# Patient Record
Sex: Female | Born: 1990
Health system: Southern US, Community
[De-identification: ages and names within clinical notes are randomized; demographics above are authoritative.]

## PROBLEM LIST (undated history)

## (undated) DIAGNOSIS — D649 Anemia, unspecified: Secondary | ICD-10-CM

## (undated) DIAGNOSIS — D509 Iron deficiency anemia, unspecified: Secondary | ICD-10-CM

## (undated) DIAGNOSIS — Z8619 Personal history of other infectious and parasitic diseases: Secondary | ICD-10-CM

## (undated) DIAGNOSIS — E611 Iron deficiency: Secondary | ICD-10-CM

## (undated) DIAGNOSIS — O1490 Unspecified pre-eclampsia, unspecified trimester: Secondary | ICD-10-CM

## (undated) DIAGNOSIS — Z87440 Personal history of urinary (tract) infections: Secondary | ICD-10-CM

## (undated) DIAGNOSIS — Z331 Pregnant state, incidental: Principal | ICD-10-CM

## (undated) DIAGNOSIS — Z8759 Personal history of other complications of pregnancy, childbirth and the puerperium: Secondary | ICD-10-CM

## (undated) DIAGNOSIS — B379 Candidiasis, unspecified: Secondary | ICD-10-CM

## (undated) DIAGNOSIS — A499 Bacterial infection, unspecified: Secondary | ICD-10-CM

## (undated) HISTORY — DX: Candidiasis, unspecified: B37.9

## (undated) HISTORY — DX: Pregnant state, incidental: Z33.1

## (undated) HISTORY — DX: Personal history of other infectious and parasitic diseases: Z86.19

## (undated) HISTORY — DX: Iron deficiency: E61.1

## (undated) HISTORY — DX: Personal history of urinary (tract) infections: Z87.440

## (undated) HISTORY — DX: Bacterial infection, unspecified: A49.9

## (undated) HISTORY — PX: WISDOM TOOTH EXTRACTION: SHX21

## (undated) HISTORY — DX: Anemia, unspecified: D64.9

## (undated) HISTORY — DX: Unspecified pre-eclampsia, unspecified trimester: O14.90

---
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
## 2008-05-25 ENCOUNTER — Emergency Department (HOSPITAL_COMMUNITY): Admission: EM | Admit: 2008-05-25 | Discharge: 2008-05-26 | Payer: Self-pay | Admitting: Emergency Medicine

## 2009-11-26 ENCOUNTER — Emergency Department (HOSPITAL_COMMUNITY): Admission: EM | Admit: 2009-11-26 | Discharge: 2009-11-26 | Payer: Self-pay | Admitting: Family Medicine

## 2011-02-07 ENCOUNTER — Emergency Department (HOSPITAL_COMMUNITY)
Admission: EM | Admit: 2011-02-07 | Discharge: 2011-02-07 | Disposition: A | Discharge status: Home / Self Care | Payer: Self-pay | Attending: Emergency Medicine | Admitting: Emergency Medicine

## 2011-02-07 ENCOUNTER — Emergency Department (HOSPITAL_COMMUNITY): Payer: Self-pay

## 2011-02-07 DIAGNOSIS — R0602 Shortness of breath: Secondary | ICD-10-CM | POA: Insufficient documentation

## 2011-02-07 DIAGNOSIS — R05 Cough: Secondary | ICD-10-CM | POA: Insufficient documentation

## 2011-02-07 DIAGNOSIS — R059 Cough, unspecified: Secondary | ICD-10-CM | POA: Insufficient documentation

## 2011-02-07 DIAGNOSIS — J40 Bronchitis, not specified as acute or chronic: Secondary | ICD-10-CM | POA: Insufficient documentation

## 2011-02-07 DIAGNOSIS — R062 Wheezing: Secondary | ICD-10-CM | POA: Insufficient documentation

## 2011-07-16 ENCOUNTER — Encounter (INDEPENDENT_AMBULATORY_CARE_PROVIDER_SITE_OTHER): Payer: Commercial Managed Care - PPO

## 2011-07-16 DIAGNOSIS — Z3201 Encounter for pregnancy test, result positive: Secondary | ICD-10-CM

## 2011-07-16 DIAGNOSIS — Z331 Pregnant state, incidental: Secondary | ICD-10-CM

## 2011-07-16 DIAGNOSIS — D649 Anemia, unspecified: Secondary | ICD-10-CM | POA: Insufficient documentation

## 2011-07-16 LAB — OB RESULTS CONSOLE ANTIBODY SCREEN: Antibody Screen: NEGATIVE

## 2011-07-16 LAB — OB RESULTS CONSOLE RUBELLA ANTIBODY, IGM: Rubella: IMMUNE

## 2011-07-16 LAB — OB RESULTS CONSOLE RPR: RPR: NONREACTIVE

## 2011-07-16 LAB — OB RESULTS CONSOLE HIV ANTIBODY (ROUTINE TESTING): HIV: NONREACTIVE

## 2011-07-16 LAB — OB RESULTS CONSOLE ABO/RH: RH Type: POSITIVE

## 2011-07-16 LAB — OB RESULTS CONSOLE HEPATITIS B SURFACE ANTIGEN: Hepatitis B Surface Ag: NEGATIVE

## 2011-07-30 ENCOUNTER — Encounter (INDEPENDENT_AMBULATORY_CARE_PROVIDER_SITE_OTHER): Payer: Commercial Managed Care - PPO | Admitting: Obstetrics and Gynecology

## 2011-07-30 ENCOUNTER — Encounter (INDEPENDENT_AMBULATORY_CARE_PROVIDER_SITE_OTHER): Payer: Commercial Managed Care - PPO

## 2011-07-30 DIAGNOSIS — O26849 Uterine size-date discrepancy, unspecified trimester: Secondary | ICD-10-CM

## 2011-07-30 DIAGNOSIS — E669 Obesity, unspecified: Secondary | ICD-10-CM

## 2011-07-30 DIAGNOSIS — Z91040 Latex allergy status: Secondary | ICD-10-CM | POA: Insufficient documentation

## 2011-07-30 DIAGNOSIS — O99019 Anemia complicating pregnancy, unspecified trimester: Secondary | ICD-10-CM

## 2011-07-30 DIAGNOSIS — O99210 Obesity complicating pregnancy, unspecified trimester: Secondary | ICD-10-CM

## 2011-08-12 ENCOUNTER — Encounter (INDEPENDENT_AMBULATORY_CARE_PROVIDER_SITE_OTHER): Payer: Commercial Managed Care - PPO | Admitting: Obstetrics and Gynecology

## 2011-08-12 DIAGNOSIS — N898 Other specified noninflammatory disorders of vagina: Secondary | ICD-10-CM

## 2011-08-12 DIAGNOSIS — Z331 Pregnant state, incidental: Secondary | ICD-10-CM

## 2011-08-12 LAB — GC/CHLAMYDIA PROBE AMP, GENITAL
Chlamydia: NEGATIVE
Gonorrhea: NEGATIVE

## 2011-08-12 LAB — PAP IG, CT-NG, RFX HPV ASCU: Pap: NEGATIVE

## 2011-09-04 DIAGNOSIS — R638 Other symptoms and signs concerning food and fluid intake: Secondary | ICD-10-CM

## 2011-09-04 DIAGNOSIS — Z91040 Latex allergy status: Secondary | ICD-10-CM

## 2011-09-04 DIAGNOSIS — D649 Anemia, unspecified: Secondary | ICD-10-CM

## 2011-09-05 ENCOUNTER — Ambulatory Visit (INDEPENDENT_AMBULATORY_CARE_PROVIDER_SITE_OTHER): Payer: Commercial Managed Care - PPO | Admitting: Obstetrics and Gynecology

## 2011-09-05 ENCOUNTER — Other Ambulatory Visit: Payer: Commercial Managed Care - PPO

## 2011-09-05 ENCOUNTER — Encounter: Payer: Self-pay | Admitting: Obstetrics and Gynecology

## 2011-09-05 VITALS — BP 100/62 | Ht 62.0 in | Wt 208.0 lb

## 2011-09-05 DIAGNOSIS — E049 Nontoxic goiter, unspecified: Secondary | ICD-10-CM

## 2011-09-05 DIAGNOSIS — Z331 Pregnant state, incidental: Secondary | ICD-10-CM

## 2011-09-05 LAB — TSH: TSH: 1.738 u[IU]/mL (ref 0.350–4.500)

## 2011-09-05 NOTE — Progress Notes (Signed)
Chloe Graves [redacted]w[redacted]d   Doing well No ctx, VB or LOF  Plan: rv'd quad screen option, pt will decide after anat [place] 2wks anat [place] and early 1hr gtt NV

## 2011-09-05 NOTE — Progress Notes (Signed)
Pt c/o swelling in feet and pelvic pain.

## 2011-09-05 NOTE — Patient Instructions (Signed)
Morning Sickness Morning sickness is when you feel sick to your stomach (nauseous) during pregnancy. This nauseous feeling may or may not come with throwing up (vomiting). It often occurs in the morning, but can be a problem any time of day. While morning sickness is unpleasant, it is usually harmless unless you develop severe and continual vomiting (hyperemesis gravidarum). This condition requires more intense treatment. CAUSES  The cause of morning sickness is not completely known but seems to be related to a sudden increase of two hormones:   Human chorionic gonadotropin (hCG).   Estrogen hormone.  These are elevated in the first part of the pregnancy. TREATMENT  Do not use any medicines (prescription, over-the-counter, or herbal) for morning sickness without first talking to your caregiver. Some patients are helped by the following:  Vitamin B6 (25mg  every 8 hours) or vitamin B6 shots.   An antihistamine called doxylamine (10mg  every 8 hours).   The herbal medication ginger.  HOME CARE INSTRUCTIONS   Taking multivitamins before getting pregnant can prevent or decrease the severity of morning sickness in most women.   Eat a piece of dry toast or unsalted crackers before getting out of bed in the morning.   Eat 5 or 6 small meals a day.   Eat dry and bland foods (rice, baked potato).   Do not drink liquids with your meals. Drink liquids between meals.   Avoid greasy, fatty, and spicy foods.   Get someone to cook for you if the smell of any food causes nausea and vomiting.   Avoid vitamin pills with iron because iron can cause nausea.   Snack on protein foods between meals if you are hungry.   Eat unsweetened gelatins for deserts.   Wear an acupressure wristband (worn for sea sickness) may be helpful.   Acupuncture may be helpful.   Do not smoke.   Get a humidifier to keep the air in your house free of odors.  SEEK MEDICAL CARE IF:   Your home remedies are not working  and you need medication.   You feel dizzy or lightheaded.   You are losing weight.   You need help with your diet.  SEEK IMMEDIATE MEDICAL CARE IF:   You have persistent and uncontrolled nausea and vomiting.   You pass out (faint).   You have a fever.  MAKE SURE YOU:   Understand these instructions.   Will watch your condition.   Will get help right away if you are not doing well or get worse.  Document Released: 06/26/2006 Document Revised: 04/24/2011 Document Reviewed: 04/23/2007 [facility] Patient Information 2012 [place], [place].Preventing Preterm Labor Preterm labor is when a pregnant woman has contractions that cause the cervix to open, shorten, and thin before 37 weeks of pregnancy. You will have regular contractions (tightening) 2 to 3 minutes apart. This usually causes discomfort or pain. HOME CARE  Eat a healthy diet.   Take your vitamins as told by your doctor.   Drink enough fluids to keep your pee (urine) clear or pale yellow every day.   Get rest and sleep.   Do not have sex if you are at high risk for preterm labor.   Follow your doctor's advice about activity, medicines, and tests.   Avoid stress.   Avoid hard labor or exercise that lasts for a long time.   Do not smoke.  GET HELP RIGHT AWAY IF:   You are having contractions.   You have belly (abdominal) pain.   You  have bleeding from your vagina.   You have pain when you pee (urinate).   You have abnormal discharge from your vagina.   You have a temperature by mouth above 102 F (38.9 C).  MAKE SURE YOU:  Understand these instructions.   Will watch your condition.   Will get help if you are not doing well or get worse.  Document Released: 08/01/2008 Document Revised: 04/24/2011 Document Reviewed: 08/01/2008 [facility] Patient Information 2012 [place], [place].

## 2011-09-22 ENCOUNTER — Other Ambulatory Visit: Payer: Self-pay | Admitting: Obstetrics and Gynecology

## 2011-09-22 ENCOUNTER — Ambulatory Visit (INDEPENDENT_AMBULATORY_CARE_PROVIDER_SITE_OTHER): Payer: Commercial Managed Care - PPO | Admitting: Obstetrics and Gynecology

## 2011-09-22 ENCOUNTER — Ambulatory Visit (INDEPENDENT_AMBULATORY_CARE_PROVIDER_SITE_OTHER): Payer: Commercial Managed Care - PPO

## 2011-09-22 ENCOUNTER — Encounter: Payer: Self-pay | Admitting: Obstetrics and Gynecology

## 2011-09-22 VITALS — BP 110/70 | Wt 211.0 lb

## 2011-09-22 DIAGNOSIS — Z331 Pregnant state, incidental: Secondary | ICD-10-CM

## 2011-09-22 DIAGNOSIS — O26849 Uterine size-date discrepancy, unspecified trimester: Secondary | ICD-10-CM

## 2011-09-22 LAB — US OB COMP + 14 WK

## 2011-09-22 LAB — GLUCOSE TOLERANCE, 1 HOUR: Glucose, 1 Hour GTT: 110 mg/dL (ref 70–140)

## 2011-09-22 NOTE — Progress Notes (Signed)
1 gtt given today without difficulty; pt declines quad screen

## 2011-09-22 NOTE — Progress Notes (Signed)
Patient ID: Chloe Graves, female   DOB: April 24, 1991, 21 y.o.   MRN: [id number] Declines quad, has 1 gtt today US anatomy WNL placenta anterior AFI WNL cervix 3.62 Lavera Guise, CNM

## 2011-09-22 NOTE — Progress Notes (Signed)
Addended by: Janeece Agee on: 09/22/2011 09:51 AM   Modules accepted: Orders

## 2011-10-10 ENCOUNTER — Ambulatory Visit (INDEPENDENT_AMBULATORY_CARE_PROVIDER_SITE_OTHER): Payer: Commercial Managed Care - PPO | Admitting: Obstetrics and Gynecology

## 2011-10-10 ENCOUNTER — Encounter: Payer: Self-pay | Admitting: Obstetrics and Gynecology

## 2011-10-10 VITALS — BP 100/60 | Wt 214.0 lb

## 2011-10-10 DIAGNOSIS — Z331 Pregnant state, incidental: Secondary | ICD-10-CM

## 2011-10-10 NOTE — Patient Instructions (Signed)
Preventing Preterm Labor Preterm labor is when a pregnant woman has contractions that cause the cervix to open, shorten, and thin before 37 weeks of pregnancy. You will have regular contractions (tightening) 2 to 3 minutes apart. This usually causes discomfort or pain. HOME CARE  Eat a healthy diet.   Take your vitamins as told by your doctor.   Drink enough fluids to keep your pee (urine) clear or pale yellow every day.   Get rest and sleep.   Do not have sex if you are at high risk for preterm labor.   Follow your doctor's advice about activity, medicines, and tests.   Avoid stress.   Avoid hard labor or exercise that lasts for a long time.   Do not smoke.  GET HELP RIGHT AWAY IF:   You are having contractions.   You have belly (abdominal) pain.   You have bleeding from your vagina.   You have pain when you pee (urinate).   You have abnormal discharge from your vagina.   You have a temperature by mouth above 102 F (38.9 C).  MAKE SURE YOU:  Understand these instructions.   Will watch your condition.   Will get help if you are not doing well or get worse.  Document Released: 08/01/2008 Document Revised: 04/24/2011 Document Reviewed: 08/01/2008 [facility] Patient Information 2012 [place], [place].High Protein Diet A high protein diet means that high protein foods are added to your diet. Getting more protein in the diet is important for a number of reasons. Protein helps the body to build tissue, muscle, and to repair damage. People who have had surgery, injuries such as broken bones, infections, and burns, or illnesses such as cancer, may need more protein in their diet.  SERVING SIZES Measuring foods and serving sizes helps to make sure you are getting the right amount of food. The list below tells how big or small some common serving sizes are.   1 oz.........4 stacked dice.   3 oz........Marland KitchenDeck of cards.   1 tsp.......Marland KitchenTip of little finger.   1 tbs......Marland KitchenMarland KitchenThumb.     2 tbs.......Marland KitchenGolf ball.    cup......Marland KitchenHalf of a fist.   1 cup.......Marland KitchenA fist.  FOOD SOURCES OF PROTEIN Listed below are some food sources of protein and the amount of protein they contain. Your Registered Dietitian can calculate how many grams of protein you need for your medical condition. High protein foods can be added to the diet at mealtime or as snacks. Be sure to have at least 1 protein-containing food at each meal and snack to ensure adequate intake.  Meats and Meat Substitutes / Protein (g)  3 oz poultry (chicken, [place]) / 26 g   3 oz tuna, canned in water / 26 g   3 oz fish (cod) / 21 g   3 oz red meat (beef, pork) / 21 g   4 oz tofu / 9 g   1 egg / 6 g    cup egg substitute / 5 g   1 cup dried beans / 15 g   1 cup soy milk / 4 g  Dairy / Protein (g)  1 cup milk (skim, 1%, 2%, whole) / 8 g    cup evaporated milk / 9 g   1 cup buttermilk / 8 g   1 cup low-fat plain yogurt / 11 g   1 cup regular plain yogurt / 9 g    cup cottage cheese / 14 g   1 oz cheddar cheese /  7 g  Nuts / Protein (g)  2 tbs peanut butter / 8 g   1 oz peanuts / 7 g   2 tbs cashews / 5 g   2 tbs almonds / 5 g  Document Released: 05/05/2005 Document Revised: 04/24/2011 Document Reviewed: 02/05/2007 [facility] Patient Information 2012 [place], [place].

## 2011-10-10 NOTE — Progress Notes (Signed)
[redacted]w[redacted]d rv'd comfort measures - rec maternity belt, works at [employer], empty bladder rv'd diet - regular protein intake PTL precautions Taking CBE and BF classes  1hr gtt NV

## 2011-10-10 NOTE — Progress Notes (Signed)
C/o increased low abd. 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

## 2011-10-24 ENCOUNTER — Telehealth: Payer: Self-pay | Admitting: Obstetrics and Gynecology

## 2011-10-24 NOTE — Telephone Encounter (Signed)
Triage/epic 

## 2011-11-05 ENCOUNTER — Ambulatory Visit (INDEPENDENT_AMBULATORY_CARE_PROVIDER_SITE_OTHER): Payer: 59 | Admitting: Obstetrics and Gynecology

## 2011-11-05 ENCOUNTER — Encounter: Payer: Self-pay | Admitting: Obstetrics and Gynecology

## 2011-11-05 VITALS — BP 100/70 | Temp 98.1°F | Wt 226.0 lb

## 2011-11-05 DIAGNOSIS — O2340 Unspecified infection of urinary tract in pregnancy, unspecified trimester: Secondary | ICD-10-CM

## 2011-11-05 DIAGNOSIS — Z331 Pregnant state, incidental: Secondary | ICD-10-CM

## 2011-11-05 DIAGNOSIS — O2390 Unspecified genitourinary tract infection in pregnancy, unspecified trimester: Secondary | ICD-10-CM

## 2011-11-05 DIAGNOSIS — N390 Urinary tract infection, site not specified: Secondary | ICD-10-CM

## 2011-11-05 LAB — POCT URINALYSIS DIPSTICK
Bilirubin, UA: 1
Blood, UA: NEGATIVE
Glucose, UA: NEGATIVE
Ketones, UA: NEGATIVE
Nitrite, UA: NEGATIVE
Protein, UA: NEGATIVE
Spec Grav, UA: 1.01
Urobilinogen, UA: NEGATIVE
pH, UA: 8

## 2011-11-05 LAB — OB RESULTS CONSOLE GBS: GBS: NEGATIVE

## 2011-11-05 NOTE — Addendum Note (Signed)
Addended by: Tim Lair on: 11/05/2011 10:39 AM   Modules accepted: Orders

## 2011-11-05 NOTE — Progress Notes (Signed)
C/O dizziness, spotted & blurred vision Pt still c/o lower back pain (provided "Easing back pain during pregnancy" pamphlet)

## 2011-11-05 NOTE — Progress Notes (Signed)
Patient ID: Chloe Graves, female   DOB: September 08, 1990, 21 y.o.   MRN: [id number] discussed common discomforts of pg, excessive weight gain, nutrition, and exercise, Reviewed s/s preterm labor, srom, vag bleeding, kick counts to report, enc 8 water daily and frequent voids Lavera Guise, CNM

## 2011-11-05 NOTE — Progress Notes (Signed)
Per Lavera Guise pt needs a 3hr GTT. And wants orders canceled for today 1 GTT.

## 2011-11-06 ENCOUNTER — Telehealth: Payer: Self-pay

## 2011-11-06 LAB — CULTURE, OB URINE
Colony Count: NO GROWTH
Organism ID, Bacteria: NO GROWTH

## 2011-11-07 ENCOUNTER — Telehealth: Payer: Self-pay

## 2011-11-07 ENCOUNTER — Other Ambulatory Visit: Payer: 59

## 2011-11-07 NOTE — Telephone Encounter (Signed)
Pt was called to be made aware that per Vickie L she would not have to do a 3 hr GTT.   And only do a 1hr GTT Pt made me aware she did Glucola early on 09-22-11  Final Result was 110. I told pt I would confirm w/ vickie/ TG if she needed to repeat 1 GTT. Pt home number and boyfriends number are not reachable at this time. Pt can be reached at work at [phone].

## 2011-11-13 ENCOUNTER — Inpatient Hospital Stay (HOSPITAL_COMMUNITY): Payer: 59

## 2011-11-13 ENCOUNTER — Other Ambulatory Visit: Payer: Self-pay | Admitting: Obstetrics and Gynecology

## 2011-11-13 ENCOUNTER — Telehealth: Payer: Self-pay | Admitting: Obstetrics and Gynecology

## 2011-11-13 ENCOUNTER — Ambulatory Visit (INDEPENDENT_AMBULATORY_CARE_PROVIDER_SITE_OTHER): Payer: 59 | Admitting: Obstetrics and Gynecology

## 2011-11-13 ENCOUNTER — Inpatient Hospital Stay (HOSPITAL_COMMUNITY)
Admission: AD | Admit: 2011-11-13 | Discharge: 2011-11-20 | DRG: 765 | Disposition: A | Discharge status: Home / Self Care | Payer: 59 | Source: Ambulatory Visit | Attending: Obstetrics and Gynecology | Admitting: Obstetrics and Gynecology

## 2011-11-13 ENCOUNTER — Encounter (HOSPITAL_COMMUNITY): Payer: Self-pay | Admitting: *Deleted

## 2011-11-13 VITALS — BP 140/90 | Wt 227.0 lb

## 2011-11-13 DIAGNOSIS — O365990 Maternal care for other known or suspected poor fetal growth, unspecified trimester, not applicable or unspecified: Secondary | ICD-10-CM | POA: Diagnosis present

## 2011-11-13 DIAGNOSIS — O139 Gestational [pregnancy-induced] hypertension without significant proteinuria, unspecified trimester: Secondary | ICD-10-CM | POA: Diagnosis present

## 2011-11-13 DIAGNOSIS — O9902 Anemia complicating childbirth: Secondary | ICD-10-CM | POA: Diagnosis present

## 2011-11-13 DIAGNOSIS — Z98891 History of uterine scar from previous surgery: Secondary | ICD-10-CM

## 2011-11-13 DIAGNOSIS — O368190 Decreased fetal movements, unspecified trimester, not applicable or unspecified: Secondary | ICD-10-CM

## 2011-11-13 DIAGNOSIS — Z364 Encounter for antenatal screening for fetal growth retardation: Secondary | ICD-10-CM

## 2011-11-13 DIAGNOSIS — D649 Anemia, unspecified: Secondary | ICD-10-CM | POA: Diagnosis present

## 2011-11-13 DIAGNOSIS — Z331 Pregnant state, incidental: Secondary | ICD-10-CM

## 2011-11-13 DIAGNOSIS — O1414 Severe pre-eclampsia complicating childbirth: Secondary | ICD-10-CM | POA: Diagnosis present

## 2011-11-13 DIAGNOSIS — O1415 Severe pre-eclampsia, complicating the puerperium: Secondary | ICD-10-CM | POA: Diagnosis not present

## 2011-11-13 DIAGNOSIS — O1490 Unspecified pre-eclampsia, unspecified trimester: Secondary | ICD-10-CM | POA: Diagnosis present

## 2011-11-13 DIAGNOSIS — O4100X0 Oligohydramnios, unspecified trimester, not applicable or unspecified: Principal | ICD-10-CM | POA: Diagnosis present

## 2011-11-13 LAB — URINALYSIS, ROUTINE W REFLEX MICROSCOPIC
Bilirubin Urine: NEGATIVE
Glucose, UA: NEGATIVE mg/dL
Hgb urine dipstick: NEGATIVE
Ketones, ur: NEGATIVE mg/dL
Leukocytes, UA: NEGATIVE
Nitrite: NEGATIVE
Protein, ur: NEGATIVE mg/dL
Specific Gravity, Urine: <1.005 — ABNORMAL LOW (ref 1.005–1.030)
Urobilinogen, UA: 0.2 mg/dL (ref 0.0–1.0)
pH: 6 (ref 5.0–8.0)

## 2011-11-13 LAB — COMPREHENSIVE METABOLIC PANEL
ALT: 11 U/L (ref 0–35)
AST: 16 U/L (ref 0–37)
Albumin: 2.5 g/dL — ABNORMAL LOW (ref 3.5–5.2)
Alkaline Phosphatase: 121 U/L — ABNORMAL HIGH (ref 39–117)
BUN: 7 mg/dL (ref 6–23)
CO2: 23 mEq/L (ref 19–32)
Calcium: 8.9 mg/dL (ref 8.4–10.5)
Chloride: 104 mEq/L (ref 96–112)
Creatinine, Ser: 0.8 mg/dL (ref 0.50–1.10)
GFR calc Af Amer: >90 mL/min (ref >90)
GFR calc non Af Amer: >90 mL/min (ref >90)
Glucose, Bld: 82 mg/dL (ref 70–99)
Potassium: 3.8 mEq/L (ref 3.5–5.1)
Sodium: 135 mEq/L (ref 135–145)
Total Bilirubin: 0.1 mg/dL — ABNORMAL LOW (ref 0.3–1.2)
Total Protein: 6.4 g/dL (ref 6.0–8.3)

## 2011-11-13 LAB — CBC
HCT: 35.7 % — ABNORMAL LOW (ref 36.0–46.0)
Hemoglobin: 11.7 g/dL — ABNORMAL LOW (ref 12.0–15.0)
MCH: 26.1 pg (ref 26.0–34.0)
MCHC: 32.8 g/dL (ref 30.0–36.0)
MCV: 79.5 fL (ref 78.0–100.0)
Platelets: 299 10*3/uL (ref 150–400)
RBC: 4.49 MIL/uL (ref 3.87–5.11)
RDW: 15.5 % (ref 11.5–15.5)
WBC: 9.1 10*3/uL (ref 4.0–10.5)

## 2011-11-13 LAB — AMNISURE RUPTURE OF MEMBRANE (ROM) NOT AT ARMC: Amnisure ROM: NEGATIVE

## 2011-11-13 LAB — URIC ACID: Uric Acid, Serum: 6.4 mg/dL (ref 2.4–7.0)

## 2011-11-13 LAB — LACTATE DEHYDROGENASE: LDH: 210 U/L (ref 94–250)

## 2011-11-13 MED ORDER — CALCIUM CARBONATE ANTACID 500 MG PO CHEW: 2.0000 | CHEWABLE_TABLET | ORAL | Status: DC | PRN

## 2011-11-13 MED ORDER — PRENATAL MULTIVITAMIN CH
1.0000 | ORAL_TABLET | Freq: Every day | ORAL | Status: DC
  Administered 2011-11-14 – 2011-11-15 (×2): 1 via ORAL
  Filled 2011-11-13: qty 1

## 2011-11-13 MED ORDER — ACETAMINOPHEN 325 MG PO TABS
650.0000 mg | ORAL_TABLET | ORAL | Status: DC | PRN
  Administered 2011-11-14 – 2011-11-16 (×5): 650 mg via ORAL
  Filled 2011-11-13 (×7): qty 2

## 2011-11-13 MED ORDER — BETAMETHASONE SOD PHOS & ACET 6 (3-3) MG/ML IJ SUSP
12.0000 mg | INTRAMUSCULAR | Status: AC
  Administered 2011-11-13 – 2011-11-14 (×2): 12 mg via INTRAMUSCULAR
  Filled 2011-11-13 (×2): qty 2

## 2011-11-13 MED ORDER — LACTATED RINGERS IV SOLN
INTRAVENOUS | Status: DC
  Administered 2011-11-13 – 2011-11-14 (×4): via INTRAVENOUS
  Administered 2011-11-15: 125 mL/h via INTRAVENOUS
  Administered 2011-11-15 – 2011-11-16 (×2): via INTRAVENOUS

## 2011-11-13 MED ORDER — DOCUSATE SODIUM 100 MG PO CAPS
100.0000 mg | ORAL_CAPSULE | Freq: Every day | ORAL | Status: DC
  Administered 2011-11-14 – 2011-11-15 (×2): 100 mg via ORAL
  Filled 2011-11-13: qty 1

## 2011-11-13 MED ORDER — ZOLPIDEM TARTRATE 5 MG PO TABS
5.0000 mg | ORAL_TABLET | Freq: Every evening | ORAL | Status: DC | PRN
  Administered 2011-11-13 – 2011-11-15 (×3): 5 mg via ORAL
  Filled 2011-11-13 (×3): qty 1

## 2011-11-13 NOTE — Progress Notes (Signed)
Pt has not felt baby move since 1:40am. C/O:Lower back Pain, headaches. Repeat 1 GTT today.

## 2011-11-13 NOTE — H&P (Signed)
Chloe Graves is a 21 y.o. female, G2P0010 at 51 weeks, presenting for admission due to gestational hypertension, IUGR vs SGA, and oligohydramnios.  Seen at [facility] today for decreased FM, with non-reactive NST and BP 140/90. In MAU, NST remained non-reactive, with BPP 6/8 (no FBM), AFI 9.26, 5%ile, and EFW 690gm, 1+8, 13%ile.  Cervix 3.25.  PIH labs were essentially WNL, but uric acid 6.4.  UA negative for protein.  She denies any leaking, contractions, or any other obstetrical issue.  She is admitted for continuous EFM, betamethasone course, repeat BPP/AFI with dopplers, IV fluids, 24 hour urine, and BP monitoring.  History of present pregnancy: Patient entered care at 37 6/7 weeks.  EDC of 02/12/12 was established by 11 week [place].  She declined genetic screenings.  Anatomy scan was done at 18 weeks, with normal findings and an anterior placenta.  Her prenatal course has been essentially uncomplicated.     Pregnancy remarkable for: Patient Active Problem List  Diagnosis  . Latex allergy  . Increased BMI 37.7   . Anemia  . Normal pregnancy, incidental  . Gestational hypertension  . SGA (small for gestational age)  . Oligohydramnios     OB History    Grav Para Term Preterm Abortions TAB SAB Ect Mult Living   2    1         #1--2012 TAB  #2--Current  Past Medical History  Diagnosis Date  . H/O candidiasis   . H/O varicella   . H/O rubella   . H/O cystitis   . Low iron   . Bacterial infection   . Yeast infection   . Anemia   . Normal pregnancy, incidental 09/22/2011  Some issues with constipation vs IBS--no clear diagnosis.  Past Surgical History  Procedure Date  . Wisdom tooth extraction    Family History: family history includes Cancer in her maternal aunt and maternal grandmother.MA seizures.  Cousin lupus.  MA breast cancer, MGF leukemia.    Social History:  reports that she quit smoking about 5 months ago. She has never used smokeless tobacco. She reports that she does not  drink alcohol or use illicit drugs. FOB is involved and supportive--Marquise Klus.  Patient is college-educated, of the [place] faith, African-American, and employed as a Psychologist, sport and exercise at [employer].  FOB has his GED and is employed in house maintenance.  Allergies:  LATEX--itching  ROS:  No HA, occasional visual spotting.  Positive FM.   Blood pressure 145/90, pulse 61, temperature 98.7 F (37.1 C), temperature source Oral, resp. rate 20, height 5\' 1"  (1.549 m), weight 227 lb 9.6 oz (103.239 kg).  Filed Vitals:   11/13/11 1618 11/13/11 1633 11/13/11 1734 11/13/11 1748  BP: 146/80 124/76 148/90 145/90  Pulse: 63 65 63 61  Temp:      TempSrc:      Resp:      Height:      Weight:         Chest clear Heart RRR without murmur Abd gravid, NT, FH approx 26 cm Pelvic--deferred except for amnisure Ext--DTR 1+, trace/1+ bilaterally  FHR--non-reactive, baseline 150-155, no decels No contractions.  Results for orders placed during the hospital encounter of 11/13/11 (from the past 24 hour(s))  URINALYSIS, ROUTINE W REFLEX MICROSCOPIC     Status: Abnormal   Collection Time   11/13/11  3:50 PM      Component Value Range   Color, Urine STRAW (*) YELLOW   APPearance CLEAR  CLEAR   Specific  Gravity, Urine <1.005 (*) 1.005 - 1.030   pH 6.0  5.0 - 8.0   Glucose, UA NEGATIVE  NEGATIVE mg/dL   Hgb urine dipstick NEGATIVE  NEGATIVE   Bilirubin Urine NEGATIVE  NEGATIVE   Ketones, ur NEGATIVE  NEGATIVE mg/dL   Protein, ur NEGATIVE  NEGATIVE mg/dL   Urobilinogen, UA 0.2  0.0 - 1.0 mg/dL   Nitrite NEGATIVE  NEGATIVE   Leukocytes, UA NEGATIVE  NEGATIVE  CBC     Status: Abnormal   Collection Time   11/13/11  3:59 PM      Component Value Range   WBC 9.1  4.0 - 10.5 K/uL   RBC 4.49  3.87 - 5.11 MIL/uL   Hemoglobin 11.7 (*) 12.0 - 15.0 g/dL   HCT 21.3 (*) 08.6 - 57.8 %   MCV 79.5  78.0 - 100.0 fL   MCH 26.1  26.0 - 34.0 pg   MCHC 32.8  30.0 - 36.0 g/dL   RDW 46.9  62.9 - 52.8 %   Platelets 299   150 - 400 K/uL  COMPREHENSIVE METABOLIC PANEL     Status: Abnormal   Collection Time   11/13/11  3:59 PM      Component Value Range   Sodium 135  135 - 145 mEq/L   Potassium 3.8  3.5 - 5.1 mEq/L   Chloride 104  96 - 112 mEq/L   CO2 23  19 - 32 mEq/L   Glucose, Bld 82  70 - 99 mg/dL   BUN 7  6 - 23 mg/dL   Creatinine, Ser 4.13  0.50 - 1.10 mg/dL   Calcium 8.9  8.4 - 24.4 mg/dL   Total Protein 6.4  6.0 - 8.3 g/dL   Albumin 2.5 (*) 3.5 - 5.2 g/dL   AST 16  0 - 37 U/L   ALT 11  0 - 35 U/L   Alkaline Phosphatase 121 (*) 39 - 117 U/L   Total Bilirubin 0.1 (*) 0.3 - 1.2 mg/dL   GFR calc non Af Amer >90  >90 mL/min   GFR calc Af Amer >90  >90 mL/min  URIC ACID     Status: Normal   Collection Time   11/13/11  3:59 PM      Component Value Range   Uric Acid, Serum 6.4  2.4 - 7.0 mg/dL  LACTATE DEHYDROGENASE     Status: Normal   Collection Time   11/13/11  3:59 PM      Component Value Range   LDH 210  94 - 250 U/L   Amnisure negative.  Prenatal labs: ABO, Rh:  A+ Antibody:  Neg Rubella:  Immune RPR:  NR HBsAg:   Neg HIV:   Neg GBS:   NA Hgb 11.9 at NOB Pap WNL 3/13 Cultures negative 3/13 Sickle cell negative  Assessment/Plan: IUP at [redacted] weeks Gestational hypertension IUGR vs SGA Oligohydramnios  Plan: Admit to [facility] Antenatal Unit per consult with Dr. Su Hilt Continuous EFM Betamethasone course Repeat BPP/AFI with doppler in am Repeat PIH labs in am 24 hour urine (began at 5:30pm today) Reviewed plan of care with patient and partner, questions reviewed.  Nigel Bridgeman 11/13/2011, 6:50 PM  Agree with above

## 2011-11-13 NOTE — Progress Notes (Signed)
The patient complains of decreased fetal movement.  Complains of headache.  No blurred vision or right upper quadrant tenderness Abdomen nontender.  Reflexes normal. NST nonreactive.  Unable to do ultrasound in our office. Repeat blood pressure 142/100. To [facility] for evaluation. Dr. Stefano Gaul

## 2011-11-13 NOTE — Telephone Encounter (Signed)
Spoke with pt rgd concerns pt c/o headache blurred visions seeing black spots swelling in hands and feet elevated BP 155/108 in L arm, 146/116 in R arm decrease fetal movement pt has appt with AVS today at 1:30 for eval pt voice understanding

## 2011-11-13 NOTE — MAU Provider Note (Signed)
History   21 yo G2P0010 at 27 weeks presented from the office after evaluation for decreased FM--had non-reactive NST, and BP 140/90.  Denies HA, had mild scotomata yesterday.  Denies epigastric pain, leaking, bleeding, or contractions.  Decreased FM was noted last night and today.  Denies N/V.  Pregnancy remarkable for: Patient Active Problem List  Diagnosis  . Latex allergy  . Increased BMI 37.7   . Anemia  . Normal pregnancy, incidental  Patient is a Licensed conveyancer at [employer].   Chief Complaint  Patient presents with  . Hypertension     OB History    Grav Para Term Preterm Abortions TAB SAB Ect Mult Living   2    1           Past Medical History  Diagnosis Date  . H/O candidiasis   . H/O varicella   . H/O rubella   . H/O cystitis   . Low iron   . Bacterial infection   . Yeast infection   . Anemia   . Normal pregnancy, incidental 09/22/2011    Past Surgical History  Procedure Date  . Wisdom tooth extraction     Family History  Problem Relation Age of Onset  . Cancer Maternal Aunt   . Cancer Maternal Grandmother     History  Substance Use Topics  . Smoking status: Former Smoker    Quit date: 05/20/2011  . Smokeless tobacco: Never Used  . Alcohol Use: No    Allergies:  Allergies  Allergen Reactions  . Latex Itching    Prescriptions prior to admission  Medication Sig Dispense Refill  . acetaminophen (TYLENOL) 500 MG tablet Take 500 mg by mouth every 6 (six) hours as needed. Headache, bachache      . albuterol (PROVENTIL, VENTOLIN) (5 MG/ML) 0.5% NEBU Take by nebulization continuous. Short of breath      . ondansetron (ZOFRAN) 8 MG tablet Take by mouth every 8 (eight) hours as needed. nausea      . Prenat w/o [id number] (CITRANATAL ASSURE PO) Take by mouth.         Physical Exam   Blood pressure 148/90, pulse 63, temperature 98.7 F (37.1 C), temperature source Oral, resp. rate 20, height 5\' 1"  (1.549 m), weight 227 lb 9.6 oz (103.239  kg).  Chest clear Heart RRR without murmur  Abd gravid, NT Pelvic--deferred Ext DTR1+ without clonus, trace/1+ edema--patient reports this is better than earlier this week.  ED Course  IUPat 27 weeks Hypertension Decreased FM/non-reactive NST  Plan: Check PIH labs  Complete OB US with BPP, AFI UA  Nigel Bridgeman, CNM, [place] 11/13/11 4p

## 2011-11-13 NOTE — MAU Note (Signed)
Pt sent to MAU from MD office for elevated BP & non-reactive NST.

## 2011-11-13 NOTE — Progress Notes (Signed)
SCDs on.

## 2011-11-14 ENCOUNTER — Other Ambulatory Visit: Payer: 59

## 2011-11-14 ENCOUNTER — Inpatient Hospital Stay (HOSPITAL_COMMUNITY): Payer: 59

## 2011-11-14 ENCOUNTER — Encounter: Payer: 59 | Admitting: Obstetrics and Gynecology

## 2011-11-14 LAB — CREATININE CLEARANCE, URINE, 24 HOUR
Collection Interval-CRCL: 24 hours
Creatinine Clearance: 101 mL/min (ref 75–115)
Creatinine, 24H Ur: 957 mg/d (ref 700–1800)
Creatinine, Urine: 61.76 mg/dL
Creatinine: 0.66 mg/dL (ref 0.50–1.10)
Urine Total Volume-CRCL: 1550 mL

## 2011-11-14 LAB — HEMOGLOBIN: Hemoglobin: 11.7 g/dL — ABNORMAL LOW (ref 12.0–15.0)

## 2011-11-14 LAB — CBC
HCT: 37.2 % (ref 36.0–46.0)
Hemoglobin: 12.1 g/dL (ref 12.0–15.0)
MCH: 26 pg (ref 26.0–34.0)
MCHC: 32.5 g/dL (ref 30.0–36.0)
MCV: 80 fL (ref 78.0–100.0)
Platelets: 316 10*3/uL (ref 150–400)
RBC: 4.65 MIL/uL (ref 3.87–5.11)
RDW: 15.4 % (ref 11.5–15.5)
WBC: 10.3 10*3/uL (ref 4.0–10.5)

## 2011-11-14 LAB — PROTEIN, URINE, 24 HOUR
Collection Interval-UPROT: 24 hours
Protein, 24H Urine: 295 mg/d — ABNORMAL HIGH (ref 50–100)
Protein, Urine: 19 mg/dL
Urine Total Volume-UPROT: 1550 mL

## 2011-11-14 LAB — COMPREHENSIVE METABOLIC PANEL
ALT: 10 U/L (ref 0–35)
AST: 14 U/L (ref 0–37)
Albumin: 2.4 g/dL — ABNORMAL LOW (ref 3.5–5.2)
Alkaline Phosphatase: 123 U/L — ABNORMAL HIGH (ref 39–117)
BUN: 7 mg/dL (ref 6–23)
CO2: 22 mEq/L (ref 19–32)
Calcium: 9 mg/dL (ref 8.4–10.5)
Chloride: 104 mEq/L (ref 96–112)
Creatinine, Ser: 0.66 mg/dL (ref 0.50–1.10)
GFR calc Af Amer: >90 mL/min (ref >90)
GFR calc non Af Amer: >90 mL/min (ref >90)
Glucose, Bld: 136 mg/dL — ABNORMAL HIGH (ref 70–99)
Potassium: 4.1 mEq/L (ref 3.5–5.1)
Sodium: 135 mEq/L (ref 135–145)
Total Bilirubin: 0.1 mg/dL — ABNORMAL LOW (ref 0.3–1.2)
Total Protein: 6.4 g/dL (ref 6.0–8.3)

## 2011-11-14 LAB — RPR

## 2011-11-14 LAB — URIC ACID: Uric Acid, Serum: 5.7 mg/dL (ref 2.4–7.0)

## 2011-11-14 LAB — LACTATE DEHYDROGENASE: LDH: 222 U/L (ref 94–250)

## 2011-11-14 MED ORDER — HYDROCODONE-ACETAMINOPHEN 5-325 MG PO TABS
1.0000 | ORAL_TABLET | Freq: Four times a day (QID) | ORAL | Status: DC | PRN
  Administered 2011-11-14 – 2011-11-15 (×2): 1 via ORAL
  Administered 2011-11-16: 2 via ORAL
  Administered 2011-11-16 (×2): 1 via ORAL
  Filled 2011-11-14: qty 2
  Filled 2011-11-14 (×4): qty 1

## 2011-11-14 MED ORDER — HYDROCODONE-ACETAMINOPHEN 10-325 MG PO TABS: 1.0000 | ORAL_TABLET | Freq: Four times a day (QID) | ORAL | Status: DC | PRN

## 2011-11-14 NOTE — Progress Notes (Addendum)
Hospital day # 1 pregnancy at [redacted]w[redacted]d--Gestational hypertension vs pre-eclampsia, IUGR vs SGA, low fluid  S:  Doing well, reports good fetal activity.  Denies HA, visual symptoms, epigastric pain.      Perception of contractions: none      Vaginal bleeding: None       Vaginal discharge:  None  O: BP 160/81  Pulse 59  Temp 97.9 F (36.6 C) (Oral)  Resp 18  Ht 5\' 1"  (1.549 m)  Wt 227 lb 9.6 oz (103.239 kg)  BMI 43.00 kg/m2  Filed Vitals:   11/13/11 2237 11/14/11 0443 11/14/11 0609 11/14/11 0832  BP: 149/83 153/72 160/81   Pulse: 65 59 59   Temp:      TempSrc:      Resp: 18 18 18 18   Height:      Weight:             Fetal tracings:  Overall reassuring--one moderate variable at 5:10am.      Contractions:   None      Uterus non-tender      Extremities: DTR 1+ without clonus, tr/1+ edema, negative Homan's      Chest clear      Heart RRR without murmur          Labs:   CBC    Component Value Date/Time   WBC 10.3 11/14/2011 0510   RBC 4.65 11/14/2011 0510   HGB 12.1 11/14/2011 0510   HCT 37.2 11/14/2011 0510   PLT 316 11/14/2011 0510   MCV 80.0 11/14/2011 0510   MCH 26.0 11/14/2011 0510   MCHC 32.5 11/14/2011 0510   RDW 15.4 11/14/2011 0510   CMP     Component Value Date/Time   NA 135 11/14/2011 0510   K 4.1 11/14/2011 0510   CL 104 11/14/2011 0510   CO2 22 11/14/2011 0510   GLUCOSE 136* 11/14/2011 0510   BUN 7 11/14/2011 0510   CREATININE 0.66 11/14/2011 0510   CALCIUM 9.0 11/14/2011 0510   PROT 6.4 11/14/2011 0510   ALBUMIN 2.4* 11/14/2011 0510   AST 14 11/14/2011 0510   ALT 10 11/14/2011 0510   ALKPHOS 123* 11/14/2011 0510   BILITOT 0.1* 11/14/2011 0510   GFRNONAA >90 11/14/2011 0510   GFRAA >90 11/14/2011 0510   LDH 222 Uric acid 5.7  Per official US report, AFI is low normal at 9.26 (5%ile).        Meds: Betamethasone course  A: [redacted]w[redacted]d with elevated BP, IUGR vs SGA, low normal fluid     Stable  P: Continue current plan of care      Upcoming tests/treatments:  Complete  24 hour urine at 5:30pm today      2nd dose Betamethasone dose due at 7pm today.      Repeat BPP, AFI assessment, with dopplers today      Reviewed plan of care with patient and partner.      MDs will follow  Nigel Bridgeman CNM, [place] 11/14/2011 8:31 AM  Pt seen and examined. BP stable.  No PIH symptoms Awaiting 24 hr urine results Will obtain MFM consult

## 2011-11-14 NOTE — Progress Notes (Signed)
MFM Note  [name] is a 21 year old G66P0 AA female at 27+1 weeks who was admitted yesterday for elevated BPs and decreased FM. Two days ago she noticed edema in her hands and feet. Her BPs were elevated at [facility] and she was seen in the office. There, her repeat BP was 142/100. Overnight and today, her BPs have ranged between 140s-160s/70s-90s with no antihypertensives given. A 24 hour urine for total protein is pending. She c/o an occasional mild headache but denies abdominal pain and shortness of breath. She has no history of hypertension and her early GA BPs were normal.  An ultrasound on admission revealed a singleton pregnancy in cephalic position. No gross structural abnormalities identified. The AFV was in the low normal range with an AFI of 9.3. The EFW was at the 13th %tile; however, all measurements were < 5th %tile. The [facility] is lagging by 4 weeks and the head and FL are lagging by 2 weeks. The UA dopplers were high normal and a BPP was 6/8. A repeat BPP today was 8/8 with an AFI of 8.4. The FHR tracing is appropriate for GA with an occasional mild variable.   HELLP labs have been normal. 24 hour urine is pending.   Assessment: 1) IUP at 27+1 weeks 2) Preeclampsia - severe based on BPs (160+ x 2) and fetal growth restriction 3) Fetal growth restriction - not technically < 10th %tile, however all measurements are < 10th %tile and the AC is 4 weeks behind 4) Receiving course of BMZ  Recommendations: 1) Would not give magnesium sulfate now that she has had an initial period of evaluation 2) Give antihypertensives to keep BPs 140s-155/90-100 3) Continuous monitoring for first 48 hours 4) BPPs with any tracing concerns; BPP and UA dopplers twice weekly; growth in 2 weeks 5) Careful recording of I's and O's; twice weekly HELLP labs 6) After 48 hours of BMZ, would not hesitate to deliver by C/S if the following develop: pulmonary edema, persistent HA or visual changes, HELLP syndrome, severe  oligohydramnios, non reassuring heart rate tracing or two BPPs of 4 or less  7) Give magnesium at the time decision is made to deliver   Thank you for the referral.  (Face-to-face consultation with patient: 30 min)

## 2011-11-14 NOTE — Progress Notes (Signed)
UR Chart review completed.  

## 2011-11-15 ENCOUNTER — Inpatient Hospital Stay (HOSPITAL_COMMUNITY): Payer: 59

## 2011-11-15 DIAGNOSIS — O1490 Unspecified pre-eclampsia, unspecified trimester: Secondary | ICD-10-CM | POA: Diagnosis present

## 2011-11-15 MED ORDER — LABETALOL HCL 100 MG PO TABS
100.0000 mg | ORAL_TABLET | Freq: Two times a day (BID) | ORAL | Status: DC
  Administered 2011-11-15 (×2): 100 mg via ORAL
  Filled 2011-11-15 (×3): qty 1

## 2011-11-15 NOTE — Consult Note (Signed)
Neonatology Consult to Antenatal Patient:  [name] was admitted on 6/27 due to [facility], severe IUGR, and oligohydramnios, and is now at 86 2/[redacted] weeks GA . She is currently not having active labor. She has received a course of BMZ. The fetus is female and this is her first child.  I spoke with the patient and several family members. We discussed the worst case of delivery in the next 1-2 days, including usual DR management (do not anticipate any special difficulty in resuscitation based on the size of the infant), possible respiratory complications and need for support, IV access, feedings (mother desires breast feeding, which was encouraged), LOS, Mortality and Morbidity, and long term outcomes. She had several questions which I answered. I offered a NICU tour to any interested family members and would be glad to come back if she has more questions later.  Thank you for asking me to see this patient.  Deatra James, MD Neonatologist  Time spent: 25 minutes

## 2011-11-15 NOTE — Progress Notes (Addendum)
Patient ID: Chloe Graves, female   DOB: 07/23/1990, 21 y.o.   MRN: [id number]  Hospital day # 2 pregnancy at [redacted]w[redacted]d  S: States doing OK.  Had headache earlier this AM which was relieved with percocet.  Also had some "spots in vision" when having headache.  BP also elevated at that time.  States the swelling in her legs is improved this AM but has noted some swelling in her hands this AM.        Reports active fetus.      Denies cramping, leakage of fluid or bleeding.        Labetalol given earlier this AM due to BP.       O: BP 120/70  Pulse 80  Temp 98.6 F (37 C) (Oral)  Resp 20  Ht 5\' 1"  (1.549 m)  Wt 102.422 kg (225 lb 12.8 oz)  BMI 42.66 kg/m2       Filed Vitals:   11/15/11 0550 11/15/11 0803 11/15/11 0814 11/15/11 1119  BP: 158/87 160/94 175/91 120/70  Pulse: 62 74 68 80  Temp: 98 F (36.7 C) 98.4 F (36.9 C) 98.4 F (36.9 C) 98.6 F (37 C)  TempSrc: Oral Oral    Resp: 18 18 20 20   Height:      Weight:           Gen:  Alert, oriented, no apparent distress.      Heart:  RRR      Lungs:  CTA bilat      Abd:  Soft, gravid, NT with pos BS x 4 quads.      Fetal tracings: FHR baseline 145 with minimal to moderate variability present.  10 x 10 accels present.  No decels present.        Toco:  No UCs noted.      Uterus gravid and non-tender      Extremities: extremities normal, atraumatic, no cyanosis or edema, Homans sign is negative, no sign of DVT and no significant edema and no signs of DVT.  Tr edema lower extrems.  DTRs 1+, no clonus  Results for orders placed during the hospital encounter of 11/13/11 (from the past 48 hour(s))  URINALYSIS, ROUTINE W REFLEX MICROSCOPIC     Status: Abnormal   Collection Time   11/13/11  3:50 PM      Component Value Range Comment   Color, Urine STRAW (*) YELLOW    APPearance CLEAR  CLEAR    Specific Gravity, Urine <1.005 (*) 1.005 - 1.030    pH 6.0  5.0 - 8.0    Glucose, UA NEGATIVE  NEGATIVE mg/dL    Hgb urine dipstick NEGATIVE   NEGATIVE    Bilirubin Urine NEGATIVE  NEGATIVE    Ketones, ur NEGATIVE  NEGATIVE mg/dL    Protein, ur NEGATIVE  NEGATIVE mg/dL    Urobilinogen, UA 0.2  0.0 - 1.0 mg/dL    Nitrite NEGATIVE  NEGATIVE    Leukocytes, UA NEGATIVE  NEGATIVE MICROSCOPIC NOT DONE ON URINES WITH NEGATIVE PROTEIN, BLOOD, LEUKOCYTES, NITRITE, OR GLUCOSE <1000 mg/dL.  CBC     Status: Abnormal   Collection Time   11/13/11  3:59 PM      Component Value Range Comment   WBC 9.1  4.0 - 10.5 K/uL    RBC 4.49  3.87 - 5.11 MIL/uL    Hemoglobin 11.7 (*) 12.0 - 15.0 g/dL    HCT 40.9 (*) 81.1 - 46.0 %    MCV 79.5  78.0 -  100.0 fL    MCH 26.1  26.0 - 34.0 pg    MCHC 32.8  30.0 - 36.0 g/dL    RDW 16.1  09.6 - 04.5 %    Platelets 299  150 - 400 K/uL   COMPREHENSIVE METABOLIC PANEL     Status: Abnormal   Collection Time   11/13/11  3:59 PM      Component Value Range Comment   Sodium 135  135 - 145 mEq/L    Potassium 3.8  3.5 - 5.1 mEq/L    Chloride 104  96 - 112 mEq/L    CO2 23  19 - 32 mEq/L    Glucose, Bld 82  70 - 99 mg/dL    BUN 7  6 - 23 mg/dL    Creatinine, Ser 4.09  0.50 - 1.10 mg/dL    Calcium 8.9  8.4 - 81.1 mg/dL    Total Protein 6.4  6.0 - 8.3 g/dL    Albumin 2.5 (*) 3.5 - 5.2 g/dL    AST 16  0 - 37 U/L    ALT 11  0 - 35 U/L    Alkaline Phosphatase 121 (*) 39 - 117 U/L    Total Bilirubin 0.1 (*) 0.3 - 1.2 mg/dL    GFR calc non Af Amer >90  >90 mL/min    GFR calc Af Amer >90  >90 mL/min   URIC ACID     Status: Normal   Collection Time   11/13/11  3:59 PM      Component Value Range Comment   Uric Acid, Serum 6.4  2.4 - 7.0 mg/dL   LACTATE DEHYDROGENASE     Status: Normal   Collection Time   11/13/11  3:59 PM      Component Value Range Comment   LDH 210  94 - 250 U/L   PROTEIN, URINE, 24 HOUR     Status: Abnormal   Collection Time   11/13/11  5:30 PM      Component Value Range Comment   Urine Total Volume-UPROT 1550      Collection Interval-UPROT 24      Protein, Urine 19      Protein, 24H Urine 295  (*) 50 - 100 mg/day   CREATININE CLEARANCE, URINE, 24 HOUR     Status: Normal   Collection Time   11/13/11  5:30 PM      Component Value Range Comment   Urine Total Volume-CRCL 1550      Collection Interval-CRCL 24      Creatinine, Urine 61.76      Creatinine 0.66  0.50 - 1.10 mg/dL    Creatinine, 91Y Ur 782  700 - 1800 mg/day    Creatinine Clearance 101  75 - 115 mL/min   AMNISURE RUPTURE OF MEMBRANE (ROM)     Status: Normal   Collection Time   11/13/11  6:05 PM      Component Value Range Comment   Amnisure ROM NEGATIVE     CBC     Status: Normal   Collection Time   11/14/11  5:10 AM      Component Value Range Comment   WBC 10.3  4.0 - 10.5 K/uL    RBC 4.65  3.87 - 5.11 MIL/uL    Hemoglobin 12.1  12.0 - 15.0 g/dL    HCT 95.6  21.3 - 08.6 %    MCV 80.0  78.0 - 100.0 fL    MCH 26.0  26.0 - 34.0 pg  MCHC 32.5  30.0 - 36.0 g/dL    RDW 16.1  09.6 - 04.5 %    Platelets 316  150 - 400 K/uL   COMPREHENSIVE METABOLIC PANEL     Status: Abnormal   Collection Time   11/14/11  5:10 AM      Component Value Range Comment   Sodium 135  135 - 145 mEq/L    Potassium 4.1  3.5 - 5.1 mEq/L    Chloride 104  96 - 112 mEq/L    CO2 22  19 - 32 mEq/L    Glucose, Bld 136 (*) 70 - 99 mg/dL    BUN 7  6 - 23 mg/dL    Creatinine, Ser 4.09  0.50 - 1.10 mg/dL    Calcium 9.0  8.4 - 81.1 mg/dL    Total Protein 6.4  6.0 - 8.3 g/dL    Albumin 2.4 (*) 3.5 - 5.2 g/dL    AST 14  0 - 37 U/L    ALT 10  0 - 35 U/L    Alkaline Phosphatase 123 (*) 39 - 117 U/L    Total Bilirubin 0.1 (*) 0.3 - 1.2 mg/dL    GFR calc non Af Amer >90  >90 mL/min    GFR calc Af Amer >90  >90 mL/min   URIC ACID     Status: Normal   Collection Time   11/14/11  5:10 AM      Component Value Range Comment   Uric Acid, Serum 5.7  2.4 - 7.0 mg/dL   LACTATE DEHYDROGENASE     Status: Normal   Collection Time   11/14/11  5:10 AM      Component Value Range Comment   LDH 222  94 - 250 U/L    A: [redacted]w[redacted]d with PIH vs Preeclampsia      IUGR/Oligohydramnios/Elevated umbilical artery dopplers  P:Continue current observation.     Repeat ultrasound for BPP/Dopplers scheduled for Monday, 11/17/11.     Plan per MD.  Rhona Leavens., CNM 11/15/2011 12:18 PM  Pt seen she is without c/o.  No chest pain, abdominal pain or HA BP 120/70  Pulse 80  Temp 98.6 F (37 C) (Oral)  Resp 20  Ht 5\' 1"  (1.549 m)  Wt 225 lb 12.8 oz (102.422 kg)  BMI 42.66 kg/m2 FHTS 140-145 moderate variability. toco no ctxs Physical Examination: General appearance - alert, well appearing, and in no distress Mental status - alert, oriented to person, place, and time Chest - clear to auscultation, no wheezes, rales or rhonchi, symmetric air entry Heart - normal rate and regular rhythm Abdomen - soft, nontender, nondistended, no masses or organomegaly Extremities - pedal edema 1 +, 2 plus DTRS.  No clonus or calf tenderness B Plan BPP with dopplers 2 times a week Pt started on labetalol 100mg  BID PIH labs QOD Repeat 24 hour urine in one week NICU consult D/W Dr Sherrie George Pt agrees to c/s if delivery needed.  R&B reviewed with the pt.  Will give pt written info Pt agrees with and understands the plan of care

## 2011-11-16 ENCOUNTER — Inpatient Hospital Stay (HOSPITAL_COMMUNITY): Payer: 59 | Admitting: Anesthesiology

## 2011-11-16 ENCOUNTER — Encounter (HOSPITAL_COMMUNITY): Payer: Self-pay | Admitting: Anesthesiology

## 2011-11-16 ENCOUNTER — Encounter (HOSPITAL_COMMUNITY)
Admission: AD | Disposition: A | Discharge status: Home / Self Care | Payer: Self-pay | Source: Ambulatory Visit | Attending: Obstetrics and Gynecology

## 2011-11-16 ENCOUNTER — Encounter (HOSPITAL_COMMUNITY): Payer: Self-pay | Admitting: *Deleted

## 2011-11-16 DIAGNOSIS — O139 Gestational [pregnancy-induced] hypertension without significant proteinuria, unspecified trimester: Secondary | ICD-10-CM

## 2011-11-16 LAB — CBC WITH DIFFERENTIAL/PLATELET
Basophils Absolute: 0 10*3/uL (ref 0.0–0.1)
Basophils Relative: 0 % (ref 0–1)
Eosinophils Absolute: 0 10*3/uL (ref 0.0–0.7)
Eosinophils Relative: 0 % (ref 0–5)
HCT: 33.6 % — ABNORMAL LOW (ref 36.0–46.0)
Hemoglobin: 11 g/dL — ABNORMAL LOW (ref 12.0–15.0)
Lymphocytes Relative: 23 % (ref 12–46)
Lymphs Abs: 3 10*3/uL (ref 0.7–4.0)
MCH: 26.2 pg (ref 26.0–34.0)
MCHC: 32.7 g/dL (ref 30.0–36.0)
MCV: 80 fL (ref 78.0–100.0)
Monocytes Absolute: 0.7 10*3/uL (ref 0.1–1.0)
Monocytes Relative: 5 % (ref 3–12)
Neutro Abs: 9.5 10*3/uL — ABNORMAL HIGH (ref 1.7–7.7)
Neutrophils Relative %: 72 % (ref 43–77)
Platelets: 266 10*3/uL (ref 150–400)
RBC: 4.2 MIL/uL (ref 3.87–5.11)
RDW: 15.8 % — ABNORMAL HIGH (ref 11.5–15.5)
WBC: 13.2 10*3/uL — ABNORMAL HIGH (ref 4.0–10.5)

## 2011-11-16 LAB — COMPREHENSIVE METABOLIC PANEL
ALT: 10 U/L (ref 0–35)
AST: 17 U/L (ref 0–37)
Albumin: 2.3 g/dL — ABNORMAL LOW (ref 3.5–5.2)
Alkaline Phosphatase: 103 U/L (ref 39–117)
BUN: 7 mg/dL (ref 6–23)
CO2: 23 mEq/L (ref 19–32)
Calcium: 8.5 mg/dL (ref 8.4–10.5)
Chloride: 105 mEq/L (ref 96–112)
Creatinine, Ser: 0.67 mg/dL (ref 0.50–1.10)
GFR calc Af Amer: >90 mL/min (ref >90)
GFR calc non Af Amer: >90 mL/min (ref >90)
Glucose, Bld: 89 mg/dL (ref 70–99)
Potassium: 3.5 mEq/L (ref 3.5–5.1)
Sodium: 137 mEq/L (ref 135–145)
Total Bilirubin: 0.1 mg/dL — ABNORMAL LOW (ref 0.3–1.2)
Total Protein: 5.8 g/dL — ABNORMAL LOW (ref 6.0–8.3)

## 2011-11-16 LAB — URIC ACID: Uric Acid, Serum: 4.9 mg/dL (ref 2.4–7.0)

## 2011-11-16 LAB — MRSA PCR SCREENING: MRSA by PCR: NEGATIVE

## 2011-11-16 LAB — LACTATE DEHYDROGENASE: LDH: 229 U/L (ref 94–250)

## 2011-11-16 SURGERY — Surgical Case
Anesthesia: Spinal | Site: Abdomen | Wound class: Clean Contaminated

## 2011-11-16 MED ORDER — FLEET ENEMA 7-19 GM/118ML RE ENEM: 1.0000 | ENEMA | Freq: Every day | RECTAL | Status: DC | PRN

## 2011-11-16 MED ORDER — CEFAZOLIN SODIUM 1-5 GM-% IV SOLN
INTRAVENOUS | Status: DC | PRN
  Administered 2011-11-16: 2 g via INTRAVENOUS

## 2011-11-16 MED ORDER — LABETALOL HCL 100 MG PO TABS
100.0000 mg | ORAL_TABLET | Freq: Once | ORAL | Status: AC
  Administered 2011-11-16: 100 mg via ORAL
  Filled 2011-11-16: qty 1

## 2011-11-16 MED ORDER — ONDANSETRON HCL 4 MG/2ML IJ SOLN: 4.0000 mg | Freq: Three times a day (TID) | INTRAMUSCULAR | Status: DC | PRN

## 2011-11-16 MED ORDER — ALBUTEROL SULFATE (5 MG/ML) 0.5% IN NEBU
2.5000 mg/h | INHALATION_SOLUTION | Freq: Four times a day (QID) | RESPIRATORY_TRACT | Status: DC | PRN
  Filled 2011-11-16: qty 20

## 2011-11-16 MED ORDER — ONDANSETRON HCL 4 MG/2ML IJ SOLN
4.0000 mg | Freq: Three times a day (TID) | INTRAMUSCULAR | Status: DC
  Administered 2011-11-16: 4 mg via INTRAVENOUS
  Filled 2011-11-16: qty 2

## 2011-11-16 MED ORDER — ONDANSETRON HCL 4 MG/2ML IJ SOLN
INTRAMUSCULAR | Status: AC
  Filled 2011-11-16: qty 2

## 2011-11-16 MED ORDER — ALBUTEROL SULFATE HFA 108 (90 BASE) MCG/ACT IN AERS
2.0000 | INHALATION_SPRAY | RESPIRATORY_TRACT | Status: DC | PRN
  Filled 2011-11-16: qty 6.7

## 2011-11-16 MED ORDER — LABETALOL HCL 200 MG PO TABS
200.0000 mg | ORAL_TABLET | Freq: Once | ORAL | Status: AC
Start: 2011-11-16 — End: 2011-11-16
  Administered 2011-11-16: 200 mg via ORAL
  Filled 2011-11-16: qty 1

## 2011-11-16 MED ORDER — LACTATED RINGERS IV SOLN
INTRAVENOUS | Status: DC | PRN
  Administered 2011-11-16 (×2): via INTRAVENOUS

## 2011-11-16 MED ORDER — KETOROLAC TROMETHAMINE 30 MG/ML IJ SOLN
INTRAMUSCULAR | Status: AC
  Administered 2011-11-16: 30 mg via INTRAVENOUS
  Filled 2011-11-16: qty 1

## 2011-11-16 MED ORDER — LACTATED RINGERS IV SOLN
INTRAVENOUS | Status: DC
  Administered 2011-11-17 (×2): via INTRAVENOUS

## 2011-11-16 MED ORDER — SODIUM CHLORIDE 0.9 % IV SOLN
1.0000 ug/kg/h | INTRAVENOUS | Status: DC | PRN
  Filled 2011-11-16: qty 2.5

## 2011-11-16 MED ORDER — BUPIVACAINE IN DEXTROSE 0.75-8.25 % IT SOLN
INTRATHECAL | Status: DC | PRN
  Administered 2011-11-16: 12 mg via INTRATHECAL

## 2011-11-16 MED ORDER — LANOLIN HYDROUS EX OINT: 1.0000 "application " | TOPICAL_OINTMENT | CUTANEOUS | Status: DC | PRN

## 2011-11-16 MED ORDER — ONDANSETRON HCL 4 MG PO TABS
4.0000 mg | ORAL_TABLET | ORAL | Status: DC | PRN
  Administered 2011-11-19: 4 mg via ORAL
  Filled 2011-11-16: qty 1

## 2011-11-16 MED ORDER — CEFAZOLIN SODIUM 1-5 GM-% IV SOLN
INTRAVENOUS | Status: AC
  Filled 2011-11-16: qty 100

## 2011-11-16 MED ORDER — NALBUPHINE HCL 10 MG/ML IJ SOLN
5.0000 mg | INTRAMUSCULAR | Status: DC | PRN
  Filled 2011-11-16: qty 1

## 2011-11-16 MED ORDER — MAGNESIUM SULFATE 40 G IN LACTATED RINGERS - SIMPLE
2.0000 g/h | INTRAVENOUS | Status: DC
  Administered 2011-11-16 – 2011-11-17 (×2): 2 g/h via INTRAVENOUS
  Filled 2011-11-16 (×2): qty 500

## 2011-11-16 MED ORDER — SENNOSIDES-DOCUSATE SODIUM 8.6-50 MG PO TABS
2.0000 | ORAL_TABLET | Freq: Every day | ORAL | Status: DC
  Administered 2011-11-16 – 2011-11-19 (×4): 2 via ORAL

## 2011-11-16 MED ORDER — LABETALOL HCL 5 MG/ML IV SOLN
20.0000 mg | Freq: Once | INTRAVENOUS | Status: AC
  Administered 2011-11-16: 20 mg via INTRAVENOUS

## 2011-11-16 MED ORDER — MORPHINE SULFATE 0.5 MG/ML IJ SOLN
INTRAMUSCULAR | Status: AC
  Filled 2011-11-16: qty 10

## 2011-11-16 MED ORDER — SODIUM CHLORIDE 0.9 % IR SOLN
Status: DC | PRN
  Administered 2011-11-16: 1000 mL

## 2011-11-16 MED ORDER — TETANUS-DIPHTH-ACELL PERTUSSIS 5-2.5-18.5 LF-MCG/0.5 IM SUSP
0.5000 mL | Freq: Once | INTRAMUSCULAR | Status: AC
  Administered 2011-11-17: 0.5 mL via INTRAMUSCULAR
  Filled 2011-11-16 (×2): qty 0.5

## 2011-11-16 MED ORDER — OXYTOCIN 10 UNIT/ML IJ SOLN
40.0000 [IU] | INTRAVENOUS | Status: DC | PRN
  Administered 2011-11-16: 40 [IU] via INTRAVENOUS

## 2011-11-16 MED ORDER — HYDRALAZINE HCL 20 MG/ML IJ SOLN
10.0000 mg | Freq: Once | INTRAMUSCULAR | Status: DC
  Filled 2011-11-16: qty 1

## 2011-11-16 MED ORDER — MAGNESIUM SULFATE BOLUS VIA INFUSION
4.0000 g | Freq: Once | INTRAVENOUS | Status: DC
  Administered 2011-11-16: 4 g via INTRAVENOUS
  Filled 2011-11-16: qty 500

## 2011-11-16 MED ORDER — DIPHENHYDRAMINE HCL 25 MG PO CAPS: 25.0000 mg | ORAL_CAPSULE | Freq: Four times a day (QID) | ORAL | Status: DC | PRN

## 2011-11-16 MED ORDER — SIMETHICONE 80 MG PO CHEW: 80.0000 mg | CHEWABLE_TABLET | ORAL | Status: DC | PRN

## 2011-11-16 MED ORDER — MEASLES, MUMPS & RUBELLA VAC ~~LOC~~ INJ
0.5000 mL | INJECTION | Freq: Once | SUBCUTANEOUS | Status: DC
  Filled 2011-11-16: qty 0.5

## 2011-11-16 MED ORDER — KETOROLAC TROMETHAMINE 30 MG/ML IJ SOLN: 30.0000 mg | Freq: Four times a day (QID) | INTRAMUSCULAR | Status: AC | PRN

## 2011-11-16 MED ORDER — MEPERIDINE HCL 25 MG/ML IJ SOLN: 6.2500 mg | INTRAMUSCULAR | Status: DC | PRN

## 2011-11-16 MED ORDER — FENTANYL CITRATE 0.05 MG/ML IJ SOLN
INTRAMUSCULAR | Status: DC | PRN
  Administered 2011-11-16: 50 ug via INTRAVENOUS
  Administered 2011-11-16: 25 ug via INTRATHECAL
  Administered 2011-11-16: 25 ug via INTRAVENOUS

## 2011-11-16 MED ORDER — LABETALOL HCL 300 MG PO TABS
300.0000 mg | ORAL_TABLET | Freq: Three times a day (TID) | ORAL | Status: DC
  Administered 2011-11-16 – 2011-11-19 (×7): 300 mg via ORAL
  Filled 2011-11-16 (×9): qty 1

## 2011-11-16 MED ORDER — NALOXONE HCL 0.4 MG/ML IJ SOLN: 0.4000 mg | INTRAMUSCULAR | Status: DC | PRN

## 2011-11-16 MED ORDER — SCOPOLAMINE 1 MG/3DAYS TD PT72
1.0000 | MEDICATED_PATCH | Freq: Once | TRANSDERMAL | Status: AC
  Administered 2011-11-16: 1.5 mg via TRANSDERMAL

## 2011-11-16 MED ORDER — DIPHENHYDRAMINE HCL 50 MG/ML IJ SOLN: 12.5000 mg | INTRAMUSCULAR | Status: DC | PRN

## 2011-11-16 MED ORDER — ZOLPIDEM TARTRATE 5 MG PO TABS
5.0000 mg | ORAL_TABLET | Freq: Every evening | ORAL | Status: DC | PRN
  Administered 2011-11-19: 5 mg via ORAL
  Filled 2011-11-16: qty 1

## 2011-11-16 MED ORDER — ONDANSETRON HCL 4 MG/2ML IJ SOLN
INTRAMUSCULAR | Status: DC | PRN
  Administered 2011-11-16: 4 mg via INTRAVENOUS

## 2011-11-16 MED ORDER — CITRIC ACID-SODIUM CITRATE 334-500 MG/5ML PO SOLN
ORAL | Status: AC
  Administered 2011-11-16: 10:00:00 via OROMUCOSAL
  Filled 2011-11-16: qty 15

## 2011-11-16 MED ORDER — METHYLDOPA 500 MG PO TABS
500.0000 mg | ORAL_TABLET | Freq: Once | ORAL | Status: AC
  Administered 2011-11-16: 500 mg via ORAL
  Filled 2011-11-16: qty 1

## 2011-11-16 MED ORDER — CEFAZOLIN SODIUM-DEXTROSE 2-3 GM-% IV SOLR: 2.0000 g | INTRAVENOUS | Status: DC

## 2011-11-16 MED ORDER — SIMETHICONE 80 MG PO CHEW
80.0000 mg | CHEWABLE_TABLET | Freq: Three times a day (TID) | ORAL | Status: DC
  Administered 2011-11-16 – 2011-11-19 (×12): 80 mg via ORAL

## 2011-11-16 MED ORDER — KETOROLAC TROMETHAMINE 30 MG/ML IJ SOLN
30.0000 mg | Freq: Four times a day (QID) | INTRAMUSCULAR | Status: AC | PRN
  Administered 2011-11-16: 30 mg via INTRAVENOUS

## 2011-11-16 MED ORDER — METOCLOPRAMIDE HCL 5 MG/ML IJ SOLN: 10.0000 mg | Freq: Three times a day (TID) | INTRAMUSCULAR | Status: DC | PRN

## 2011-11-16 MED ORDER — OXYCODONE-ACETAMINOPHEN 5-325 MG PO TABS
1.0000 | ORAL_TABLET | ORAL | Status: DC | PRN
  Administered 2011-11-17 – 2011-11-19 (×5): 1 via ORAL
  Administered 2011-11-19: 2 via ORAL
  Administered 2011-11-19 – 2011-11-20 (×3): 1 via ORAL
  Administered 2011-11-20: 2 via ORAL
  Filled 2011-11-16 (×5): qty 1
  Filled 2011-11-16: qty 2
  Filled 2011-11-16 (×3): qty 1
  Filled 2011-11-16: qty 2

## 2011-11-16 MED ORDER — LABETALOL HCL 5 MG/ML IV SOLN
10.0000 mg | Freq: Once | INTRAVENOUS | Status: AC
  Administered 2011-11-16: 10 mg via INTRAVENOUS
  Filled 2011-11-16: qty 4

## 2011-11-16 MED ORDER — DIPHENHYDRAMINE HCL 25 MG PO CAPS: 25.0000 mg | ORAL_CAPSULE | ORAL | Status: DC | PRN

## 2011-11-16 MED ORDER — LABETALOL HCL 5 MG/ML IV SOLN
20.0000 mg | Freq: Once | INTRAVENOUS | Status: AC
  Administered 2011-11-16: 20 mg via INTRAVENOUS
  Filled 2011-11-16: qty 4

## 2011-11-16 MED ORDER — SCOPOLAMINE 1 MG/3DAYS TD PT72
MEDICATED_PATCH | TRANSDERMAL | Status: AC
  Administered 2011-11-16: 1.5 mg via TRANSDERMAL
  Filled 2011-11-16: qty 1

## 2011-11-16 MED ORDER — IBUPROFEN 600 MG PO TABS
600.0000 mg | ORAL_TABLET | Freq: Four times a day (QID) | ORAL | Status: DC
  Administered 2011-11-16 – 2011-11-20 (×15): 600 mg via ORAL
  Filled 2011-11-16 (×15): qty 1

## 2011-11-16 MED ORDER — FENTANYL CITRATE 0.05 MG/ML IJ SOLN: 25.0000 ug | INTRAMUSCULAR | Status: DC | PRN

## 2011-11-16 MED ORDER — PRENATAL MULTIVITAMIN CH
1.0000 | ORAL_TABLET | Freq: Every day | ORAL | Status: DC
  Administered 2011-11-17 – 2011-11-20 (×4): 1 via ORAL
  Filled 2011-11-16 (×4): qty 1

## 2011-11-16 MED ORDER — OXYTOCIN 40 UNITS IN LACTATED RINGERS INFUSION - SIMPLE MED
62.5000 mL/h | INTRAVENOUS | Status: AC
  Administered 2011-11-16: 62.5 mL/h via INTRAVENOUS
  Filled 2011-11-16: qty 1000

## 2011-11-16 MED ORDER — OXYTOCIN 10 UNIT/ML IJ SOLN
INTRAMUSCULAR | Status: AC
  Filled 2011-11-16: qty 4

## 2011-11-16 MED ORDER — SODIUM CHLORIDE 0.9 % IJ SOLN: 3.0000 mL | INTRAMUSCULAR | Status: DC | PRN

## 2011-11-16 MED ORDER — ONDANSETRON HCL 4 MG/2ML IJ SOLN: 4.0000 mg | INTRAMUSCULAR | Status: DC | PRN

## 2011-11-16 MED ORDER — LABETALOL HCL 200 MG PO TABS
400.0000 mg | ORAL_TABLET | Freq: Four times a day (QID) | ORAL | Status: DC
  Administered 2011-11-16 (×2): 100 mg via ORAL
  Filled 2011-11-16 (×6): qty 2

## 2011-11-16 MED ORDER — FENTANYL CITRATE 0.05 MG/ML IJ SOLN
INTRAMUSCULAR | Status: AC
  Filled 2011-11-16: qty 2

## 2011-11-16 MED ORDER — MORPHINE SULFATE (PF) 0.5 MG/ML IJ SOLN
INTRAMUSCULAR | Status: DC | PRN
Start: 2011-11-16 — End: 2011-11-16
  Administered 2011-11-16: 4.85 mg via INTRAVENOUS
  Administered 2011-11-16: .15 mg via INTRATHECAL

## 2011-11-16 MED ORDER — WITCH HAZEL-GLYCERIN EX PADS: 1.0000 "application " | MEDICATED_PAD | CUTANEOUS | Status: DC | PRN

## 2011-11-16 MED ORDER — DIPHENHYDRAMINE HCL 50 MG/ML IJ SOLN: 25.0000 mg | INTRAMUSCULAR | Status: DC | PRN

## 2011-11-16 MED ORDER — MENTHOL 3 MG MT LOZG: 1.0000 | LOZENGE | OROMUCOSAL | Status: DC | PRN

## 2011-11-16 MED ORDER — BISACODYL 10 MG RE SUPP: 10.0000 mg | Freq: Every day | RECTAL | Status: DC | PRN

## 2011-11-16 MED ORDER — LACTATED RINGERS IV SOLN
INTRAVENOUS | Status: DC | PRN
  Administered 2011-11-16: 10:00:00 via INTRAVENOUS

## 2011-11-16 MED ORDER — FERROUS SULFATE 325 (65 FE) MG PO TABS
325.0000 mg | ORAL_TABLET | Freq: Two times a day (BID) | ORAL | Status: DC
  Administered 2011-11-16 – 2011-11-20 (×8): 325 mg via ORAL
  Filled 2011-11-16 (×8): qty 1

## 2011-11-16 MED ORDER — DIBUCAINE 1 % RE OINT: 1.0000 "application " | TOPICAL_OINTMENT | RECTAL | Status: DC | PRN

## 2011-11-16 SURGICAL SUPPLY — 45 items
APL SKNCLS STERI-STRIP NONHPOA (GAUZE/BANDAGES/DRESSINGS) ×1
BENZOIN TINCTURE PRP APPL 2/3 (GAUZE/BANDAGES/DRESSINGS) ×2 IMPLANT
CHLORAPREP W/TINT 26ML (MISCELLANEOUS) ×2 IMPLANT
CLOTH BEACON ORANGE TIMEOUT ST (SAFETY) ×2 IMPLANT
CONTAINER PREFILL 10% NBF 15ML (MISCELLANEOUS) IMPLANT
DRAIN JACKSON PRT FLT 10 (DRAIN) IMPLANT
DRESSING TELFA 8X3 (GAUZE/BANDAGES/DRESSINGS) ×1 IMPLANT
DRSG COVADERM 4X10 (GAUZE/BANDAGES/DRESSINGS) ×1 IMPLANT
ELECT REM PT RETURN 9FT ADLT (ELECTROSURGICAL) ×2
ELECTRODE REM PT RTRN 9FT ADLT (ELECTROSURGICAL) ×1 IMPLANT
EVACUATOR SILICONE 100CC (DRAIN) IMPLANT
EXTRACTOR VACUUM M CUP 4 TUBE (SUCTIONS) IMPLANT
GLOVE BIO SURGEON STRL SZ 6.5 (GLOVE) ×1 IMPLANT
GLOVE BIOGEL PI IND STRL 7.0 (GLOVE) ×1 IMPLANT
GLOVE BIOGEL PI INDICATOR 7.0 (GLOVE) ×3
GLOVE SKINSENSE NS SZ6.5 (GLOVE) ×3
GLOVE SKINSENSE NS SZ7.5 (GLOVE) ×1
GLOVE SKINSENSE STRL SZ6.5 (GLOVE) IMPLANT
GLOVE SKINSENSE STRL SZ7.5 (GLOVE) IMPLANT
GOWN PREVENTION PLUS LG XLONG (DISPOSABLE) ×6 IMPLANT
HEMOSTAT SURGICEL 2X3 (HEMOSTASIS) ×1 IMPLANT
KIT ABG SYR 3ML LUER SLIP (SYRINGE) ×1 IMPLANT
NDL HYPO 25X5/8 SAFETYGLIDE (NEEDLE) IMPLANT
NEEDLE HYPO 25X5/8 SAFETYGLIDE (NEEDLE) ×2 IMPLANT
NS IRRIG 1000ML POUR BTL (IV SOLUTION) ×2 IMPLANT
PACK C SECTION WH (CUSTOM PROCEDURE TRAY) ×2 IMPLANT
PAD ABD 7.5X8 STRL (GAUZE/BANDAGES/DRESSINGS) ×1 IMPLANT
RTRCTR C-SECT PINK 25CM LRG (MISCELLANEOUS) IMPLANT
SLEEVE SCD COMPRESS KNEE MED (MISCELLANEOUS) ×1 IMPLANT
STAPLER VISISTAT 35W (STAPLE) IMPLANT
STRIP CLOSURE SKIN 1/2X4 (GAUZE/BANDAGES/DRESSINGS) ×2 IMPLANT
SUT CHROMIC 0 CT 1 (SUTURE) ×2 IMPLANT
SUT MNCRL AB 3-0 PS2 27 (SUTURE) ×1 IMPLANT
SUT PLAIN 2 0 (SUTURE) ×4
SUT PLAIN 2 0 XLH (SUTURE) ×2 IMPLANT
SUT PLAIN ABS 2-0 CT1 27XMFL (SUTURE) ×2 IMPLANT
SUT SILK 2 0 SH (SUTURE) IMPLANT
SUT VIC AB 0 CTX 36 (SUTURE) ×8
SUT VIC AB 0 CTX36XBRD ANBCTRL (SUTURE) ×4 IMPLANT
SUT VIC AB 3-0 CT1 27 (SUTURE) ×2
SUT VIC AB 3-0 CT1 TAPERPNT 27 (SUTURE) IMPLANT
TAPE CLOTH SURG 4X10 WHT LF (GAUZE/BANDAGES/DRESSINGS) ×1 IMPLANT
TOWEL OR 17X24 6PK STRL BLUE (TOWEL DISPOSABLE) ×4 IMPLANT
TRAY FOLEY CATH 14FR (SET/KITS/TRAYS/PACK) ×2 IMPLANT
WATER STERILE IRR 1000ML POUR (IV SOLUTION) ×2 IMPLANT

## 2011-11-16 NOTE — Anesthesia Procedure Notes (Signed)
Spinal  Patient location during procedure: OR Start time: 11/16/2011 9:55 AM Staffing Anesthesiologist: Vadie Principato A. Preanesthetic Checklist Completed: patient identified, site marked, surgical consent, pre-op evaluation, timeout performed, IV checked, risks and benefits discussed and monitors and equipment checked Spinal Block Patient position: sitting Prep: DuraPrep Patient monitoring: heart rate, cardiac monitor, continuous pulse ox and blood pressure Approach: midline Location: L3-4 Injection technique: single-shot Needle Needle type: Sprotte  Needle gauge: 24 G Needle length: 9 cm Needle insertion depth: 7 cm Assessment Sensory level: T4 Additional Notes Patient tolerated procedure well Adequate sensory level.

## 2011-11-16 NOTE — Anesthesia Postprocedure Evaluation (Signed)
  Anesthesia Post-op Note  Patient: Chloe Graves  Procedure(s) Performed: Procedure(s) (LRB): CESAREAN SECTION (N/A)  Patient Location: PACU  Anesthesia Type: Spinal  Level of Consciousness: awake, alert  and oriented  Airway and Oxygen Therapy: Patient Spontanous Breathing  Post-op Pain: none  Post-op Assessment: Post-op Vital signs reviewed, Patient's Cardiovascular Status Stable, Respiratory Function Stable, Patent Airway, No signs of Nausea or vomiting, Pain level controlled, No headache and No backache  Post-op Vital Signs: Reviewed and stable  Complications: No apparent anesthesia complications

## 2011-11-16 NOTE — Anesthesia Preprocedure Evaluation (Signed)
Anesthesia Evaluation  Patient identified by MRN, date of birth, ID band Patient awake    Reviewed: Allergy & Precautions, H&P , Patient's Chart, lab work & pertinent test results  Airway Mallampati: III TM Distance: >3 FB Neck ROM: full    Dental No notable dental hx. (+) Teeth Intact   Pulmonary neg pulmonary ROS,  breath sounds clear to auscultation  Pulmonary exam normal       Cardiovascular hypertension, On Medications negative cardio ROS  Rhythm:regular Rate:Normal     Neuro/Psych negative neurological ROS  negative psych ROS   GI/Hepatic negative GI ROS, Neg liver ROS,   Endo/Other  Morbid obesity  Renal/GU negative Renal ROS  negative genitourinary   Musculoskeletal   Abdominal Normal abdominal exam  (+)   Peds  Hematology negative hematology ROS (+)   Anesthesia Other Findings   Reproductive/Obstetrics (+) Pregnancy                           Anesthesia Physical Anesthesia Plan  ASA: III and Emergent  Anesthesia Plan: Spinal   Post-op Pain Management:    Induction:   Airway Management Planned:   Additional Equipment:   Intra-op Plan:   Post-operative Plan:   Informed Consent: I have reviewed the patients History and Physical, chart, labs and discussed the procedure including the risks, benefits and alternatives for the proposed anesthesia with the patient or authorized representative who has indicated his/her understanding and acceptance.     Plan Discussed with: Anesthesiologist, Surgeon and CRNA  Anesthesia Plan Comments:         Anesthesia Quick Evaluation

## 2011-11-16 NOTE — OR Nursing (Addendum)
Uterus massaged by S. Kalilah Barua Charity fundraiser. 5 cc of blood evacuated from uterus during uterine massage. Cord blood could not be obtained.

## 2011-11-16 NOTE — Transfer of Care (Signed)
Immediate Anesthesia Transfer of Care Note  Patient: Chloe Graves  Procedure(s) Performed: Procedure(s) (LRB): CESAREAN SECTION (N/A)  Patient Location: PACU  Anesthesia Type: Spinal  Level of Consciousness: awake, alert  and oriented  Airway & Oxygen Therapy: Patient Spontanous Breathing  Post-op Assessment: Report given to PACU RN and Post -op Vital signs reviewed and stable  Post vital signs: Reviewed and stable  Complications: No apparent anesthesia complications

## 2011-11-16 NOTE — Progress Notes (Signed)
Hospital day # 3 pregnancy at [redacted]w[redacted]d--Pre-eclampsia, IUGR, oligohydramnios, elevated dopplers, HA  S:  Reports HA back--received Vicodin ii at 6:15am, with diminishing of HA, now back at 6-7/10.  Received po Vicodin around       Perception of contractions: none      Vaginal bleeding: None       Vaginal discharge:  None  O: BP 159/90  Pulse 75  Temp 98.1 F (36.7 C) (Oral)  Resp 18  Ht 5\' 1"  (1.549 m)  Wt 225 lb 12.8 oz (102.422 kg)  BMI 42.66 kg/m2      Fetal tracings:  Reassuring, baseline 150      Contractions:   None      Uterus non-tender      Extremities: extremities normal, atraumatic, no cyanosis or edema, edema 1+, DTR 1+ without clonus and no significant edema and no signs of DVT      Chest clear      Heart RRR without murmur      Filed Vitals:   11/16/11 0552 11/16/11 0602 11/16/11 0612 11/16/11 0637  BP: 169/107 163/93 160/96 159/90  Pulse: 76 73 85 75  Temp:      TempSrc:      Resp:    18  Height:      Weight:       Additional BPs;  187/105 at 8am      Labs:   Results for orders placed during the hospital encounter of 11/13/11 (from the past 24 hour(s))  CBC WITH DIFFERENTIAL     Status: Abnormal   Collection Time   11/16/11  5:27 AM      Component Value Range   WBC 13.2 (*) 4.0 - 10.5 K/uL   RBC 4.20  3.87 - 5.11 MIL/uL   Hemoglobin 11.0 (*) 12.0 - 15.0 g/dL   HCT 57.8 (*) 46.9 - 62.9 %   MCV 80.0  78.0 - 100.0 fL   MCH 26.2  26.0 - 34.0 pg   MCHC 32.7  30.0 - 36.0 g/dL   RDW 52.8 (*) 41.3 - 24.4 %   Platelets 266  150 - 400 K/uL   Neutrophils Relative 72  43 - 77 %   Neutro Abs 9.5 (*) 1.7 - 7.7 K/uL   Lymphocytes Relative 23  12 - 46 %   Lymphs Abs 3.0  0.7 - 4.0 K/uL   Monocytes Relative 5  3 - 12 %   Monocytes Absolute 0.7  0.1 - 1.0 K/uL   Eosinophils Relative 0  0 - 5 %   Eosinophils Absolute 0.0  0.0 - 0.7 K/uL   Basophils Relative 0  0 - 1 %   Basophils Absolute 0.0  0.0 - 0.1 K/uL  LACTATE DEHYDROGENASE     Status: Normal   Collection  Time   11/16/11  5:27 AM      Component Value Range   LDH 229  94 - 250 U/L  COMPREHENSIVE METABOLIC PANEL     Status: Abnormal   Collection Time   11/16/11  5:27 AM      Component Value Range   Sodium 137  135 - 145 mEq/L   Potassium 3.5  3.5 - 5.1 mEq/L   Chloride 105  96 - 112 mEq/L   CO2 23  19 - 32 mEq/L   Glucose, Bld 89  70 - 99 mg/dL   BUN 7  6 - 23 mg/dL   Creatinine, Ser 0.10  0.50 - 1.10 mg/dL  Calcium 8.5  8.4 - 10.5 mg/dL   Total Protein 5.8 (*) 6.0 - 8.3 g/dL   Albumin 2.3 (*) 3.5 - 5.2 g/dL   AST 17  0 - 37 U/L   ALT 10  0 - 35 U/L   Alkaline Phosphatase 103  39 - 117 U/L   Total Bilirubin 0.1 (*) 0.3 - 1.2 mg/dL   GFR calc non Af Amer >90  >90 mL/min   GFR calc Af Amer >90  >90 mL/min  URIC ACID     Status: Normal   Collection Time   11/16/11  5:27 AM      Component Value Range   Uric Acid, Serum 4.9  2.4 - 7.0 mg/dL    Meds: 78:46N Labetalol 100 mg po 3:30am Labetalol 100 mg po 5:23am Labetalol 10 mg IV 6:13am Labetalol 200 mg po 6:15am Labetalol 20 mg IV TOTAL:  400 mg PO, 30 mg IV since 11:30pm  4:46am Tylenol 650 mg 5:20-6:15am--Vicodin 2 tablets 8:22am Vicodin 2 tablets  A: [redacted]w[redacted]d with pre-eclampsia, IUGR, oligohydramnios, elevated dopplers, HA, worsening BP   P:  Consulted with Dr. Mable Fill will see and evaluate the patient.       Labetalol 20 mg IV now, Labetalol 100 mg po now, Aldomet 500 mg now      Upcoming tests/treatments:  Repeat US/dopplers currently scheduled for Monday.      Reviewed with patient the plan of care, including the possibility that she may need to be delivered if BP is not able to           be controlled with current measures.  Nigel Bridgeman CNM, [place] 11/16/2011 8:35 AM

## 2011-11-16 NOTE — Progress Notes (Signed)
Patient ID: Chloe Graves, female   DOB: 20-Dec-1990, 21 y.o.   MRN: [id number] Pt has a sever headache.  No visual changes or abdominal pain BP 200/129  Pulse 86  Temp 98.7 F (37.1 C) (Oral)  Resp 20  Ht 5\' 1"  (1.549 m)  Wt 225 lb 12.8 oz (102.422 kg)  BMI 42.66 kg/m2 Pt received PO aldomet and labetalol.  She has received several doses of IV labetalol.  Her blood pressures continue to rise.  For this reason I have recommended a cesarean section.  She understands the risks are but not limited to bleeding, infection, damage to internal organs like bowel, bladder and major organs.  She also understands that she may be at risk for a classical or T incision which would commit her to cesarean deliveries in the future.  Pt understands and agrees to the procedure.

## 2011-11-16 NOTE — Op Note (Signed)
Cesarean Section Procedure Note   Chloe Graves  11/13/2011 - 11/16/2011  Indications: gestational HTN with poorly controlled blood pressure.  SGA.  [redacted] weeks gestation   Pre-operative Diagnosis: severe gestational hypertention/29 week intrauterine pregnancy.   Post-operative Diagnosis: Same   Surgeon: Surgeon(s) and Role:    * Michael Litter, MD - Primary   Assistants: Manfred Arch CNM  Anesthesia: spinal   Procedure Details:  The patient was seen in the Holding Room. The risks, benefits, complications, treatment options, and expected outcomes were discussed with the patient. The patient concurred with the proposed plan, giving informed consent. identified as [name] and the procedure verified as C-Section Delivery. A Time Out was held and the above information confirmed.  After induction of anesthesia, the patient was draped and prepped in the usual sterile manner. A transverse incision was made and carried down through the subcutaneous tissue to the fascia. Fascial incision was made in the midline and extended transversely. The fascia was separated from the underlying rectus muscle superiorly and inferiorly. The peritoneum was identified and entered. Peritoneal incision was extended longitudinally with good visualization of bowel and bladder.  the alexs retractor was placed into the abdominal cavity.  The utero-vesical peritoneal reflection was incised transversely and the bladder flap was bluntly freed from the lower uterine segment. A low transverse uterine incision was made. Delivered from cephalic presentation was a 1-7 pound infant, with Apgar scores of 6 at one minute and 7 at five minutes. Cord ph was sent the umbilical cord was clamped and cut cord blood was obtained for evaluation. The placenta was removed Intact and appeared normal. The uterine outline, tubes and ovaries appeared normal.  the left fallopian tube had a benign cyst. }. The uterine incision was closed with running  locked sutures of 0Vicryl. A second layer 0 vicrlyl was used to imbricate the uterine incision    Hemostasis was observed. Lavage was carried out until clear.  The alexis was removed The fascia was then reapproximated with running sutures of 0plain gut. The subcuticular closure was performed using 3-45monocryl     Instrument, sponge, and needle counts were correct prior the abdominal closure and were correct at the conclusion of the case.    Findings: female infant in cephalic presentation.  Placenta delivered before the infant.  Clear fluid was noted.  Normal appearing pelvic and abdominal anatomy   Estimated Blood Loss: 500cc   Total IV Fluids:   Urine Output: 350CC OF clear urine  Specimens: placenta to pathology  Complications: no complications  Disposition: PACU - hemodynamically stable.   Maternal Condition: stable.  Will go to ICU on Magnesium sulfate for seizure prophylaxsis   Baby condition / location:  NICU  Attending Attestation: I was present and scrubbed for the entire procedure.   Signed: Surgeon(s): Michael Litter, MD

## 2011-11-17 DIAGNOSIS — IMO0002 Reserved for concepts with insufficient information to code with codable children: Secondary | ICD-10-CM

## 2011-11-17 DIAGNOSIS — IMO0001 Reserved for inherently not codable concepts without codable children: Secondary | ICD-10-CM

## 2011-11-17 LAB — COMPREHENSIVE METABOLIC PANEL
ALT: 12 U/L (ref 0–35)
AST: 20 U/L (ref 0–37)
Albumin: 2 g/dL — ABNORMAL LOW (ref 3.5–5.2)
Alkaline Phosphatase: 94 U/L (ref 39–117)
BUN: 8 mg/dL (ref 6–23)
CO2: 26 mEq/L (ref 19–32)
Calcium: 6.8 mg/dL — ABNORMAL LOW (ref 8.4–10.5)
Chloride: 102 mEq/L (ref 96–112)
Creatinine, Ser: 0.73 mg/dL (ref 0.50–1.10)
GFR calc Af Amer: >90 mL/min (ref >90)
GFR calc non Af Amer: >90 mL/min (ref >90)
Glucose, Bld: 92 mg/dL (ref 70–99)
Potassium: 3.9 mEq/L (ref 3.5–5.1)
Sodium: 135 mEq/L (ref 135–145)
Total Bilirubin: 0.1 mg/dL — ABNORMAL LOW (ref 0.3–1.2)
Total Protein: 5.1 g/dL — ABNORMAL LOW (ref 6.0–8.3)

## 2011-11-17 LAB — CBC
HCT: 30.9 % — ABNORMAL LOW (ref 36.0–46.0)
Hemoglobin: 10.2 g/dL — ABNORMAL LOW (ref 12.0–15.0)
MCH: 26.3 pg (ref 26.0–34.0)
MCHC: 33 g/dL (ref 30.0–36.0)
MCV: 79.6 fL (ref 78.0–100.0)
Platelets: 240 10*3/uL (ref 150–400)
RBC: 3.88 MIL/uL (ref 3.87–5.11)
RDW: 15.9 % — ABNORMAL HIGH (ref 11.5–15.5)
WBC: 12.9 10*3/uL — ABNORMAL HIGH (ref 4.0–10.5)

## 2011-11-17 LAB — MAGNESIUM: Magnesium: 5.2 mg/dL — ABNORMAL HIGH (ref 1.5–2.5)

## 2011-11-17 LAB — URIC ACID: Uric Acid, Serum: 4.9 mg/dL (ref 2.4–7.0)

## 2011-11-17 LAB — LACTATE DEHYDROGENASE: LDH: 279 U/L — ABNORMAL HIGH (ref 94–250)

## 2011-11-17 NOTE — Addendum Note (Signed)
Addendum  created 11/17/11 0759 by Suella Grove, CRNA   Modules edited:Notes Section

## 2011-11-17 NOTE — Progress Notes (Signed)
Pt returned from NICU; ambulating without difficulty

## 2011-11-17 NOTE — Progress Notes (Signed)
1 Day Post-Op Procedure(s) (LRB): CESAREAN SECTION (N/A) for severe pre-eclampsia at 27 + 3 weeks  Subjective: Patient reports that pain is well managed.  Tolerating normal diet as tolerated without difficulty. No nausea / vomiting.  Ambulating and voiding. Mild headache. No visual symptoms.  Objective: BP 125/79  Pulse 83  Temp 98.2 F (36.8 C) (Oral)  Resp 18  Ht 5\' 1"  (1.549 m)  Wt 245 lb 4.8 oz (111.267 kg)  BMI 46.35 kg/m2  SpO2 98%  Breastfeeding? Unknown  I/O: + 10,000 since admission       -275 cc in last 2 hours with diuresis improving rapidly Labs: Results for Chloe Graves, Chloe Graves (MRN [id number]) as of 11/17/2011 11:16  Ref. Range 11/17/2011 05:25  Sodium Latest Range: 135-145 mEq/L 135  Potassium Latest Range: 3.5-5.1 mEq/L 3.9  Chloride Latest Range: 96-112 mEq/L 102  CO2 Latest Range: 19-32 mEq/L 26  BUN Latest Range: 6-23 mg/dL 8  Creat Latest Range: 0.50-1.10 mg/dL 3.08  Calcium Latest Range: 8.4-10.5 mg/dL 6.8 (L)  GFR calc non Af Amer Latest Range: >90 mL/min >90  GFR calc Af Amer Latest Range: >90 mL/min >90  Glucose Latest Range: 70-99 mg/dL 92  Magnesium Latest Range: 1.5-2.5 mg/dL 5.2 (H)  Alkaline Phosphatase Latest Range: 39-117 U/L 94  Albumin Latest Range: 3.5-5.2 g/dL 2.0 (L)  Uric Acid, Serum Latest Range: 2.4-7.0 mg/dL 4.9  AST Latest Range: 0-37 U/L 20  ALT Latest Range: 0-35 U/L 12  Total Protein Latest Range: 6.0-8.3 g/dL 5.1 (L)  Total Bilirubin Latest Range: 0.3-1.2 mg/dL 0.1 (L)  LDH Latest Range: 94-250 U/L 279 (H)  WBC Latest Range: 4.0-10.5 K/uL 12.9 (H)  RBC Latest Range: 3.87-5.11 MIL/uL 3.88  Hemoglobin Latest Range: 12.0-15.0 g/dL 65.7 (L)  HCT Latest Range: 36.0-46.0 % 30.9 (L)  MCV Latest Range: 78.0-100.0 fL 79.6  MCH Latest Range: 26.0-34.0 pg 26.3  MCHC Latest Range: 30.0-36.0 g/dL 84.6  RDW Latest Range: 11.5-15.5 % 15.9 (H)  Platelets Latest Range: 150-400 K/uL 240     BP normal without antihypertensive meds since last  night Lungs: clear Heart: normal rate and rhythm Abdomen:soft and appropriately tender Wound dressing clean Extremities: Homans sign is negative, no sign of DVT, 3+ edema Incision: covered  Assessment: s/p Procedure(s): CESAREAN SECTION: progressing well  Plan: will reevaluate I/O in 4 hours with probable D/C of magnesium sulfate  LOS: 4 days    Braylon Lemmons A 11/17/2011, 11:13 AM

## 2011-11-17 NOTE — Progress Notes (Signed)
UR chart review completed.  

## 2011-11-17 NOTE — Anesthesia Postprocedure Evaluation (Signed)
  Anesthesia Post-op Note  Patient: Chloe Graves  Procedure(s) Performed: Procedure(s) (LRB): CESAREAN SECTION (N/A)  Patient Location: A-ICU  Anesthesia Type: Spinal  Level of Consciousness: awake  Airway and Oxygen Therapy: Patient Spontanous Breathing  Post-op Pain: none  Post-op Assessment: Patient's Cardiovascular Status Stable and Respiratory Function Stable  Post-op Vital Signs: Reviewed and stable  Complications: No apparent anesthesia complications

## 2011-11-18 ENCOUNTER — Encounter (HOSPITAL_COMMUNITY): Payer: Self-pay | Admitting: Obstetrics and Gynecology

## 2011-11-18 LAB — GLUCOSE TOLERANCE, 1 HOUR (50G) W/O FASTING: Glucose, 1 Hour GTT: 77 mg/dL (ref 70–140)

## 2011-11-18 LAB — COMPREHENSIVE METABOLIC PANEL
ALT: 10 U/L (ref 0–35)
AST: 15 U/L (ref 0–37)
Albumin: 2.2 g/dL — ABNORMAL LOW (ref 3.5–5.2)
Alkaline Phosphatase: 88 U/L (ref 39–117)
BUN: 12 mg/dL (ref 6–23)
CO2: 26 mEq/L (ref 19–32)
Calcium: 7.4 mg/dL — ABNORMAL LOW (ref 8.4–10.5)
Chloride: 105 mEq/L (ref 96–112)
Creatinine, Ser: 0.95 mg/dL (ref 0.50–1.10)
GFR calc Af Amer: >90 mL/min (ref >90)
GFR calc non Af Amer: 84 mL/min — ABNORMAL LOW (ref >90)
Glucose, Bld: 79 mg/dL (ref 70–99)
Potassium: 4.1 mEq/L (ref 3.5–5.1)
Sodium: 137 mEq/L (ref 135–145)
Total Bilirubin: 0.1 mg/dL — ABNORMAL LOW (ref 0.3–1.2)
Total Protein: 5.3 g/dL — ABNORMAL LOW (ref 6.0–8.3)

## 2011-11-18 LAB — LACTATE DEHYDROGENASE: LDH: 267 U/L — ABNORMAL HIGH (ref 94–250)

## 2011-11-18 LAB — URIC ACID: Uric Acid, Serum: 5.2 mg/dL (ref 2.4–7.0)

## 2011-11-18 NOTE — Clinical Social Work Maternal (Signed)
    Clinical Social Work Department PSYCHOSOCIAL ASSESSMENT - MATERNAL/CHILD 11/18/2011  Patient:  Chloe Graves, Chloe Graves  Account Number:  [id number]  Admit Date:  11/13/2011  Marjo Bicker Name:   Chloe Graves    Clinical Social Worker:  Lulu Riding, LCSW   Date/Time:  11/18/2011 11:30 AM  Date Referred:  11/18/2011   Referral source  NICU     Referred reason  NICU   Other referral source:    I:  FAMILY / HOME ENVIRONMENT Child's legal guardian:  PARENT  Guardian - Name Guardian - Age Guardian - Address  Chloe Graves 23 on file  Chloe Graves  same   Other household support members/support persons Name Relationship DOB  Pebbles Daphine Deutscher AUNT 60   Other support:    II  PSYCHOSOCIAL DATA Information Source:  Family Interview  Surveyor, quantity and [employer] Employment:   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  FOB works at [employer] and Re[employer] in [employer].   Financial resources:  Media planner If [employer] - [place]:    School / Grade:   Maternity Care Coordinator / Child Services Coordination / Early Interventions:  Cultural issues impacting care:   None known    III  STRENGTHS Strengths  Adequate Resources  Compliance with medical plan  Home prepared for Child (including basic supplies)  Other - See comment  Supportive family/friends  Understanding of illness   Strength comment:  SW gave pediatrician list   IV  RISK FACTORS AND CURRENT PROBLEMS Current Problem:  None   Risk Factor & Current Problem Patient Issue Family Issue Risk Factor / Current Problem Comment   N N     V  SOCIAL WORK ASSESSMENT SW met with parents in MOB's third floor room/317 to introduce myself, complete assessment and evaluate how they are coping with baby's admission to NICU.  Parents were very friendly.  MOB did most of the talking, although FOB was very attentive and involved.   They smiled when SW asked them how they are feeling about being parents.  They  appear to be coping well and state that they have a great support system and have already gotten everything they need for baby at home.  SW informed parents of baby's eligibility for SSI and assisted them in completing the application.  They were appreciative.  SW explained support services offered by NICU SW and [employer] as well as common emotions related to a NICU admission.  SW gave Feelings After Birth handout.  SW discussed what to expect from this experience and gave contact information. SW has no social concerns at this time.      VI SOCIAL WORK PLAN  Type of pt/family education:   Feelings After Birth support group information  Family Support Network information   If child protective services report - county:   If child protective services report - date:   Information/referral to community resources comment:   SSI   Other social work plan:

## 2011-11-18 NOTE — Progress Notes (Addendum)
Subjective: Postpartum Day 2: Cesarean Delivery Patient reports tolerating PO, + flatus and + BM.   No headaches, blurred vision, or right upper quadrant pain.  Objective: Vital signs in last 24 hours: Temp:  [97.9 F (36.6 C)-98.3 F (36.8 C)] 98.1 F (36.7 C) (07/02 0000) Pulse Rate:  [72-93] 78  (07/02 0808) Resp:  [18] 18  (07/02 0808) BP: (96-131)/(51-79) 130/79 mmHg (07/02 0903) SpO2:  [98 %-100 %] 99 % (07/02 0808) Weight:  [111.267 kg (245 lb 4.8 oz)] 111.267 kg (245 lb 4.8 oz) (07/02 0500)  Physical Exam:  General: alert, cooperative and no distress Chest: Clear Heart: Regular rate and rhythm Lochia: appropriate Uterine Fundus: firm. No right upper quadrant tenderness. Incision: no significant drainage DVT Evaluation: No evidence of DVT seen on physical exam. Reflexes normal. No clonus.  CMP     Component Value Date/Time   NA 137 11/18/2011 0520   K 4.1 11/18/2011 0520   CL 105 11/18/2011 0520   CO2 26 11/18/2011 0520   GLUCOSE 79 11/18/2011 0520   BUN 12 11/18/2011 0520   CREATININE 0.95 11/18/2011 0520   CREATININE 0.66 11/13/2011 1730   CALCIUM 7.4* 11/18/2011 0520   PROT 5.3* 11/18/2011 0520   ALBUMIN 2.2* 11/18/2011 0520   AST 15 11/18/2011 0520   ALT 10 11/18/2011 0520   ALKPHOS 88 11/18/2011 0520   BILITOT 0.1* 11/18/2011 0520   GFRNONAA 84* 11/18/2011 0520   GFRAA >90 11/18/2011 0520       Basename 11/17/11 0525 11/16/11 0527  HGB 10.2* 11.0*  HCT 30.9* 33.6*    Assessment/Plan: Status post Cesarean section. Doing well. Pregnancy-induced hypertension/preeclampsia is resolving . Transfer the patient to the postpartum floor.  Plan for discharge tomorrow.  Patient reports that birth control pills caused her to have depression. I recommended that the patient consider ParaGard IUD because she says she does not want another child for 4-5 years.  Johan Creveling V 11/18/2011, 9:36 AM

## 2011-11-19 LAB — COMPREHENSIVE METABOLIC PANEL
ALT: 13 U/L (ref 0–35)
AST: 19 U/L (ref 0–37)
Albumin: 2.1 g/dL — ABNORMAL LOW (ref 3.5–5.2)
Alkaline Phosphatase: 80 U/L (ref 39–117)
BUN: 8 mg/dL (ref 6–23)
CO2: 26 mEq/L (ref 19–32)
Calcium: 7.9 mg/dL — ABNORMAL LOW (ref 8.4–10.5)
Chloride: 109 mEq/L (ref 96–112)
Creatinine, Ser: 0.81 mg/dL (ref 0.50–1.10)
GFR calc Af Amer: >90 mL/min (ref >90)
GFR calc non Af Amer: >90 mL/min (ref >90)
Glucose, Bld: 85 mg/dL (ref 70–99)
Potassium: 4.6 mEq/L (ref 3.5–5.1)
Sodium: 141 mEq/L (ref 135–145)
Total Bilirubin: 0.1 mg/dL — ABNORMAL LOW (ref 0.3–1.2)
Total Protein: 4.8 g/dL — ABNORMAL LOW (ref 6.0–8.3)

## 2011-11-19 LAB — URIC ACID: Uric Acid, Serum: 5 mg/dL (ref 2.4–7.0)

## 2011-11-19 LAB — LACTATE DEHYDROGENASE: LDH: 233 U/L (ref 94–250)

## 2011-11-19 MED ORDER — HYDRALAZINE HCL 20 MG/ML IJ SOLN
5.0000 mg | Freq: Once | INTRAMUSCULAR | Status: AC
  Administered 2011-11-19: 5 mg via INTRAVENOUS

## 2011-11-19 MED ORDER — NIFEDIPINE ER 30 MG PO TB24
30.0000 mg | ORAL_TABLET | Freq: Every day | ORAL | Status: DC
  Administered 2011-11-19: 30 mg via ORAL
  Filled 2011-11-19 (×2): qty 1

## 2011-11-19 MED ORDER — HYDRALAZINE HCL 20 MG/ML IJ SOLN
INTRAMUSCULAR | Status: AC
  Administered 2011-11-19: 5 mg via INTRAVENOUS
  Filled 2011-11-19: qty 1

## 2011-11-19 MED ORDER — NIFEDIPINE ER 60 MG PO TB24
60.0000 mg | ORAL_TABLET | Freq: Every day | ORAL | Status: DC
  Filled 2011-11-19: qty 1

## 2011-11-19 MED ORDER — LABETALOL HCL 5 MG/ML IV SOLN
10.0000 mg | Freq: Once | INTRAVENOUS | Status: AC
  Administered 2011-11-19: 10 mg via INTRAVENOUS
  Filled 2011-11-19: qty 4

## 2011-11-19 MED ORDER — HYDROCHLOROTHIAZIDE 25 MG PO TABS
25.0000 mg | ORAL_TABLET | Freq: Every day | ORAL | Status: DC
  Administered 2011-11-19 – 2011-11-20 (×2): 25 mg via ORAL
  Filled 2011-11-19 (×3): qty 1

## 2011-11-19 MED ORDER — HYDRALAZINE HCL 20 MG/ML IJ SOLN
10.0000 mg | Freq: Once | INTRAMUSCULAR | Status: AC
  Administered 2011-11-19: 10 mg via INTRAVENOUS

## 2011-11-19 NOTE — Progress Notes (Signed)
Called Dr. Stefano Gaul to update him of pts morning blood pressure. 1610: 181/106, 9604: 178/92. See orders.

## 2011-11-19 NOTE — Progress Notes (Signed)
Patient transferred back to AICU room 372 at 0830. BP 212/108 after 5mg  IV Apresoline given, gave 10mg  IV Apresoline then given upon transfer per order and Dr.Haygood notified.

## 2011-11-19 NOTE — Progress Notes (Signed)
2125 - pt. Walked to NICU, accompanied by s.o. To see her baby

## 2011-11-19 NOTE — Progress Notes (Signed)
Subjective: Postpartum Day 3: Cesarean Delivery Patient reports tolerating PO, + flatus, + BM and no problems voiding. C/o sever headache but no visual changes.    Objective: Vital signs in last 24 hours: Temp:  [97.4 F (36.3 C)-98.3 F (36.8 C)] 97.9 F (36.6 C) (07/03 0840) Pulse Rate:  [73-94] 94  (07/03 0900) Resp:  [16-20] 20  (07/03 0840) BP: (126-214)/(76-120) 168/105 mmHg (07/03 0900) SpO2:  [96 %-100 %] 98 % (07/03 0900) Weight:  [238 lb (107.956 kg)-240 lb 4.8 oz (108.999 kg)] 238 lb (107.956 kg) (07/03 0840)  Physical Exam:  General: alert, cooperative, moderate distress, morbidly obese and c/o severe headache.  Started with first dose of  IV labetalol Lungs clear. Heart RRR Lochia: appropriate Uterine Fundus: firm Incision: healing well DVT Evaluation: No evidence of DVT seen on physical exam.   Basename 11/17/11 0525  HGB 10.2*  HCT 30.9*    Assessment/Plan: Status post Cesarean section. Postoperative course complicated by pre eclalmpsia with persistent post partum hypertension  Labetalol therapy no longer providing adequate anti hypertensive relief.  Plan: DC labetalol Begin ProcardiaXL at 30 mg daily with HCTZ. Will increase Procardia as needed to a maximum of 60 mg BID Possible DC home on 11/20/11 Chloe Graves P 11/19/2011, 9:46 AM

## 2011-11-20 DIAGNOSIS — Z98891 History of uterine scar from previous surgery: Secondary | ICD-10-CM

## 2011-11-20 LAB — COMPREHENSIVE METABOLIC PANEL
ALT: 29 U/L (ref 0–35)
AST: 39 U/L — ABNORMAL HIGH (ref 0–37)
Albumin: 2.4 g/dL — ABNORMAL LOW (ref 3.5–5.2)
Alkaline Phosphatase: 83 U/L (ref 39–117)
BUN: 8 mg/dL (ref 6–23)
CO2: 26 mEq/L (ref 19–32)
Calcium: 8.6 mg/dL (ref 8.4–10.5)
Chloride: 105 mEq/L (ref 96–112)
Creatinine, Ser: 0.79 mg/dL (ref 0.50–1.10)
GFR calc Af Amer: >90 mL/min (ref >90)
GFR calc non Af Amer: >90 mL/min (ref >90)
Glucose, Bld: 88 mg/dL (ref 70–99)
Potassium: 4.3 mEq/L (ref 3.5–5.1)
Sodium: 137 mEq/L (ref 135–145)
Total Bilirubin: 0.2 mg/dL — ABNORMAL LOW (ref 0.3–1.2)
Total Protein: 5.6 g/dL — ABNORMAL LOW (ref 6.0–8.3)

## 2011-11-20 LAB — URIC ACID: Uric Acid, Serum: 6 mg/dL (ref 2.4–7.0)

## 2011-11-20 LAB — LACTATE DEHYDROGENASE: LDH: 279 U/L — ABNORMAL HIGH (ref 94–250)

## 2011-11-20 MED ORDER — HYDROCHLOROTHIAZIDE 25 MG PO TABS: 25.0000 mg | ORAL_TABLET | Freq: Every day | ORAL | Status: DC

## 2011-11-20 MED ORDER — NIFEDIPINE ER 60 MG PO TB24: 60.0000 mg | ORAL_TABLET | Freq: Every day | ORAL | Status: DC

## 2011-11-20 MED ORDER — OXYCODONE-ACETAMINOPHEN 5-325 MG PO TABS: 1.0000 | ORAL_TABLET | ORAL | Status: AC | PRN

## 2011-11-20 MED ORDER — NIFEDIPINE ER 60 MG PO TB24
60.0000 mg | ORAL_TABLET | Freq: Every day | ORAL | Status: DC
  Administered 2011-11-20: 60 mg via ORAL
  Filled 2011-11-20 (×2): qty 1

## 2011-11-20 MED ORDER — IBUPROFEN 600 MG PO TABS: 600.0000 mg | ORAL_TABLET | Freq: Four times a day (QID) | ORAL | Status: AC

## 2011-11-20 MED ORDER — FE BISGLYCINATE-FE POLYSAC 40-20 MG PO CAPS: 1.0000 | ORAL_CAPSULE | Freq: Every day | ORAL | Status: AC

## 2011-11-20 NOTE — Progress Notes (Signed)
Patient ID: Chloe Graves, female   DOB: 1991/03/20, 21 y.o.   MRN: [id number] Subjective: Postpartum Day 4: Cesarean Delivery for preeclampsia, preterm at 27wks  Patient reports tolerating PO, + flatus, + BM and no problems voiding.  C/o slight HA mainly on l side, has improved overnight, denies any visual disturbances, no N/V, no RUQ pain no complaints, up ad lib without syncope, has been going to NICU to see infant, stable per pt Pain well controlled with po meds Pumping is going well Mood stable, bonding well Ready to go home Considering IUD   Objective: Vital signs in last 24 hours: Temp:  [98 F (36.7 C)-98.3 F (36.8 C)] 98.2 F (36.8 C) (07/04 0727) Pulse Rate:  [71-105] 83  (07/04 0915) Resp:  [16-20] 20  (07/04 0915) BP: (114-160)/(68-102) 157/100 mmHg (07/04 0915) SpO2:  [99 %-100 %] 100 % (07/04 0915) Weight:  [222 lb 8 oz (100.925 kg)] 222 lb 8 oz (100.925 kg) (07/04 0600)  Filed Vitals:   11/20/11 0724 11/20/11 0727 11/20/11 0800 11/20/11 0915  BP: 151/99  151/100 157/100  Pulse:  78  83  Temp:  98.2 F (36.8 C)    TempSrc:  Oral    Resp:  18  20  Height:      Weight:      SpO2:  99%  100%   Overnight 130-160/86-102  Physical Exam:  General: alert and no distress Breasts: soft, filling Heart: RRR Lungs: CTAB Abdomen: BS x4 Uterine Fundus: firm Incision: healing well, steri strips intact Lochia: appropriate DVT Evaluation: No evidence of DVT seen on physical exam. Negative Homan's sign. No significant calf/ankle edema.  No results found for this basename: HGB:2,HCT:2 in the last 72 hours  Assessment/Plan: Status post Cesarean section. Doing well postoperatively.  Gestational hypertension/superimposed preeclampsia  Will increase procardia to 60mg , if BP improves consider d/c later this afternoon Lactating PIH labs stable, AST was 39 (slight elevation)  D/W Dr Pura Spice 11/20/2011, 10:23 AM

## 2011-11-20 NOTE — Progress Notes (Signed)
Patient ID: Chloe Graves, female   DOB: 02-17-1991, 21 y.o.   MRN: [id number] S: pt has been resting, HA improved, but recently returned, is getting relief w percocet O:  Filed Vitals:   11/20/11 1048 11/20/11 1154 11/20/11 1159 11/20/11 1417  BP: 113/66 107/50  113/67  Pulse:   99 94  Temp:   98.1 F (36.7 C)   TempSrc:   Oral   Resp:    18  Height:      Weight:      SpO2:   100% 99%   A: post-op primary c-section day 4 w gestational hypertension/preeclampsia BP's improved w inc procardia dose  P: D/C home Smart start nurse to check BP tmrw F/u office 1wk for BP check Cont procardia 60mg  XL HCTZ 25mg  x1 week D/W Dr Estanislado Pandy

## 2011-11-20 NOTE — Progress Notes (Signed)
11/20/11 1550 Discharge instructions & prescription for Percocet given. Pt D/C'd home ambulatory w/FOB.

## 2011-11-20 NOTE — Progress Notes (Signed)
UR chart review completed.  

## 2011-11-21 ENCOUNTER — Other Ambulatory Visit: Payer: Self-pay | Admitting: Obstetrics and Gynecology

## 2011-11-21 ENCOUNTER — Inpatient Hospital Stay (HOSPITAL_COMMUNITY)
Admission: AD | Admit: 2011-11-21 | Discharge: 2011-11-21 | Disposition: A | Discharge status: Home / Self Care | Payer: 59 | Source: Ambulatory Visit | Attending: Obstetrics and Gynecology | Admitting: Obstetrics and Gynecology

## 2011-11-21 ENCOUNTER — Telehealth: Payer: Self-pay | Admitting: Obstetrics and Gynecology

## 2011-11-21 DIAGNOSIS — O139 Gestational [pregnancy-induced] hypertension without significant proteinuria, unspecified trimester: Secondary | ICD-10-CM

## 2011-11-21 DIAGNOSIS — O1490 Unspecified pre-eclampsia, unspecified trimester: Secondary | ICD-10-CM | POA: Diagnosis not present

## 2011-11-21 LAB — LACTATE DEHYDROGENASE: LDH: 294 U/L — ABNORMAL HIGH (ref 94–250)

## 2011-11-21 LAB — COMPREHENSIVE METABOLIC PANEL
ALT: 27 U/L (ref 0–35)
AST: 21 U/L (ref 0–37)
Albumin: 2.9 g/dL — ABNORMAL LOW (ref 3.5–5.2)
Alkaline Phosphatase: 97 U/L (ref 39–117)
BUN: 15 mg/dL (ref 6–23)
CO2: 26 mEq/L (ref 19–32)
Calcium: 9.7 mg/dL (ref 8.4–10.5)
Chloride: 103 mEq/L (ref 96–112)
Creat: 0.94 mg/dL (ref 0.50–1.10)
Glucose, Bld: 95 mg/dL (ref 70–99)
Potassium: 3.9 mEq/L (ref 3.5–5.3)
Sodium: 139 mEq/L (ref 135–145)
Total Bilirubin: 0.2 mg/dL — ABNORMAL LOW (ref 0.3–1.2)
Total Protein: 6.6 g/dL (ref 6.0–8.3)

## 2011-11-21 LAB — CBC
HCT: 33.8 % — ABNORMAL LOW (ref 36.0–46.0)
Hemoglobin: 10.9 g/dL — ABNORMAL LOW (ref 12.0–15.0)
MCH: 26.3 pg (ref 26.0–34.0)
MCHC: 32.2 g/dL (ref 30.0–36.0)
MCV: 81.4 fL (ref 78.0–100.0)
Platelets: 281 10*3/uL (ref 150–400)
RBC: 4.15 MIL/uL (ref 3.87–5.11)
RDW: 16.9 % — ABNORMAL HIGH (ref 11.5–15.5)
WBC: 12.1 10*3/uL — ABNORMAL HIGH (ref 4.0–10.5)

## 2011-11-21 LAB — URIC ACID: Uric Acid, Serum: 7 mg/dL (ref 2.4–7.0)

## 2011-11-21 MED ORDER — NIFEDIPINE ER OSMOTIC RELEASE 30 MG PO TB24: 30.0000 mg | ORAL_TABLET | Freq: Every day | ORAL | Status: DC

## 2011-11-21 NOTE — Telephone Encounter (Signed)
Patient presented to 3rd floor to get her BP checked (was not instructed to do that, just went by since she had been on that floor during hospitalization, and had been instructed to get BP checked today). S/P primary LTCS on 11/16/11 for worsening gestational hypertension/pre-eclampsia at 27-28 weeks--received 24 hours of Magnesium Sulfate post-delivery, and was d/c'd home on Procardia 60 mg XL. Infant in NICU, now weighs 1+11, stable. Patient's BP on 3rd floor was 150/100--took Procardia 60 mg XL this am. Denies HA, visual symptoms, epigastric pain. Edema improving.  Dr. Su Hilt was called with BP from 3rd floor. Requested I have outpatient labs drawn on patient tonight and increase her Procardia to 90 mg XL--patient is to take additional 30 mg XL tonight, start 90 mg XL tomorrow, and have BP checked in Triage tomorrow.  If patient able to understand and comply with these instructions, she would not need to be seen as a MAU patient. Patient had already been registered in MAU when I received these instructions--I call Admitting staff to request patient not be brought back into MAU, but this was not communicated to MAU staff.   I reviewed Dr. Les Pou recommendation with the patient, and she felt more comfortable and able to comply with instructions without MAU admission.  Patient was removed from MAU, outpatient labs (CBC, CMP, LDH, Uric acid) were drawn STAT, and Rx for Procardia 30 mg XL was sent electronically to [facility] (patient choice, since her primary pharmacy, [facility], will not be open over the weekend).  Patient understands if labs are abnormal, she will need to return to hospital.  I will follow-up on these labs. She will take 30 mg XL dose tonight, and start a total of 90 mg XL tomorrow (60 mg XL tab + 30 mg XL tab). Needs BP re-check tomorrow via CNM in Triage--will inform Sanda Klein, CNM, of plan.  If CNM can check BP in triage room, patient will not need  to be registered.  Patient is to call [phone] tomorrow early afternoon to arrange for BP check by CNM. PIH precautions reviewed with patient.

## 2011-11-21 NOTE — MAU Note (Signed)
Patient was not to be brought back, since she was an outpatient.

## 2011-11-22 NOTE — Discharge Summary (Signed)
Obstetric Discharge Summary Reason for Admission: 27wks, GHTN/pre-eclampsia, oligohydramnios, IUGR, non-reactive NST Prenatal Procedures: NST, Preeclampsia, ultrasound and BMZ course, antihypertensives Intrapartum Procedures: Magsulfate, LTCS, Spinal,  Postpartum Procedures: mag sulfate, antihypertensives Complications-Operative and Postpartum: severe hypertension/preeclampsia, requiring readadmission to AICU    Hemoglobin  Date Value Range Status  11/21/2011 10.9* 12.0 - 15.0 g/dL Final     HCT  Date Value Range Status  11/21/2011 33.8* 36.0 - 46.0 % Final   Consults: MFM, NICU, SW  Hospital Course:  Hospital Course: Pt was admitted at 27wks w increasing BP, oligohydramnios, IUGR, non-reactive NST.  FHR was reassuring, and pt was initially stable without antihypertensives. PIH labs remained stable. BMZ course was given. MFM was consulted.  However by day 3 of admission, severe hypertension was noted and not able to be controlled w antihypertensives. MagSulfate was initiated and Dr Normand Sloop performed a primary LTCS. Infant remained stable in NICU. Patient olerated the procedure without difficulty, and was stable in AICU w diureses and normal BP's.  On POD 2, pt was transferred out of AICU to [facility].  However the next morning on POD 3, severe hypertension was noted, and she was tx'd back to AICU and again antihypertensives were given.  PIH labs remained normal and otherwise postoperative course WNL. On POD4 procardia XL was increased from 30mg  to 60mg  and BP was then normal. Mom's physical exam was WNL, and she was discharged home in stable condition. Contraception plan was IUD.  She received adequate benefit from po pain medications.  Discharge Diagnoses: GHTN/superimposed preeclampsia, IUGR, s/p LTCS, anemia   Discharge Information: Date: 11/22/2011 Activity: pelvic rest Diet: routine Medications:  Medication List  As of 11/22/2011 11:34 PM   START taking these medications        hydrochlorothiazide 25 MG tablet   Commonly known as: HYDRODIURIL   Take 1 tablet (25 mg total) by mouth daily.      ibuprofen 600 MG tablet   Commonly known as: ADVIL,MOTRIN   Take 1 tablet (600 mg total) by mouth every 6 (six) hours.      iron polysaccharides 40-20 MG capsule   Commonly known as: NIFEREX 60   Take 1 capsule by mouth daily with breakfast.      NIFEdipine 60 MG 24 hr tablet   Commonly known as: PROCARDIA-XL/ADALAT CC   Take 1 tablet (60 mg total) by mouth daily.      oxyCODONE-acetaminophen 5-325 MG per tablet   Commonly known as: PERCOCET   Take 1-2 tablets by mouth every 4 (four) hours as needed (moderate - severe pain).         CONTINUE taking these medications         albuterol (5 MG/ML) 0.5% Nebu   Commonly known as: PROVENTIL, VENTOLIN      CITRANATAL ASSURE PO         STOP taking these medications         ondansetron 8 MG tablet          Where to get your medications    These are the prescriptions that you need to pick up. We sent them to a specific pharmacy, so you will need to go there to get them.   [facility] - [place], [place] - [address]    [address] [place] [address]    Phone: [phone]        hydrochlorothiazide 25 MG tablet   ibuprofen 600 MG tablet   iron polysaccharides  40-20 MG capsule   NIFEdipine 60 MG 24 hr tablet         You may get these medications from any pharmacy.         oxyCODONE-acetaminophen 5-325 MG per tablet           Condition: stable Instructions: refer to practice specific booklet Discharge to: home Follow-up Information    Follow up with [facility]. Call in 1 week. (BP check in 1 weeik)        smart start nurse to check BP on 7-5  Newborn Data: Live born  Information for the patient's newborn:  Syann, Cupples [[id number]]  female ; APGAR , 6 8  ; weight ; 1#6.9oz  Remains in NICU  Joanna Borawski M 11/22/2011, 11:34 PM

## 2011-11-24 ENCOUNTER — Encounter: Payer: 59 | Admitting: Obstetrics and Gynecology

## 2011-11-25 ENCOUNTER — Telehealth: Payer: Self-pay | Admitting: Obstetrics and Gynecology

## 2011-11-26 ENCOUNTER — Encounter: Payer: 59 | Admitting: Obstetrics and Gynecology

## 2011-11-26 ENCOUNTER — Ambulatory Visit (INDEPENDENT_AMBULATORY_CARE_PROVIDER_SITE_OTHER): Payer: 59 | Admitting: Obstetrics and Gynecology

## 2011-11-26 ENCOUNTER — Encounter: Payer: Self-pay | Admitting: Obstetrics and Gynecology

## 2011-11-26 VITALS — BP 116/74 | Ht 62.0 in | Wt 210.0 lb

## 2011-11-26 DIAGNOSIS — IMO0002 Reserved for concepts with insufficient information to code with codable children: Secondary | ICD-10-CM

## 2011-11-26 NOTE — Progress Notes (Signed)
Pt here for pp bp check and incision check no complications at this time with incision cite  Pt denies HA, CP, SOB,ABD PAIN BP 116/74  Ht 5\' 2"  (1.575 m)  Wt 210 lb (95.255 kg)  BMI 38.41 kg/m2 Physical Examination: General appearance - alert, well appearing, and in no distress Mental status - alert, oriented to person, place, and time Chest - clear to auscultation, no wheezes, rales or rhonchi, symmetric air entry Heart - normal rate and regular rhythm Abdomen - soft, nontender, nondistended, no masses or organomegaly Incision CDI Extremities - 2 plus DTR, no clonus B S/p emergency c/s for preeclampsia BP controlled on procardia Infant doing well in NICU RT 4 weeks for pp exam

## 2011-12-03 NOTE — Telephone Encounter (Signed)
Pt delivered

## 2011-12-22 ENCOUNTER — Ambulatory Visit: Payer: 59 | Admitting: Obstetrics and Gynecology

## 2011-12-24 ENCOUNTER — Encounter: Payer: Self-pay | Admitting: Obstetrics and Gynecology

## 2011-12-24 ENCOUNTER — Ambulatory Visit (INDEPENDENT_AMBULATORY_CARE_PROVIDER_SITE_OTHER): Payer: 59 | Admitting: Obstetrics and Gynecology

## 2011-12-24 NOTE — Progress Notes (Signed)
Date of delivery: 11/16/2011 Female Name: Rosalie Doctor Vaginal delivery:no Cesarean section:yes Tubal ligation:no GDM:no Breast Feeding:no Bottle Feeding:yes Post-Partum Blues:no Abnormal pap:no Normal GU function: yes Normal GI function:yes Returning to work:no EPDS:  8

## 2011-12-24 NOTE — Addendum Note (Signed)
Addended by: Janeece Agee on: 12/24/2011 03:51 PM   Modules accepted: Orders

## 2011-12-24 NOTE — Progress Notes (Signed)
S: comfortable, still in NICU now 2#9 oz      Bleeding stopped     Bottle feeding O VSS     abd soft, nontender incision well healed supra pubic     Diastasis recti finger breath     Normal hair distrubition mons pubis,      EGBUS and perineum WNL,  v good vaginal tone cerix LTC, no cervical motion tenderness, uterus firm small     A normal involution     Nonactating    PP hx of cesarean section with severe PIH P f/o up annual and prn    F/o up IUD discussed risks bleeding, infection, perfortation Lavera Guise, CNM

## 2011-12-25 ENCOUNTER — Other Ambulatory Visit: Payer: Self-pay

## 2011-12-25 LAB — GC/CHLAMYDIA PROBE AMP, GENITAL
Chlamydia, DNA Probe: NEGATIVE
GC Probe Amp, Genital: NEGATIVE

## 2011-12-26 ENCOUNTER — Telehealth: Payer: Self-pay | Admitting: Obstetrics and Gynecology

## 2011-12-26 NOTE — Telephone Encounter (Signed)
Spoke with pt rgd msg pt want return to work note pt will call back with fax number

## 2011-12-26 NOTE — Telephone Encounter (Signed)
Spoke with pt rgd letter informed letter sent to job pt voice understanding

## 2012-01-02 ENCOUNTER — Encounter: Payer: Self-pay | Admitting: Obstetrics and Gynecology

## 2012-01-02 ENCOUNTER — Ambulatory Visit (INDEPENDENT_AMBULATORY_CARE_PROVIDER_SITE_OTHER): Payer: 59 | Admitting: Obstetrics and Gynecology

## 2012-01-02 VITALS — BP 122/80 | Resp 16 | Ht 62.0 in | Wt 216.0 lb

## 2012-01-02 DIAGNOSIS — B9689 Other specified bacterial agents as the cause of diseases classified elsewhere: Secondary | ICD-10-CM | POA: Insufficient documentation

## 2012-01-02 DIAGNOSIS — N760 Acute vaginitis: Secondary | ICD-10-CM

## 2012-01-02 DIAGNOSIS — A499 Bacterial infection, unspecified: Secondary | ICD-10-CM

## 2012-01-02 DIAGNOSIS — Z975 Presence of (intrauterine) contraceptive device: Secondary | ICD-10-CM

## 2012-01-02 LAB — POCT WET PREP (WET MOUNT)
Clue Cells Wet Prep Whiff POC: POSITIVE
WBC, Wet Prep HPF POC: NEGATIVE
pH: 5

## 2012-01-02 LAB — POCT URINE PREGNANCY: Preg Test, Ur: NEGATIVE

## 2012-01-02 LAB — POCT OSOM BVBLUE TEST: Bacterial Vaginosis: POSITIVE

## 2012-01-02 MED ORDER — LEVONORGESTREL 20 MCG/24HR IU IUD: INTRAUTERINE_SYSTEM | Freq: Once | INTRAUTERINE | Status: DC

## 2012-01-02 MED ORDER — METRONIDAZOLE 500 MG PO TABS: 500.0000 mg | ORAL_TABLET | Freq: Two times a day (BID) | ORAL | Status: AC

## 2012-01-02 NOTE — Progress Notes (Signed)
  Vaginal Discharge/Discomfort/Itching  Subjective:   Chloe Graves is an 21 y.o. woman who presents c/o vaginal odor and had been scheduled for placement of Mirena IUD today.   Reluctant to place Mirena before r/o BV or other vaginal infection.  Objective: On speculum examination Odor c/w with BV  Wet prep: clue cells OSOM BV: positive OSOM Trichomonas:  negative  Assessment: BV infection c/w with findings.  Plan: Advised that it is necessary to postpone Mirena insertion until after Completion of treatment for BV  andTOC for BV 10 days s/p tx.  Medications: Flagyl 500mg s po BID x 7days Counseling:  Re use of Condom  Barrier Method of contraception while being treated and until TOC. Follow-up: 3 weeks for TOC. Will consider placement of Mirena after TOC is negative.  Ileana Chalupa, CNM. 01/02/2012 4:10 PM

## 2013-11-23 ENCOUNTER — Encounter (HOSPITAL_COMMUNITY): Payer: Self-pay | Admitting: Emergency Medicine

## 2013-11-23 ENCOUNTER — Emergency Department (INDEPENDENT_AMBULATORY_CARE_PROVIDER_SITE_OTHER)
Admission: EM | Admit: 2013-11-23 | Discharge: 2013-11-23 | Disposition: A | Discharge status: Home / Self Care | Payer: Self-pay | Source: Home / Self Care | Attending: Family Medicine | Admitting: Family Medicine

## 2013-11-23 DIAGNOSIS — J069 Acute upper respiratory infection, unspecified: Secondary | ICD-10-CM

## 2013-11-23 LAB — POCT RAPID STREP A: Streptococcus, Group A Screen (Direct): NEGATIVE

## 2013-11-23 NOTE — Discharge Instructions (Signed)
Drink plenty of fluids as discussed, use lozenges and salt water gargle and mucinex or delsym for cough. Return or see your doctor if further problems

## 2013-11-23 NOTE — ED Provider Notes (Signed)
CSN: [id number]     Arrival date & time 11/23/13  1225 History   First MD Initiated Contact with Patient 11/23/13 1405     Chief Complaint  Patient presents with  . Sore Throat   (Consider location/radiation/quality/duration/timing/severity/associated sxs/prior Treatment) Patient is a 23 y.o. female presenting with pharyngitis. The history is provided by the patient.  Sore Throat This is a new problem. The current episode started more than 2 days ago (sore throat x 4 d, son with recent uri after going to zoo.). The problem has not changed since onset.Pertinent negatives include no chest pain, no abdominal pain and no headaches. The symptoms are aggravated by swallowing.    Past Medical History  Diagnosis Date  . H/O candidiasis   . H/O varicella   . H/O rubella   . H/O cystitis   . Low iron   . Bacterial infection   . Yeast infection   . Anemia   . Normal pregnancy, incidental 09/22/2011   Past Surgical History  Procedure Laterality Date  . Wisdom tooth extraction    . Cesarean section  11/16/2011    Procedure: CESAREAN SECTION;  Surgeon: Michael LitterNaima A Dillard, MD;  Location: [facility];  Service: Gynecology;  Laterality: N/A;  Primary cesarean section with delivery of baby boy at [id number].  Apgars 6/8.   Family History  Problem Relation Age of Onset  . Cancer Maternal Aunt   . Cancer Maternal Grandmother    History  Substance Use Topics  . Smoking status: Former Smoker    Quit date: 05/20/2011  . Smokeless tobacco: Never Used  . Alcohol Use: No   OB History   Grav Para Term Preterm Abortions TAB SAB Ect Mult Living   2 1  1 1     1      Review of Systems  Constitutional: Negative.   HENT: Positive for congestion, postnasal drip and sore throat.   Respiratory: Negative.   Cardiovascular: Negative for chest pain.  Gastrointestinal: Negative.  Negative for abdominal pain.  Neurological: Negative for headaches.    Allergies  Latex  Home Medications   Prior to Admission  medications   Medication Sig Start Date End Date Taking? Authorizing Provider  albuterol (PROVENTIL, VENTOLIN) (5 MG/ML) 0.5% NEBU Take by nebulization continuous. Short of breath    Historical Provider, MD  hydrochlorothiazide (HYDRODIURIL) 25 MG tablet Take 1 tablet (25 mg total) by mouth daily. 11/20/11 11/19/12  Malissa HippoShelley M Lillard, CNM  ibuprofen (ADVIL,MOTRIN) 200 MG tablet Take 200 mg by mouth every 6 (six) hours as needed. pain    Historical Provider, MD  NIFEdipine (PROCARDIA XL) 30 MG 24 hr tablet Take 1 tablet (30 mg total) by mouth daily. 11/21/11 11/20/12  Nigel BridgemanVicki Latham, CNM  NIFEdipine (PROCARDIA-XL/ADALAT CC) 60 MG 24 hr tablet Take 1 tablet (60 mg total) by mouth daily. 11/20/11 11/19/12  Malissa HippoShelley M Lillard, CNM  Prenat w/o [id number] (CITRANATAL ASSURE PO) Take by mouth.    Historical Provider, MD   BP 129/86  Pulse 70  Temp(Src) 98.5 F (36.9 C) (Oral)  Resp 18  SpO2 99%  LMP 11/09/2013 Physical Exam  Nursing note and vitals reviewed. Constitutional: She is oriented to person, place, and time. She appears well-developed and well-nourished.  HENT:  Head: Normocephalic.  Right Ear: External ear normal.  Left Ear: External ear normal.  Nose: Nose normal.  Mouth/Throat: Oropharynx is clear and moist.  Eyes: Conjunctivae are normal. Pupils are equal, round, and reactive to light.  Neck: Normal range  of motion. Neck supple.  Pulmonary/Chest: Effort normal and breath sounds normal.  Lymphadenopathy:    She has cervical adenopathy.  Neurological: She is alert and oriented to person, place, and time.  Skin: Skin is warm and dry.    ED Course  Procedures (including critical care time) Labs Review Labs Reviewed  POCT RAPID STREP A ([facility] CARE ONLY)    Imaging Review No results found.   MDM   1. URI (upper respiratory infection)        Linna HoffJames D Kindl, MD 11/23/13 1455

## 2013-11-23 NOTE — ED Notes (Signed)
C/o sore throat States she does have a cough and nasal drainage otc meds used as tx

## 2013-11-25 LAB — CULTURE, GROUP A STREP

## 2014-01-12 ENCOUNTER — Other Ambulatory Visit: Payer: Self-pay | Admitting: Obstetrics & Gynecology

## 2014-01-12 ENCOUNTER — Other Ambulatory Visit (HOSPITAL_COMMUNITY)
Admission: RE | Admit: 2014-01-12 | Discharge: 2014-01-12 | Disposition: A | Discharge status: Home / Self Care | Payer: PRIVATE HEALTH INSURANCE | Source: Ambulatory Visit | Attending: Obstetrics & Gynecology | Admitting: Obstetrics & Gynecology

## 2014-01-12 DIAGNOSIS — Z01419 Encounter for gynecological examination (general) (routine) without abnormal findings: Secondary | ICD-10-CM | POA: Insufficient documentation

## 2014-01-13 LAB — CYTOLOGY - PAP

## 2014-03-20 ENCOUNTER — Encounter (HOSPITAL_COMMUNITY): Payer: Self-pay | Admitting: Emergency Medicine

## 2015-02-05 ENCOUNTER — Ambulatory Visit (INDEPENDENT_AMBULATORY_CARE_PROVIDER_SITE_OTHER): Payer: 59 | Admitting: Family Medicine

## 2015-02-05 VITALS — BP 112/72 | HR 63 | Temp 99.0°F | Resp 18 | Ht 63.0 in | Wt 242.0 lb

## 2015-02-05 DIAGNOSIS — H6501 Acute serous otitis media, right ear: Secondary | ICD-10-CM | POA: Diagnosis not present

## 2015-02-05 DIAGNOSIS — H109 Unspecified conjunctivitis: Secondary | ICD-10-CM | POA: Diagnosis not present

## 2015-02-05 DIAGNOSIS — J040 Acute laryngitis: Secondary | ICD-10-CM

## 2015-02-05 MED ORDER — POLYMYXIN B-TRIMETHOPRIM 10000-0.1 UNIT/ML-% OP SOLN: 1.0000 [drp] | OPHTHALMIC | Status: DC

## 2015-02-05 MED ORDER — CEFDINIR 300 MG PO CAPS: 300.0000 mg | ORAL_CAPSULE | Freq: Two times a day (BID) | ORAL | Status: DC

## 2015-02-05 NOTE — Patient Instructions (Signed)
Use the omnicef twice a day for 10 days- this is for your chest and also your ear Use the eye drops as directed- if your left eye starts to seem infected you can treat it as well Rest- especially try to rest your voice!  Let us know if you are not feeling better in the next few days- Sooner if worse.

## 2015-02-05 NOTE — Progress Notes (Signed)
[facility] [address], [place] [address] [phone]  Date:  02/05/2015   Name:  Teighlor Korson   DOB:  [birth year]   MRN:  [id number]  PCP:  No primary care provider on file.    Chief Complaint: Laryngitis; Conjunctivitis; Ear Pain; and Cough   History of Present Illness:  Chloe Graves is a 24 y.o. very pleasant female patient who presents with the following:  Generally healthy young lady here today with illness- she has noted sx for about one week.  She thought she had a viral URI-  however nasal sx have turned into cough, headaches,her  right ear hurts, and her right eye was swollen and had discharge this am She does not wear glasses or contacts- her vision seems normal to her.    She has also lost her voice She has felt hot and cold.  Has not measured a fever She has noted a ST as well  She has a mirena IUD Here today with her husband.  They have a 51 year old child   There are no active problems to display for this patient.   History reviewed. No pertinent past medical history.  Past Surgical History  Procedure Laterality Date  . Cesarean section      Social History  Substance Use Topics  . Smoking status: Current Every Day Smoker  . Smokeless tobacco: None     Comment: 1 black n mid daily   . Alcohol Use: 0.6 oz/week    1 Standard drinks or equivalent per week    History reviewed. No pertinent family history.  No Known Allergies  Medication list has been reviewed and updated.  No current outpatient prescriptions on file prior to visit.   No current facility-administered medications on file prior to visit.    Review of Systems:  As per HPI- otherwise negative.   Physical Examination: Filed Vitals:   02/05/15 1425  BP: 112/72  Pulse: 63  Temp: 99 F (37.2 C)  Resp: 18   Filed Vitals:   02/05/15 1425  Height:  (1.6 m)  Weight: 242 lb (109.77 kg)   Body mass index is 42.88 kg/(m^2). Ideal Body Weight: Weight  in (lb) to have BMI = 25: 140.8  GEN: WDWN, NAD, Non-toxic, A & O x 3, obese, looks well HEENT: Atraumatic, Normocephalic. Neck supple. No masses, No LAD. leftTM wnl, right TIM is bulging and red with a clear effusion behind, oropharynx normal.  PEERL,EOMI.   Mild injection of the right eye Limited fundoscopic exam wnl Ears and Nose: No external deformity. CV: RRR, No M/G/R. No JVD. No thrill. No extra heart sounds. PULM: CTA B, no wheezes, crackles, rhonchi. No retractions. No resp. distress. No accessory muscle use. EXTR: No c/c/e NEURO Normal gait.  PSYCH: Normally interactive. Conversant. Not depressed or anxious appearing.  Calm demeanor.   Assessment and Plan: Right acute serous otitis media, recurrence not specified - Plan: cefdinir (OMNICEF) 300 MG capsule  Conjunctivitis of right eye - Plan: trimethoprim-polymyxin b (POLYTRIM) ophthalmic solution  Laryngitis  Treat today with omnicef for OM with cough polytrim eye drops Voice rest Follow-up if not better soon  Signed Abbe Amsterdam, MD

## 2015-02-12 ENCOUNTER — Telehealth: Payer: Self-pay

## 2015-02-12 NOTE — Telephone Encounter (Signed)
Patient is returning a missed phone call for Chloe Graves

## 2015-02-12 NOTE — Telephone Encounter (Signed)
Pt is needing to get a stronger medication [phone]

## 2015-02-12 NOTE — Telephone Encounter (Signed)
Left message for pt to call back  °

## 2015-02-13 NOTE — Telephone Encounter (Signed)
Left message for pt to call back. Advised to call back with details.

## 2015-03-12 ENCOUNTER — Encounter: Payer: Self-pay | Admitting: Dietician

## 2015-03-12 ENCOUNTER — Encounter: Payer: 59 | Attending: Family Medicine | Admitting: Dietician

## 2015-03-12 DIAGNOSIS — Z6841 Body Mass Index (BMI) 40.0 and over, adult: Secondary | ICD-10-CM | POA: Diagnosis not present

## 2015-03-12 DIAGNOSIS — Z713 Dietary counseling and surveillance: Secondary | ICD-10-CM | POA: Insufficient documentation

## 2015-03-12 NOTE — Progress Notes (Signed)
Medical Nutrition Therapy:  Appt start time: 1615 end time:  1715.   Assessment:  Primary concerns today: Chloe Graves is here today since she keeps gaining weight even though she is trying not to. Pre-pregnancy weight was 205 lbs about 3 years ago. Has tried exercising, cutting back on eating, drinking water, and having more meals instead of snacks. Has been able to 10 lbs at a time and then weight loss stops. In the process of quitting smoking. 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 Lives with her husband and son. Husband works 2 jobs. Her and her husband do the food shopping and meal preparation. Skips a lot of breakfast meals and sometimes might just have chips for lunch. Eats a dinner meal around 8 PM. Cut out eating out for the most part.  Doesn't eat very much if she cooks.    Having trouble fitting in exercise in her schedule. Goes to school online. Sleeping about 5 hours each night. Feels hungry throughout the day but tends to ignore it if she is busy.   Would like to lose at least 60 lbs.   Preferred Learning Style:   No preference indicated   Learning Readiness:   Ready  MEDICATIONS: see list   DIETARY INTAKE:  Usual eating pattern includes 1-3 meals and 0-3 snacks per day.  Avoided foods include pasta    24-hr recall:  B ( AM): none or 1/2 [employer] breakfast sandwich with coffee with pumpkin spice creamer Snk ( AM): none or plums/strawberries  L ( PM): none or chips or subway 6 inch tuna with veggies or 4 piece kids meal from [employer] Snk ( PM): vienna wienies sometimes D ( PM): baked chicken with mashed potatoes and green (kale)  Snk ( PM): strawberries and grapes Beverages: 4-5 cups of coffee with flavored creamer (about 8 creamers)  water, [employer]he[employer]ch[employer]ountain Dew sometimes watered down, 3 beers per week   Usual physical activity: none  Estimated energy needs: 2000  calories 225 g carbohydrates 150 g protein 56 g fat  Progress Towards Goal(s):  In progress.   Nutritional Diagnosis:  Friendship-3.3 Overweight/obesity As related to hx of meal skipping and excess carbohydrate consumption.  As evidenced by BMI of 42.7.    Intervention:  Nutrition counseling provided. Plan: Aim to eat 3 meals per day. Pay attention to hunger/fullness feelings. Eat a meal or snack if you are hungry. Try to plan meals on weekends. Try to eat meals without distractions when possible (to make sure you are eating when you are hungry). Aim to fill half of your plate with vegetables at lunch and dinner (salad greens, frozen/microwaveable). Consider getting frozen lunches (lean cuisine, smart ones, or healthy choices). Can add vegetables or fruit on the size.  Limit starches to a quarter of your plate (choose one starch per meal).  Have protein with carbs at snacks (yogurt, fruit with nuts or cheese, etc). Cut coffee creamer in half.  When possible, work on getting around 7-8 hours of sleep. Try to exercise at the gym for around 15 minutes 3 x week.   Teaching Method Utilized:  Visual Auditory Hands on  Handouts given during visit include:  MyPlate Handout  15 g Snacks  Meal card  Barriers to learning/adherence to lifestyle change: none  Demonstrated degree of understanding via:  Teach Back   Monitoring/Evaluation:  Dietary intake, exercise,  and body weight in 1 month(s).

## 2015-03-12 NOTE — Patient Instructions (Signed)
Aim to eat 3 meals per day. Pay attention to hunger/fullness feelings. Eat a meal or snack if you are hungry. Try to plan meals on weekends. Try to eat meals without distractions when possible (to make sure you are eating when you are hungry). Aim to fill half of your plate with vegetables at lunch and dinner (salad greens, frozen/microwaveable). Consider getting frozen lunches (lean cuisine, smart ones, or healthy choices). Can add vegetables or fruit on the size.  Limit starches to a quarter of your plate (choose one starch per meal).  Have protein with carbs at snacks (yogurt, fruit with nuts or cheese, etc). Cut coffee creamer in half.  When possible, work on getting around 7-8 hours of sleep. Try to exercise at the gym for around 15 minutes 3 x week.

## 2015-04-23 ENCOUNTER — Ambulatory Visit: Payer: 59 | Admitting: Dietician

## 2016-08-22 ENCOUNTER — Other Ambulatory Visit (HOSPITAL_COMMUNITY)
Admission: RE | Admit: 2016-08-22 | Discharge: 2016-08-22 | Disposition: A | Discharge status: Home / Self Care | Payer: BLUE CROSS/BLUE SHIELD | Source: Ambulatory Visit | Attending: Obstetrics and Gynecology | Admitting: Obstetrics and Gynecology

## 2016-08-22 ENCOUNTER — Other Ambulatory Visit: Payer: Self-pay | Admitting: Obstetrics & Gynecology

## 2016-08-22 DIAGNOSIS — N760 Acute vaginitis: Secondary | ICD-10-CM | POA: Diagnosis not present

## 2016-08-22 DIAGNOSIS — Z01419 Encounter for gynecological examination (general) (routine) without abnormal findings: Secondary | ICD-10-CM | POA: Diagnosis not present

## 2016-08-22 DIAGNOSIS — N939 Abnormal uterine and vaginal bleeding, unspecified: Secondary | ICD-10-CM | POA: Diagnosis not present

## 2016-08-22 DIAGNOSIS — Z30011 Encounter for initial prescription of contraceptive pills: Secondary | ICD-10-CM | POA: Diagnosis not present

## 2016-08-22 DIAGNOSIS — Z01411 Encounter for gynecological examination (general) (routine) with abnormal findings: Secondary | ICD-10-CM | POA: Diagnosis not present

## 2016-08-26 LAB — CYTOLOGY - PAP: Diagnosis: NEGATIVE

## 2016-08-29 DIAGNOSIS — R112 Nausea with vomiting, unspecified: Secondary | ICD-10-CM | POA: Diagnosis not present

## 2016-08-29 DIAGNOSIS — R109 Unspecified abdominal pain: Secondary | ICD-10-CM | POA: Diagnosis not present

## 2016-08-29 DIAGNOSIS — R5383 Other fatigue: Secondary | ICD-10-CM | POA: Diagnosis not present

## 2016-11-25 DIAGNOSIS — N3001 Acute cystitis with hematuria: Secondary | ICD-10-CM | POA: Diagnosis not present

## 2016-11-25 DIAGNOSIS — R300 Dysuria: Secondary | ICD-10-CM | POA: Diagnosis not present

## 2016-11-25 DIAGNOSIS — N390 Urinary tract infection, site not specified: Secondary | ICD-10-CM | POA: Diagnosis not present

## 2016-11-25 DIAGNOSIS — R319 Hematuria, unspecified: Secondary | ICD-10-CM | POA: Diagnosis not present

## 2016-12-26 DIAGNOSIS — R300 Dysuria: Secondary | ICD-10-CM | POA: Diagnosis not present

## 2016-12-26 DIAGNOSIS — E669 Obesity, unspecified: Secondary | ICD-10-CM | POA: Diagnosis not present

## 2017-03-13 DIAGNOSIS — H6690 Otitis media, unspecified, unspecified ear: Secondary | ICD-10-CM | POA: Diagnosis not present

## 2017-03-17 DIAGNOSIS — R42 Dizziness and giddiness: Secondary | ICD-10-CM | POA: Diagnosis not present

## 2017-03-17 DIAGNOSIS — H60501 Unspecified acute noninfective otitis externa, right ear: Secondary | ICD-10-CM | POA: Diagnosis not present

## 2017-08-25 DIAGNOSIS — Z01411 Encounter for gynecological examination (general) (routine) with abnormal findings: Secondary | ICD-10-CM | POA: Diagnosis not present

## 2017-08-25 DIAGNOSIS — F419 Anxiety disorder, unspecified: Secondary | ICD-10-CM | POA: Diagnosis not present

## 2017-08-25 DIAGNOSIS — N898 Other specified noninflammatory disorders of vagina: Secondary | ICD-10-CM | POA: Diagnosis not present

## 2018-02-03 DIAGNOSIS — Z3480 Encounter for supervision of other normal pregnancy, unspecified trimester: Secondary | ICD-10-CM | POA: Diagnosis not present

## 2018-02-03 DIAGNOSIS — Z3481 Encounter for supervision of other normal pregnancy, first trimester: Secondary | ICD-10-CM | POA: Diagnosis not present

## 2018-02-03 DIAGNOSIS — Z3201 Encounter for pregnancy test, result positive: Secondary | ICD-10-CM | POA: Diagnosis not present

## 2018-03-22 DIAGNOSIS — Z3481 Encounter for supervision of other normal pregnancy, first trimester: Secondary | ICD-10-CM | POA: Diagnosis not present

## 2018-03-23 DIAGNOSIS — O09299 Supervision of pregnancy with other poor reproductive or obstetric history, unspecified trimester: Secondary | ICD-10-CM | POA: Diagnosis not present

## 2018-03-23 DIAGNOSIS — Z3481 Encounter for supervision of other normal pregnancy, first trimester: Secondary | ICD-10-CM | POA: Diagnosis not present

## 2018-03-23 DIAGNOSIS — Z124 Encounter for screening for malignant neoplasm of cervix: Secondary | ICD-10-CM | POA: Diagnosis not present

## 2018-03-23 DIAGNOSIS — Z118 Encounter for screening for other infectious and parasitic diseases: Secondary | ICD-10-CM | POA: Diagnosis not present

## 2018-03-23 DIAGNOSIS — Z3A12 12 weeks gestation of pregnancy: Secondary | ICD-10-CM | POA: Diagnosis not present

## 2018-03-23 DIAGNOSIS — Z3689 Encounter for other specified antenatal screening: Secondary | ICD-10-CM | POA: Diagnosis not present

## 2018-03-23 DIAGNOSIS — O09291 Supervision of pregnancy with other poor reproductive or obstetric history, first trimester: Secondary | ICD-10-CM | POA: Diagnosis not present

## 2018-03-29 DIAGNOSIS — Z79899 Other long term (current) drug therapy: Secondary | ICD-10-CM | POA: Diagnosis not present

## 2018-03-29 DIAGNOSIS — O9A211 Injury, poisoning and certain other consequences of external causes complicating pregnancy, first trimester: Secondary | ICD-10-CM | POA: Diagnosis not present

## 2018-03-29 DIAGNOSIS — Z3A11 11 weeks gestation of pregnancy: Secondary | ICD-10-CM | POA: Diagnosis not present

## 2018-04-21 DIAGNOSIS — O09292 Supervision of pregnancy with other poor reproductive or obstetric history, second trimester: Secondary | ICD-10-CM | POA: Diagnosis not present

## 2018-04-21 DIAGNOSIS — Z3A16 16 weeks gestation of pregnancy: Secondary | ICD-10-CM | POA: Diagnosis not present

## 2018-04-29 ENCOUNTER — Encounter (HOSPITAL_COMMUNITY): Payer: Self-pay | Admitting: Emergency Medicine

## 2018-05-10 DIAGNOSIS — O09292 Supervision of pregnancy with other poor reproductive or obstetric history, second trimester: Secondary | ICD-10-CM | POA: Diagnosis not present

## 2018-05-10 DIAGNOSIS — Z363 Encounter for antenatal screening for malformations: Secondary | ICD-10-CM | POA: Diagnosis not present

## 2018-05-10 DIAGNOSIS — Z3A19 19 weeks gestation of pregnancy: Secondary | ICD-10-CM | POA: Diagnosis not present

## 2018-06-09 DIAGNOSIS — Z3A23 23 weeks gestation of pregnancy: Secondary | ICD-10-CM | POA: Diagnosis not present

## 2018-06-09 DIAGNOSIS — O09292 Supervision of pregnancy with other poor reproductive or obstetric history, second trimester: Secondary | ICD-10-CM | POA: Diagnosis not present

## 2018-06-09 DIAGNOSIS — Z362 Encounter for other antenatal screening follow-up: Secondary | ICD-10-CM | POA: Diagnosis not present

## 2018-07-08 ENCOUNTER — Encounter (HOSPITAL_COMMUNITY): Payer: Self-pay

## 2018-07-08 ENCOUNTER — Observation Stay (HOSPITAL_COMMUNITY): Payer: BLUE CROSS/BLUE SHIELD

## 2018-07-08 ENCOUNTER — Other Ambulatory Visit: Payer: Self-pay

## 2018-07-08 ENCOUNTER — Inpatient Hospital Stay (HOSPITAL_COMMUNITY)
Admission: AD | Admit: 2018-07-08 | Discharge: 2018-07-17 | DRG: 787 | Disposition: A | Discharge status: Home / Self Care | Payer: BLUE CROSS/BLUE SHIELD | Attending: Obstetrics and Gynecology | Admitting: Obstetrics and Gynecology

## 2018-07-08 DIAGNOSIS — O34211 Maternal care for low transverse scar from previous cesarean delivery: Secondary | ICD-10-CM | POA: Diagnosis present

## 2018-07-08 DIAGNOSIS — O1413 Severe pre-eclampsia, third trimester: Secondary | ICD-10-CM | POA: Diagnosis not present

## 2018-07-08 DIAGNOSIS — O4103X0 Oligohydramnios, third trimester, not applicable or unspecified: Secondary | ICD-10-CM | POA: Diagnosis present

## 2018-07-08 DIAGNOSIS — O1403 Mild to moderate pre-eclampsia, third trimester: Secondary | ICD-10-CM | POA: Diagnosis not present

## 2018-07-08 DIAGNOSIS — Z3A28 28 weeks gestation of pregnancy: Secondary | ICD-10-CM | POA: Diagnosis not present

## 2018-07-08 DIAGNOSIS — Z91040 Latex allergy status: Secondary | ICD-10-CM | POA: Diagnosis present

## 2018-07-08 DIAGNOSIS — Z452 Encounter for adjustment and management of vascular access device: Secondary | ICD-10-CM | POA: Diagnosis not present

## 2018-07-08 DIAGNOSIS — O99334 Smoking (tobacco) complicating childbirth: Secondary | ICD-10-CM | POA: Diagnosis present

## 2018-07-08 DIAGNOSIS — Z363 Encounter for antenatal screening for malformations: Secondary | ICD-10-CM | POA: Diagnosis not present

## 2018-07-08 DIAGNOSIS — O9081 Anemia of the puerperium: Secondary | ICD-10-CM | POA: Diagnosis not present

## 2018-07-08 DIAGNOSIS — O4593 Premature separation of placenta, unspecified, third trimester: Secondary | ICD-10-CM | POA: Diagnosis present

## 2018-07-08 DIAGNOSIS — O99214 Obesity complicating childbirth: Secondary | ICD-10-CM | POA: Diagnosis not present

## 2018-07-08 DIAGNOSIS — O1493 Unspecified pre-eclampsia, third trimester: Secondary | ICD-10-CM | POA: Diagnosis not present

## 2018-07-08 DIAGNOSIS — R1011 Right upper quadrant pain: Secondary | ICD-10-CM | POA: Diagnosis not present

## 2018-07-08 DIAGNOSIS — Z3689 Encounter for other specified antenatal screening: Secondary | ICD-10-CM | POA: Diagnosis not present

## 2018-07-08 DIAGNOSIS — J45909 Unspecified asthma, uncomplicated: Secondary | ICD-10-CM | POA: Diagnosis present

## 2018-07-08 DIAGNOSIS — O9952 Diseases of the respiratory system complicating childbirth: Secondary | ICD-10-CM | POA: Diagnosis present

## 2018-07-08 DIAGNOSIS — O1414 Severe pre-eclampsia complicating childbirth: Secondary | ICD-10-CM | POA: Diagnosis not present

## 2018-07-08 DIAGNOSIS — Z3A00 Weeks of gestation of pregnancy not specified: Secondary | ICD-10-CM | POA: Diagnosis not present

## 2018-07-08 DIAGNOSIS — O139 Gestational [pregnancy-induced] hypertension without significant proteinuria, unspecified trimester: Secondary | ICD-10-CM | POA: Diagnosis present

## 2018-07-08 DIAGNOSIS — O09293 Supervision of pregnancy with other poor reproductive or obstetric history, third trimester: Secondary | ICD-10-CM | POA: Diagnosis not present

## 2018-07-08 DIAGNOSIS — O26893 Other specified pregnancy related conditions, third trimester: Secondary | ICD-10-CM | POA: Diagnosis not present

## 2018-07-08 DIAGNOSIS — D62 Acute posthemorrhagic anemia: Secondary | ICD-10-CM | POA: Diagnosis not present

## 2018-07-08 DIAGNOSIS — O99892 Other specified diseases and conditions complicating childbirth: Secondary | ICD-10-CM | POA: Diagnosis not present

## 2018-07-08 DIAGNOSIS — O34219 Maternal care for unspecified type scar from previous cesarean delivery: Secondary | ICD-10-CM

## 2018-07-08 DIAGNOSIS — O09213 Supervision of pregnancy with history of pre-term labor, third trimester: Secondary | ICD-10-CM

## 2018-07-08 DIAGNOSIS — F17200 Nicotine dependence, unspecified, uncomplicated: Secondary | ICD-10-CM | POA: Diagnosis present

## 2018-07-08 DIAGNOSIS — O1490 Unspecified pre-eclampsia, unspecified trimester: Secondary | ICD-10-CM

## 2018-07-08 DIAGNOSIS — O133 Gestational [pregnancy-induced] hypertension without significant proteinuria, third trimester: Secondary | ICD-10-CM | POA: Diagnosis not present

## 2018-07-08 DIAGNOSIS — O1494 Unspecified pre-eclampsia, complicating childbirth: Secondary | ICD-10-CM | POA: Diagnosis not present

## 2018-07-08 DIAGNOSIS — O1400 Mild to moderate pre-eclampsia, unspecified trimester: Secondary | ICD-10-CM | POA: Diagnosis not present

## 2018-07-08 DIAGNOSIS — R52 Pain, unspecified: Secondary | ICD-10-CM

## 2018-07-08 LAB — URIC ACID: Uric Acid, Serum: 5.4 mg/dL (ref 2.5–7.1)

## 2018-07-08 LAB — MAGNESIUM: Magnesium: 4 mg/dL — ABNORMAL HIGH (ref 1.7–2.4)

## 2018-07-08 LAB — COMPREHENSIVE METABOLIC PANEL
ALT: 17 U/L (ref 0–44)
AST: 22 U/L (ref 15–41)
Albumin: 2.8 g/dL — ABNORMAL LOW (ref 3.5–5.0)
Alkaline Phosphatase: 94 U/L (ref 38–126)
Anion gap: 6 (ref 5–15)
BUN: 8 mg/dL (ref 6–20)
CO2: 22 mmol/L (ref 22–32)
Calcium: 8.5 mg/dL — ABNORMAL LOW (ref 8.9–10.3)
Chloride: 107 mmol/L (ref 98–111)
Creatinine, Ser: 0.75 mg/dL (ref 0.44–1.00)
GFR calc Af Amer: >60 mL/min (ref >60)
GFR calc non Af Amer: >60 mL/min (ref >60)
Glucose, Bld: 100 mg/dL — ABNORMAL HIGH (ref 70–99)
Potassium: 3.5 mmol/L (ref 3.5–5.1)
Sodium: 135 mmol/L (ref 135–145)
Total Bilirubin: 0.2 mg/dL — ABNORMAL LOW (ref 0.3–1.2)
Total Protein: 6.7 g/dL (ref 6.5–8.1)

## 2018-07-08 LAB — CBC
HCT: 35.8 % — ABNORMAL LOW (ref 36.0–46.0)
Hemoglobin: 12 g/dL (ref 12.0–15.0)
MCH: 27.5 pg (ref 26.0–34.0)
MCHC: 33.5 g/dL (ref 30.0–36.0)
MCV: 82.1 fL (ref 80.0–100.0)
Platelets: 261 10*3/uL (ref 150–400)
RBC: 4.36 MIL/uL (ref 3.87–5.11)
RDW: 14.4 % (ref 11.5–15.5)
WBC: 8 10*3/uL (ref 4.0–10.5)

## 2018-07-08 MED ORDER — LABETALOL HCL 5 MG/ML IV SOLN
20.0000 mg | INTRAVENOUS | Status: DC | PRN
  Administered 2018-07-08 – 2018-07-14 (×3): 20 mg via INTRAVENOUS
  Filled 2018-07-08 (×3): qty 4

## 2018-07-08 MED ORDER — MAGNESIUM SULFATE 40 G IN LACTATED RINGERS - SIMPLE
2.0000 g/h | INTRAVENOUS | Status: DC
  Administered 2018-07-09: 2 g/h via INTRAVENOUS
  Filled 2018-07-08 (×3): qty 500

## 2018-07-08 MED ORDER — TETANUS-DIPHTH-ACELL PERTUSSIS 5-2.5-18.5 LF-MCG/0.5 IM SUSP
0.5000 mL | Freq: Once | INTRAMUSCULAR | Status: AC
  Administered 2018-07-08: 0.5 mL via INTRAMUSCULAR
  Filled 2018-07-08: qty 0.5

## 2018-07-08 MED ORDER — LACTATED RINGERS IV SOLN
INTRAVENOUS | Status: DC
  Administered 2018-07-08 – 2018-07-09 (×3): via INTRAVENOUS

## 2018-07-08 MED ORDER — LABETALOL HCL 100 MG PO TABS
100.0000 mg | ORAL_TABLET | Freq: Two times a day (BID) | ORAL | Status: DC
  Administered 2018-07-08 (×2): 100 mg via ORAL
  Filled 2018-07-08 (×2): qty 1

## 2018-07-08 MED ORDER — LABETALOL HCL 5 MG/ML IV SOLN
80.0000 mg | INTRAVENOUS | Status: DC | PRN
  Administered 2018-07-12: 80 mg via INTRAVENOUS
  Filled 2018-07-08: qty 16

## 2018-07-08 MED ORDER — LABETALOL HCL 5 MG/ML IV SOLN
40.0000 mg | INTRAVENOUS | Status: DC | PRN
  Administered 2018-07-12: 40 mg via INTRAVENOUS
  Filled 2018-07-08: qty 8

## 2018-07-08 MED ORDER — BETAMETHASONE SOD PHOS & ACET 6 (3-3) MG/ML IJ SUSP
12.0000 mg | INTRAMUSCULAR | Status: AC
  Administered 2018-07-08 – 2018-07-09 (×2): 12 mg via INTRAMUSCULAR
  Filled 2018-07-08 (×2): qty 2

## 2018-07-08 MED ORDER — MAGNESIUM SULFATE BOLUS VIA INFUSION
4.0000 g | Freq: Once | INTRAVENOUS | Status: AC
  Administered 2018-07-08: 4 g via INTRAVENOUS
  Filled 2018-07-08: qty 500

## 2018-07-08 MED ORDER — HYDRALAZINE HCL 20 MG/ML IJ SOLN
10.0000 mg | INTRAMUSCULAR | Status: DC | PRN
  Administered 2018-07-12: 10 mg via INTRAVENOUS
  Filled 2018-07-08: qty 1

## 2018-07-08 NOTE — Consult Note (Signed)
Neonatology Consult to Antenatal Patient: 07/08/2018 5:25 PM    I was requested by Dr Cherly Hensen to see this patient in order to provide antenatal counseling due to suspected pre-eclampsia at [redacted] weeks gestation.    Ms.Mcgillis is a  28 y/o G2P1 who was admitted today and is now 73 0/[redacted] weeks GA.  OB history significant for prior delivery of 27 week infant last 2013 for worsening preeclampsia.  She presented in clinic for routine care today with BP 160/100, proteinuria, headache and mild leg swelling.    She is currently  "not" having active labor.  She is getting BMZ, Magnesium Sulfate and Labetalol BID.   I spoke with Ms.Boreman in Room 303.  We discussed in detail what to expect in case of possible delivery of the infant in the next few days including morbidity and mortality at this gestational age, usual delivery room resuscitation including intubation and surfactant administration in the DR.  Discussed possible respiratory complications and need for support including mechanical ventilation, IV access, sepsis work-up, NG/OG feedings as well as availability of DBM ( she did not have any breast milk during her last pregnancy), risk for IVH with the potential for motor/cognitive deficits, length of stay and long-term outcome.  Ms Vanderhyde said she understands and has no questions since she is aware of what to expect based on her experience with her son last 2013.  Thank you for asking [place] to see this patient and allowing [place] to participate in her care.  Please call if there are any further questions.   Overton Mam, MD (Attending Neonatologist)  Total length of face-to-face or floor/unit time for this encounter was 30 minutes. Counseling and/or coordination of care was greater than fifty percent of the time.

## 2018-07-08 NOTE — Consult Note (Signed)
Maternal-Fetal Medicine Name: Chloe Graves MRN: [id number] Requesting Provider: Maxie Better, MD  Chloe Graves, who is admitted with severe range blood pressures. Patient was seen at your office today and her systolic blood pressures were above 160 mm Hg. She does not have severe headache or visual disturbances or right upper quadrant pain or vaginal bleeding. She reports good fetal movements.  She also had swelling of feet and hands. She had received the first dose of betamethasone and is receiving IV magnesium sulfate.  Her prenatal course has been uneventful so far and her blood pressures have been normal. She does not have gestational diabetes.  Obstetric history significant for a preterm cesarean delivery. Her pregnancy was complicated by gestational hypertension and fetal growth restriction with oligohydramnios. Following nonreassuring heart trace, a low-transverse cesarean section was performed and a preterm female infant was delivered. Patient had severe hypertension before delivery that required IV magnesium sulfate infusion.   Her son was in the NICU for 3  months and had necrotizing enterocolitis (no surgery was performed). He is in good health now without any neurological deficits.  PMH: No history of chronic hypertension or diabetes or any other chronic medical conditions. She does not have sickle-cell trait.  PSH: Cesarean section. Meds: Prenatal vitamins, low-dose aspirin. Allergies: NKDA. Social: Denies tobacco or drug or alcohol use. She has been married 5 years and her husband (father of her first child) is in good health. He is Tree surgeon. Family: No history of venous thromboembolism. Gyn: No history of abnormal Pap smears or cervical surgeries. No history of breast disease.  P/E: Patient is comfortably sitting up; not in distress. BP: 128-173/50-108 mm Hg. Pulse 90/min, afebrile, RR 18. HEENT: Normal; no lymphadenopathy. Abd: Soft gravid uterus; no  tenderness. Bilateral pedal edema present. NST: Cat 1 tracing.  Labs: Hb 12, Hct 35.8, WBC 8, PLT 261, ALT 17, AST 22, creatinine 0.75. Protein/creatinine ratio pending.  On ultrasound, fetal growth is appropriate for gestational age. Amniotic fluid is normal and good fetal activity is seen. Cephalic presentation. Fetal anatomy appears normal, but limited by advanced gestational age. Antenatal testing is reassuring. BPP 8/8.  I counseled the patient on the following: Gestational hypertension/preeclampsia with severe features: -I explained the diagnosis and possible complications including maternal stroke, eclampsia, pulmonary edema, end-organ damage, coagulation failure and placental abruption. -Inpatient management till delivery is recommended to prevent maternal/fetal complications. -Antihypertensives should be initiated mainly to prevent maternal complications. Antihypertensives do not improve fetal outcome. -Discussed monitoring protocol (NST and BPP). -Reassured her of normal labs. -I discussed timing of delivery. Provided no maternal and fetal complications are seen, we would recommend delivery at 34 weeks. -Patient asked appropriate questions and agreed with our recommended management. She is aware that she will be transported to the [facility] on Sunday. -Patient is aware that she is more-likely to have a cesarean delivery.  Recommendations: -Continue magnesium sulfate for a total of 48 hours. -Complete betamethasone course. -Initiate labetalol 200 mg bid for now and increase dosage as necessary. Procardia may be added if blood pressure control is difficult to achieve. -Urine output monitoring. -Twice-daily NST. -Twice-weekly BPP (Mon-Thu). -Delivery at 34 weeks.  Delivery is indicated (not limited to) for following reasons:  -Persistent and uncontrolled hypertension not responding to antihypertensives  -Abnormal labs including increasing liver enzymes or  thrombocytopenia (<100,000).  -Non-reassuring fetal heart trace.  -Oligohydramnios or severe fetal growth restriction or abnormal antenatal testings.  -Oliguria (hourly output 20 mL to 30 mL).  -Increasing creatinine (?  1.1) Severe symptoms of headache or visual spots or persistent right upper quadrant pain.  -Placental abruption.  Thank you for your consult. Please do not hesitate to contact me if you have any questions or concerns.  Consultation including face-to-face counseling: 40 min.

## 2018-07-08 NOTE — H&P (Signed)
Aahna Jules Wensel is a 28 y.o. female BF @ [redacted] weeks gestation presented to office for routine care and was found to have BP 160/100 with over 500 protein on straight urine. Pt noted elevation of BP from yesterday but did not call anyone. She also notes headaches which she thought was related to URI. (+) c/o increased leg swelling (+) 9lb weight gain since last visit.  Denies epigastric/RUQ pain or visual changes. Good FM. Pt has been on baby ASA due to prior delivery for preeclampsia @ 27wk via C/S.  OB History    Gravida  2   Para  1   Term  0   Preterm  1   AB  1   Living  1     SAB  0   TAB  0   Ectopic  0   Multiple      Live Births  1          Past Medical History:  Diagnosis Date  . Anemia   . Bacterial infection   . H/O candidiasis   . H/O cystitis   . H/O rubella   . H/O varicella   . Low iron   . Normal pregnancy, incidental 09/22/2011  . Yeast infection    Past Surgical History:  Procedure Laterality Date  . CESAREAN SECTION  11/16/2011   Procedure: CESAREAN SECTION;  Surgeon: Michael Litter, MD;  Location: [facility];  Service: Gynecology;  Laterality: N/A;  Primary cesarean section with delivery of baby boy at 5.  Apgars 6/8.  Marland Kitchen CESAREAN SECTION    . WISDOM TOOTH EXTRACTION     Family History: family history includes Cancer in her maternal aunt and maternal grandmother. Social History:  reports that she has been smoking. She has never used smokeless tobacco. She reports current alcohol use of about 1.0 standard drinks of alcohol per week. She reports that she does not use drugs.     Maternal Diabetes: No Genetic Screening: Declined Maternal Ultrasounds/Referrals: Normal Fetal Ultrasounds or other Referrals:  None Maternal Substance Abuse:  No Significant Maternal Medications:  Meds include: Other: baby ASA Significant Maternal Lab Results:  None Other Comments:  previous PTB due to preeclampsia  Review of Systems  Eyes: Negative for blurred  vision.  Cardiovascular: Positive for leg swelling.  Neurological: Positive for headaches.   Maternal Medical History:  Fetal activity: Perceived fetal activity is normal.    Prenatal Complications - Diabetes: none.      Blood pressure (!) 144/84, pulse 86, temperature 98.4 F (36.9 C), temperature source Oral, resp. rate 18, height 5\' 2"  (1.575 m), weight 118.8 kg, SpO2 100 %. Maternal Exam:  Introitus: Amniotic fluid character: not assessed.  Pelvis: adequate for delivery.      Physical Exam  Constitutional: She is oriented to person, place, and time. She appears well-developed and well-nourished.  Eyes: EOM are normal.  Neck: Neck supple.  Cardiovascular: Regular rhythm.  Respiratory: Breath sounds normal.  GI: Soft.  Gravid pfannenstiel incision  Genitourinary:    Genitourinary Comments: Vulva neg Cervix/long/closed/OOP   Musculoskeletal:        General: Edema present.  Neurological: She is alert and oriented to person, place, and time. She displays abnormal reflex.  Skin: Skin is warm and dry.  Psychiatric: She has a normal mood and affect.    Prenatal labs: ABO, Rh:  A positive Antibody:  neg Rubella:  Immune RPR:   NR HBsAg:neg    HIV:   neg GBS:  not done   Assessment/Plan: PIH suspect preeclampsia Previous hx preeclampsia@ 27 wk  prev LTCS IUP@ 28 weeks  P) admit. PIH labs. NICU/MFM consults. sono for EFW. BMZ. GBS cx. Start magnesium sulfate. 24 hr Urine collection for CrCL, UTP. Start labetalol po bid. Hypertensive protocol for severe range BP. Continuous fetal monitoring  Aishani Kalis A Gus Littler 07/08/2018, 1:59 PM

## 2018-07-08 NOTE — Progress Notes (Signed)
Patient Vitals for the past 24 hrs:  BP Temp Temp src Pulse Resp SpO2 Height Weight  07/08/18 1600 (!) 155/105 - - 95 18 100 % - -  07/08/18 1500 (!) 148/91 - - 79 18 100 % - -  07/08/18 1357 (!) 144/84 - - 86 18 100 % - -  07/08/18 1301 (!) 139/91 - - 80 18 100 % - -  07/08/18 1251 (!) 128/108 - - 75 18 100 % - -  07/08/18 1240 (!) 117/50 - - 77 18 100 % - -  07/08/18 1207 (!) 154/108 - - 75 - - 5\' 2"  (1.575 m) 118.8 kg  07/08/18 1151 (!) 173/99 98.4 F (36.9 C) Oral 74 18 100 % - -   CBC Latest Ref Rng & Units 07/08/2018 11/21/2011 11/17/2011  WBC 4.0 - 10.5 K/uL 8.0 12.1(H) 12.9(H)  Hemoglobin 12.0 - 15.0 g/dL 30.1 10.9(L) 10.2(L)  Hematocrit 36.0 - 46.0 % 35.8(L) 33.8(L) 30.9(L)  Platelets 150 - 400 K/uL 261 281 240   CMP Latest Ref Rng & Units 07/08/2018 11/21/2011 11/20/2011  Glucose 70 - 99 mg/dL 314(H) 95 88  BUN 6 - 20 mg/dL 8 15 8   Creatinine 0.44 - 1.00 mg/dL 8.88 7.57 9.72  Sodium 135 - 145 mmol/L 135 139 137  Potassium 3.5 - 5.1 mmol/L 3.5 3.9 4.3  Chloride 98 - 111 mmol/L 107 103 105  CO2 22 - 32 mmol/L 22 26 26   Calcium 8.9 - 10.3 mg/dL 8.2(S) 9.7 8.6  Total Protein 6.5 - 8.1 g/dL 6.7 6.6 6.0(R)  Total Bilirubin 0.3 - 1.2 mg/dL 5.6(F) 5.3(P) 9.4(F)  Alkaline Phos 38 - 126 U/L 94 97 83  AST 15 - 41 U/L 22 21 39(H)  ALT 0 - 44 U/L 17 27 29    Await sonogram for fetal growth NICU consult called 1:50 pm On labetalol. With labetalol hypertensive protocol

## 2018-07-08 NOTE — Plan of Care (Signed)
  Problem: Safety: Goal: Ability to remain free from injury will improve Outcome: Progressing   Problem: Education: Goal: Knowledge of disease or condition will improve Outcome: Progressing   Problem: Education: Goal: Knowledge of disease or condition will improve Outcome: Progressing

## 2018-07-09 DIAGNOSIS — Z3A28 28 weeks gestation of pregnancy: Secondary | ICD-10-CM | POA: Diagnosis not present

## 2018-07-09 DIAGNOSIS — O9952 Diseases of the respiratory system complicating childbirth: Secondary | ICD-10-CM | POA: Diagnosis present

## 2018-07-09 DIAGNOSIS — D62 Acute posthemorrhagic anemia: Secondary | ICD-10-CM | POA: Diagnosis not present

## 2018-07-09 DIAGNOSIS — O99334 Smoking (tobacco) complicating childbirth: Secondary | ICD-10-CM | POA: Diagnosis present

## 2018-07-09 DIAGNOSIS — O1414 Severe pre-eclampsia complicating childbirth: Secondary | ICD-10-CM | POA: Diagnosis not present

## 2018-07-09 DIAGNOSIS — O99214 Obesity complicating childbirth: Secondary | ICD-10-CM | POA: Diagnosis present

## 2018-07-09 DIAGNOSIS — O1493 Unspecified pre-eclampsia, third trimester: Secondary | ICD-10-CM | POA: Diagnosis not present

## 2018-07-09 DIAGNOSIS — F17200 Nicotine dependence, unspecified, uncomplicated: Secondary | ICD-10-CM | POA: Diagnosis present

## 2018-07-09 DIAGNOSIS — Z3A00 Weeks of gestation of pregnancy not specified: Secondary | ICD-10-CM | POA: Diagnosis not present

## 2018-07-09 DIAGNOSIS — O1494 Unspecified pre-eclampsia, complicating childbirth: Secondary | ICD-10-CM | POA: Diagnosis present

## 2018-07-09 DIAGNOSIS — O4103X0 Oligohydramnios, third trimester, not applicable or unspecified: Secondary | ICD-10-CM | POA: Diagnosis present

## 2018-07-09 DIAGNOSIS — O34211 Maternal care for low transverse scar from previous cesarean delivery: Secondary | ICD-10-CM | POA: Diagnosis not present

## 2018-07-09 DIAGNOSIS — R1011 Right upper quadrant pain: Secondary | ICD-10-CM | POA: Diagnosis not present

## 2018-07-09 DIAGNOSIS — O1403 Mild to moderate pre-eclampsia, third trimester: Secondary | ICD-10-CM | POA: Diagnosis not present

## 2018-07-09 DIAGNOSIS — O9081 Anemia of the puerperium: Secondary | ICD-10-CM | POA: Diagnosis not present

## 2018-07-09 DIAGNOSIS — O26893 Other specified pregnancy related conditions, third trimester: Secondary | ICD-10-CM | POA: Diagnosis not present

## 2018-07-09 DIAGNOSIS — O1413 Severe pre-eclampsia, third trimester: Secondary | ICD-10-CM | POA: Diagnosis not present

## 2018-07-09 DIAGNOSIS — Z452 Encounter for adjustment and management of vascular access device: Secondary | ICD-10-CM | POA: Diagnosis not present

## 2018-07-09 DIAGNOSIS — J45909 Unspecified asthma, uncomplicated: Secondary | ICD-10-CM | POA: Diagnosis present

## 2018-07-09 DIAGNOSIS — O4593 Premature separation of placenta, unspecified, third trimester: Secondary | ICD-10-CM | POA: Diagnosis present

## 2018-07-09 LAB — CBC
HCT: 38.3 % (ref 36.0–46.0)
Hemoglobin: 12.5 g/dL (ref 12.0–15.0)
MCH: 27.4 pg (ref 26.0–34.0)
MCHC: 32.6 g/dL (ref 30.0–36.0)
MCV: 84 fL (ref 80.0–100.0)
Platelets: 289 10*3/uL (ref 150–400)
RBC: 4.56 MIL/uL (ref 3.87–5.11)
RDW: 14.8 % (ref 11.5–15.5)
WBC: 10.2 10*3/uL (ref 4.0–10.5)
nRBC: 0 % (ref 0.0–0.2)

## 2018-07-09 LAB — COMPREHENSIVE METABOLIC PANEL
ALT: 17 U/L (ref 0–44)
AST: 21 U/L (ref 15–41)
Albumin: 2.9 g/dL — ABNORMAL LOW (ref 3.5–5.0)
Alkaline Phosphatase: 97 U/L (ref 38–126)
Anion gap: 8 (ref 5–15)
BUN: 7 mg/dL (ref 6–20)
CO2: 21 mmol/L — ABNORMAL LOW (ref 22–32)
Calcium: 7.8 mg/dL — ABNORMAL LOW (ref 8.9–10.3)
Chloride: 105 mmol/L (ref 98–111)
Creatinine, Ser: 0.67 mg/dL (ref 0.44–1.00)
GFR calc Af Amer: >60 mL/min (ref >60)
GFR calc non Af Amer: >60 mL/min (ref >60)
Glucose, Bld: 111 mg/dL — ABNORMAL HIGH (ref 70–99)
Potassium: 4.3 mmol/L (ref 3.5–5.1)
Sodium: 134 mmol/L — ABNORMAL LOW (ref 135–145)
Total Bilirubin: 0.3 mg/dL (ref 0.3–1.2)
Total Protein: 7 g/dL (ref 6.5–8.1)

## 2018-07-09 LAB — CREATININE CLEARANCE, URINE, 24 HOUR
Collection Interval-CRCL: 24 hours
Creatinine Clearance: 145 mL/min — ABNORMAL HIGH (ref 75–115)
Creatinine, 24H Ur: 1401 mg/d (ref 600–1800)
Creatinine, Urine: 71.83 mg/dL
Urine Total Volume-CRCL: 1950 mL

## 2018-07-09 LAB — PROTEIN, URINE, 24 HOUR
Collection Interval-UPROT: 24 hours
Protein, 24H Urine: 4290 mg/d — ABNORMAL HIGH (ref 50–100)
Protein, Urine: 220 mg/dL
Urine Total Volume-UPROT: 1950 mL

## 2018-07-09 LAB — URIC ACID: Uric Acid, Serum: 5.1 mg/dL (ref 2.5–7.1)

## 2018-07-09 LAB — PROTEIN / CREATININE RATIO, URINE
Creatinine, Urine: 75 mg/dL
Protein Creatinine Ratio: 2.92 mg/mg{Cre} — ABNORMAL HIGH (ref 0.00–0.15)
Total Protein, Urine: 219 mg/dL

## 2018-07-09 LAB — RPR: RPR Ser Ql: NONREACTIVE

## 2018-07-09 MED ORDER — LABETALOL HCL 200 MG PO TABS
200.0000 mg | ORAL_TABLET | Freq: Two times a day (BID) | ORAL | Status: DC
  Administered 2018-07-09 – 2018-07-11 (×6): 200 mg via ORAL
  Filled 2018-07-09 (×6): qty 1

## 2018-07-09 NOTE — Progress Notes (Signed)
HD # 2 28 1/7 wk Preeclampsia BMZ #1 Magnesium S: notes some h/a which she relates to magnesium. (+) FM denies visual changes or epigastric pain O:  Patient Vitals for the past 24 hrs:  BP Temp Temp src Pulse Resp SpO2 Height Weight  07/09/18 0754 (!) 130/95 97.7 F (36.5 C) Oral 96 20 100 % - -  07/09/18 0700 - - - - 16 - - -  07/09/18 0600 - - - - 17 - - -  07/09/18 0530 - - - - - - - 120.2 kg  07/09/18 0501 (!) 148/91 98 F (36.7 C) Oral 96 18 99 % - -  07/09/18 0500 - - - - 18 - - -  07/09/18 0422 (!) 146/97 97.9 F (36.6 C) Oral 92 17 99 % - -  07/09/18 0400 - - - - 17 - - -  07/09/18 0300 - - - - 16 - - -  07/09/18 0200 - - - - 17 - - -  07/09/18 0118 (!) 145/97 97.9 F (36.6 C) Oral 89 18 100 % - -  07/09/18 0000 - - - - 17 - - -  07/08/18 2300 - - - - 18 - - -  07/08/18 2200 - - - - 18 - - -  07/08/18 2100 140/84 - - 88 17 - - -  07/08/18 2001 (!) 141/89 - - 91 16 - - -  07/08/18 2000 (!) 141/89 97.9 F (36.6 C) Oral 85 19 98 % - -  07/08/18 1901 (!) 141/82 - - 90 - - - -  07/08/18 1823 - - - - 18 - - -  07/08/18 1800 (!) 141/86 98.4 F (36.9 C) Oral 94 18 100 % - -  07/08/18 1716 134/75 - - 90 - - - -  07/08/18 1709 (!) 154/94 - - 90 18 - - -  07/08/18 1702 (!) 159/93 - - 100 18 - - -  07/08/18 1600 (!) 155/105 98 F (36.7 C) Oral 95 18 100 % - -  07/08/18 1500 (!) 148/91 - - 79 18 100 % - -  07/08/18 1357 (!) 144/84 - - 86 18 100 % - -  07/08/18 1301 (!) 139/91 - - 80 18 100 % - -  07/08/18 1251 (!) 128/108 - - 75 18 100 % - -  07/08/18 1240 (!) 117/50 - - 77 18 100 % - -  07/08/18 1207 (!) 154/108 - - 75 - - 5\' 2"  (1.575 m) 118.8 kg  07/08/18 1151 (!) 173/99 98.4 F (36.9 C) Oral 74 18 100 % - -  lungs clear to A  Cor RRR abd gravid soft plevic deferred. Extr. +1 edema DTR 2+ (-) clonus  Sonogram : pending Tracing: baseline 135 good variability. occ variable No ctx IMP: Preeclampsia with severe features( BP) on magnesium Prev LTCS IUP @ 28 1/7  weeks P) cont inpt for duration of pregnancy. Increase labetalol to 200 mg po bid. Repeat PIH labs this am. Complete 24 hr magnesium sulfate after 2nd BMZ done. Change to q shift monitoring. Await 24 hr UTP/CrCL results( complete collection this pm)

## 2018-07-10 LAB — COMPREHENSIVE METABOLIC PANEL
ALT: 16 U/L (ref 0–44)
AST: 20 U/L (ref 15–41)
Albumin: 2.6 g/dL — ABNORMAL LOW (ref 3.5–5.0)
Alkaline Phosphatase: 82 U/L (ref 38–126)
Anion gap: 7 (ref 5–15)
BUN: 10 mg/dL (ref 6–20)
CO2: 20 mmol/L — ABNORMAL LOW (ref 22–32)
Calcium: 7.8 mg/dL — ABNORMAL LOW (ref 8.9–10.3)
Chloride: 106 mmol/L (ref 98–111)
Creatinine, Ser: 0.66 mg/dL (ref 0.44–1.00)
GFR calc Af Amer: >60 mL/min (ref >60)
GFR calc non Af Amer: >60 mL/min (ref >60)
Glucose, Bld: 116 mg/dL — ABNORMAL HIGH (ref 70–99)
Potassium: 4.7 mmol/L (ref 3.5–5.1)
Sodium: 133 mmol/L — ABNORMAL LOW (ref 135–145)
Total Bilirubin: 0.3 mg/dL (ref 0.3–1.2)
Total Protein: 6.2 g/dL — ABNORMAL LOW (ref 6.5–8.1)

## 2018-07-10 LAB — CBC
HCT: 33.4 % — ABNORMAL LOW (ref 36.0–46.0)
Hemoglobin: 10.7 g/dL — ABNORMAL LOW (ref 12.0–15.0)
MCH: 27.2 pg (ref 26.0–34.0)
MCHC: 32 g/dL (ref 30.0–36.0)
MCV: 85 fL (ref 80.0–100.0)
Platelets: 273 10*3/uL (ref 150–400)
RBC: 3.93 MIL/uL (ref 3.87–5.11)
RDW: 14.8 % (ref 11.5–15.5)
WBC: 10.4 10*3/uL (ref 4.0–10.5)
nRBC: 0 % (ref 0.0–0.2)

## 2018-07-10 LAB — CULTURE, BETA STREP (GROUP B ONLY)

## 2018-07-10 MED ORDER — ASPIRIN 81 MG PO CHEW
81.0000 mg | CHEWABLE_TABLET | Freq: Every day | ORAL | Status: DC
  Administered 2018-07-10 – 2018-07-13 (×4): 81 mg via ORAL
  Filled 2018-07-10 (×5): qty 1

## 2018-07-10 NOTE — Progress Notes (Signed)
S; no h/a. Visual changes (+) FM  wants to know about going out of hospital to visit baby who is crying in her absence  O: Patient Vitals for the past 24 hrs:  BP Temp Temp src Pulse Resp SpO2 Weight  07/10/18 0801 (!) 148/108 97.9 F (36.6 C) Oral 79 17 100 % -  07/10/18 0521 - - - - - - 121.1 kg  07/10/18 0403 128/82 98.2 F (36.8 C) Oral 79 17 99 % -  07/09/18 2319 (!) 142/90 98 F (36.7 C) Oral 84 16 100 % -  07/09/18 2140 (!) 153/83 - - 91 - 100 % -  07/09/18 1942 140/83 98.1 F (36.7 C) Oral 84 17 100 % -  07/09/18 1641 (!) 139/99 98.1 F (36.7 C) Oral 92 18 100 % -  07/09/18 1300 - - - - 18 - -  07/09/18 1226 (!) 141/91 97.7 F (36.5 C) Oral 84 18 100 % -  07/09/18 1100 - - - - 18 - -  07/09/18 1000 - - - - 18 - -  07/09/18 0900 - - - - 18 - -  lungs clear to A Cor RRR  abd gravid soft Extr. +1 edema. No calf tenderness. No clonus DTR 2+ Tracing: baseline 150 NR (-)ctx  CMP Latest Ref Rng & Units 07/10/2018 07/09/2018 07/08/2018  Glucose 70 - 99 mg/dL 497(N) 300(F) 110(Y)  BUN 6 - 20 mg/dL 10 7 8   Creatinine 0.44 - 1.00 mg/dL 1.11 7.35 6.70  Sodium 135 - 145 mmol/L 133(L) 134(L) 135  Potassium 3.5 - 5.1 mmol/L 4.7 4.3 3.5  Chloride 98 - 111 mmol/L 106 105 107  CO2 22 - 32 mmol/L 20(L) 21(L) 22  Calcium 8.9 - 10.3 mg/dL 7.8(L) 7.8(L) 8.5(L)  Total Protein 6.5 - 8.1 g/dL 6.2(L) 7.0 6.7  Total Bilirubin 0.3 - 1.2 mg/dL 0.3 0.3 1.4(D)  Alkaline Phos 38 - 126 U/L 82 97 94  AST 15 - 41 U/L 20 21 22   ALT 0 - 44 U/L 16 17 17    CBC Latest Ref Rng & Units 07/10/2018 07/09/2018 07/08/2018  WBC 4.0 - 10.5 K/uL 10.4 10.2 8.0  Hemoglobin 12.0 - 15.0 g/dL 10.7(L) 12.5 12.0  Hematocrit 36.0 - 46.0 % 33.4(L) 38.3 35.8(L)  Platelets 150 - 400 K/uL 273 289 261  UTP 4290 mg/24 hr crcl 131ml/min  IMP: severe preeclampsia IUP@ 28 2/7 weeks Previous C/S P) cont inpt mgmt. Repeat PIH labs. ANA( due to proteinuria). NST TID. Pt aware transfer to new facility in am

## 2018-07-11 MED ORDER — FAMOTIDINE 20 MG PO TABS
20.0000 mg | ORAL_TABLET | Freq: Two times a day (BID) | ORAL | Status: DC
  Administered 2018-07-11 – 2018-07-13 (×5): 20 mg via ORAL
  Filled 2018-07-11 (×5): qty 1

## 2018-07-11 MED ORDER — POLYSACCHARIDE IRON COMPLEX 150 MG PO CAPS
150.0000 mg | ORAL_CAPSULE | Freq: Every day | ORAL | Status: DC
  Administered 2018-07-11 – 2018-07-17 (×6): 150 mg via ORAL
  Filled 2018-07-11 (×6): qty 1

## 2018-07-11 MED ORDER — CALCIUM CARBONATE ANTACID 500 MG PO CHEW
1.0000 | CHEWABLE_TABLET | ORAL | Status: DC | PRN
  Administered 2018-07-11 – 2018-07-13 (×2): 200 mg via ORAL
  Filled 2018-07-11 (×2): qty 1

## 2018-07-11 NOTE — Progress Notes (Signed)
HD # 4 S: notes increased swelling since last night  Denies h/a, visual changes or epigastric pain (+) FM BP (!) 143/89 (BP Location: Right Arm)   Pulse 75   Temp 98.3 F (36.8 C)   Resp 17   Ht 5\' 2"  (1.575 m)   Wt 121.1 kg   LMP 12/24/2017 (Exact Date)   SpO2 99%   BMI 48.84 kg/m   Lungs clear to A  Cor RRR Abd; gravid nontender extr 2+ edema DTR 2+ no clonus  Tracing> baseline 140 nr No ctx  IMP: severe preeclampsia IUP @ 28 3/7 weeks on labetalol BMZ complete Previous C/S P) cont with inpt. NST TID. Cont with labetalol. BPP/AFI  This week ANA pending. Watch closely due to increase leg swelling noted today. Daily weight pending

## 2018-07-11 NOTE — Progress Notes (Signed)
Patient received into room 115...oriented to room.

## 2018-07-11 NOTE — Progress Notes (Signed)
Report given to [employer].  Patient ready for transport.

## 2018-07-12 ENCOUNTER — Inpatient Hospital Stay (HOSPITAL_COMMUNITY): Payer: BLUE CROSS/BLUE SHIELD

## 2018-07-12 DIAGNOSIS — Z3A28 28 weeks gestation of pregnancy: Secondary | ICD-10-CM

## 2018-07-12 DIAGNOSIS — O1413 Severe pre-eclampsia, third trimester: Secondary | ICD-10-CM

## 2018-07-12 LAB — COMPREHENSIVE METABOLIC PANEL
ALT: 16 U/L (ref 0–44)
AST: 21 U/L (ref 15–41)
Albumin: 2.2 g/dL — ABNORMAL LOW (ref 3.5–5.0)
Alkaline Phosphatase: 68 U/L (ref 38–126)
Anion gap: 9 (ref 5–15)
BUN: 11 mg/dL (ref 6–20)
CO2: 22 mmol/L (ref 22–32)
Calcium: 8.7 mg/dL — ABNORMAL LOW (ref 8.9–10.3)
Chloride: 106 mmol/L (ref 98–111)
Creatinine, Ser: 0.86 mg/dL (ref 0.44–1.00)
GFR calc Af Amer: >60 mL/min (ref >60)
GFR calc non Af Amer: >60 mL/min (ref >60)
Glucose, Bld: 79 mg/dL (ref 70–99)
Potassium: 4 mmol/L (ref 3.5–5.1)
Sodium: 137 mmol/L (ref 135–145)
Total Bilirubin: 0.3 mg/dL (ref 0.3–1.2)
Total Protein: 5.3 g/dL — ABNORMAL LOW (ref 6.5–8.1)

## 2018-07-12 LAB — CBC
HCT: 32.9 % — ABNORMAL LOW (ref 36.0–46.0)
Hemoglobin: 10.8 g/dL — ABNORMAL LOW (ref 12.0–15.0)
MCH: 27.3 pg (ref 26.0–34.0)
MCHC: 32.8 g/dL (ref 30.0–36.0)
MCV: 83.1 fL (ref 80.0–100.0)
Platelets: 260 10*3/uL (ref 150–400)
RBC: 3.96 MIL/uL (ref 3.87–5.11)
RDW: 14.6 % (ref 11.5–15.5)
WBC: 11.8 10*3/uL — ABNORMAL HIGH (ref 4.0–10.5)
nRBC: 0.4 % — ABNORMAL HIGH (ref 0.0–0.2)

## 2018-07-12 LAB — URIC ACID: Uric Acid, Serum: 6.3 mg/dL (ref 2.5–7.1)

## 2018-07-12 LAB — ANA W/REFLEX IF POSITIVE: Anti Nuclear Antibody(ANA): NEGATIVE

## 2018-07-12 MED ORDER — HYDRALAZINE HCL 50 MG PO TABS
50.0000 mg | ORAL_TABLET | Freq: Four times a day (QID) | ORAL | Status: DC
  Administered 2018-07-12 – 2018-07-17 (×19): 50 mg via ORAL
  Filled 2018-07-12 (×19): qty 1

## 2018-07-12 MED ORDER — LABETALOL HCL 200 MG PO TABS
300.0000 mg | ORAL_TABLET | Freq: Two times a day (BID) | ORAL | Status: DC
  Administered 2018-07-12 – 2018-07-17 (×10): 300 mg via ORAL
  Filled 2018-07-12 (×10): qty 1

## 2018-07-12 MED ORDER — ACETAMINOPHEN 500 MG PO TABS
1000.0000 mg | ORAL_TABLET | Freq: Once | ORAL | Status: AC
  Administered 2018-07-12: 1000 mg via ORAL
  Filled 2018-07-12: qty 2

## 2018-07-12 NOTE — Consult Note (Signed)
Maternal-Fetal Medicine (Follow-up consultation) Requesting Provider: Dr. Maxie Better, MD  Chloe Graves, G3 P1 at 28w 4d was admitted 4 days ago with severe range blood pressures. She has preeclampsia with severe features.  Patient received IV labetalol (complete antihypertensive regimen as per protocol) hydralazine this morning because of severe range blood pressures. She is currently on labetalol 300 mg bid and hydralazine 50 mg q 6 hours. She does not have severe headache or visual disturbances or right upper quadrant pain or vaginal bleeding.   P/E: Patient is comfortably sitting up after ultrasound; not in distress. BP (12 hours): 155-179/88-98 mm Hg.  Abd: Soft gravid uterus; no tenderness. NST is reactive for this gestational age.  Hb 10.8, Hct 32.9, WBC 11.8, PLT 260, AST 21, ALT 16, creatinine 0.86, electrolytes normal, protein/creatinine 2.92.  Ultrasound: Amniotic fluid is normal and good fetal activity is seen. Cephalic presentation. Antenatal testing is reassuring.  I counseled the patient on the severity of hypertension that required IV antihypertensives. She is aware of the possible maternal and fetal complications from uncontrolled hypertension.  Recent increase in dosage of labetalol and addition of hydralazine was ordered by Dr. Cherly Hensen with an intention to prolong expectant management. I informed the patient that if blood pressures are not controlled with current regimen and if severe range blood pressures recur, delivery would be recommended.  Recommendations: -Close monitoring of blood pressures. -If severe range blood pressures recur more than once with current regimen of antihypertensives, proceed to delivery. -NPO for now. -Continuous monitoring (NST) for now. -Magnesium sulfate to be started if delivery is planned and continued 24-hours postpartum.  Consultation including face-to-tace time 30 minutes.

## 2018-07-12 NOTE — Progress Notes (Signed)
I spent some time with Chloe Graves today after she had an MFM consult, which indicated she may need to deliver her baby ASAP due to her blood pressure.  Chloe Graves reports that her doctor is going to give her medication a little longer, but will deliver if she gets one more bad reading.  Chloe Graves shared that she was pretty upset at first, but is starting to feel okay.  Her son, Chloe Graves, was born 6 years ago at 38 weeks and is thriving now.  She's worried because every baby is different and she knows how fortunate Chloe Graves is, but she is grateful that she's been able to get some steroids in and feels that she's at a better starting place.  We discussed the benefits of the new NICU space and she's grateful she'll be able to stay with her baby some.  Chloe Graves also shared how sad she is to be away from Chloe Graves.  Her sister brought him to stand outside of her window yesterday and she says it was the calmest she's felt being able to see him, but it also made Chloe Graves cry that they couldn' thug one another.  We discussed techniques for stress management and coping and Chloe Graves has good self care tools.  Please page as further needs arise.  Chloe Graves. Chloe Graves, M.Div. [facility] Pager [phone] Office [phone]

## 2018-07-12 NOTE — Progress Notes (Signed)
28 4/7 wk Severe preeclampsia S; denies h/a, visual changes. Notes decrease in leg swelling as well as weight. (+) FM O.  Patient Vitals for the past 24 hrs:  BP Temp Temp src Pulse Resp SpO2 Weight  07/12/18 0656 - - - - 20 - -  07/12/18 0654 (!) 155/88 - - 73 - 100 % -  07/12/18 0635 (!) 179/95 - - 65 - - -  07/12/18 0615 (!) 174/98 - - 70 - - -  07/12/18 0549 (!) 176/94 - - 66 - 100 % -  07/12/18 0533 (!) 179/94 - - 64 - - -  07/12/18 0532 - - - - 16 - -  07/12/18 0522 (!) 180/94 - - 64 - - -  07/12/18 0521 - - - - 16 - -  07/12/18 0501 (!) 178/91 - - 66 - - -  07/12/18 0500 - - - - - - 119.5 kg  07/12/18 0455 (!) 185/91 - - 66 - - -  07/12/18 0439 (!) 174/79 - - 63 - - -  07/12/18 0433 (!) 168/94 - - 66 - - -  07/12/18 0428 (!) 168/94 98.2 F (36.8 C) Oral 70 18 100 % -  07/11/18 2208 (!) 156/88 98.1 F (36.7 C) - 77 20 100 % -  07/11/18 1931 (!) 156/83 99.1 F (37.3 C) Oral 65 20 100 % -  07/11/18 1609 (!) 156/85 - - - - - -  07/11/18 1554 (!) 163/81 98 F (36.7 C) Oral 65 20 100 % -  07/11/18 1200 - - - - - - 120.9 kg  07/11/18 1150 (!) 154/92 98.3 F (36.8 C) Oral 78 20 100 % -  lungs clear to A  Cor RRR abd gravid non tender  ext DTR 2+ tr edema  Tracing: baseline 145  No decels  no ctx  CBC Latest Ref Rng & Units 07/12/2018 07/10/2018 07/09/2018  WBC 4.0 - 10.5 K/uL 11.8(H) 10.4 10.2  Hemoglobin 12.0 - 15.0 g/dL 10.8(L) 10.7(L) 12.5  Hematocrit 36.0 - 46.0 % 32.9(L) 33.4(L) 38.3  Platelets 150 - 400 K/uL 260 273 289   CMP Latest Ref Rng & Units 07/12/2018 07/10/2018 07/09/2018  Glucose 70 - 99 mg/dL 79 381(R) 711(A)  BUN 6 - 20 mg/dL 11 10 7   Creatinine 0.44 - 1.00 mg/dL 5.79 0.38 3.33  Sodium 135 - 145 mmol/L 137 133(L) 134(L)  Potassium 3.5 - 5.1 mmol/L 4.0 4.7 4.3  Chloride 98 - 111 mmol/L 106 106 105  CO2 22 - 32 mmol/L 22 20(L) 21(L)  Calcium 8.9 - 10.3 mg/dL 8.3(A) 7.8(L) 7.8(L)  Total Protein 6.5 - 8.1 g/dL 5.3(L) 6.2(L) 7.0  Total Bilirubin 0.3 - 1.2  mg/dL 0.3 0.3 0.3  Alkaline Phos 38 - 126 U/L 68 82 97  AST 15 - 41 U/L 21 20 21   ALT 0 - 44 U/L 16 16 17   IMP: severe preeclampsia on labetalol and hydralazine IUP@ 28 4/7 weeks S/p magnesium. BMZ complete  prev C/S P) cont inpt. Watch BP. sono for BPP/AFI. Pt aware if BP remains in severe range despite change in med, then will need to be delivered

## 2018-07-13 LAB — ABO/RH: ABO/RH(D): A POS

## 2018-07-13 NOTE — Progress Notes (Signed)
28 5/7 weeks  S; notes sl headache transinet with hydralazine use which resolves spontaneously Active fetus  O:  Patient Vitals for the past 24 hrs:  BP Temp Temp src Pulse Resp SpO2  07/13/18 1144 (!) 141/79 98 F (36.7 C) Oral 72 20 100 %  07/13/18 0929 (!) 157/90 97.7 F (36.5 C) Oral 92 20 100 %  07/13/18 0417 (!) 157/89 98 F (36.7 C) Oral 82 18 100 %  07/12/18 2332 (!) 120/93 98.6 F (37 C) Oral 80 18 100 %  07/12/18 2044 (!) 155/89 97.7 F (36.5 C) Oral 77 18 100 %  07/12/18 1747 139/89 97.6 F (36.4 C) Oral 74 - 100 %  07/12/18 1320 (!) 153/82 - - 67 - -  lungs clear to P Cor RRR Abd gravid non tender  extr 1+ edema DTR 2+   Tracing: baseline 145 no decels. Good variability No ctx  [place] Mfm Fetal Bpp Wo Non Stress  Result Date: 07/12/2018 ----------------------------------------------------------------------  OBSTETRICS REPORT                       (Signed Final 07/12/2018 11:14 am) ---------------------------------------------------------------------- Patient Info  ID #:       [id number]                          D.O.B.:  October 30, 1990 (27 yrs)  Name:       Chloe Graves Sens              Visit Date: 07/12/2018 09:50 am ---------------------------------------------------------------------- Performed By  Performed By:     Hurman Horn          Ref. Address:     [facility]                    RDMS                                                             OB/GYN &                                                             [facility]                                                             [address]                                                             [place], [place]  [address]  Attending:        Noralee Space MD        Location:         [facility]  Referred By:      Nena Jordan                    Adelai Achey MD ---------------------------------------------------------------------- Orders   #  Description                           Code         Ordered By   1  [place] MFM FETAL BPP WO NON              76819.01     Bandy Honaker      STRESS                                            Cheria Sadiq  ----------------------------------------------------------------------   #  Order #                    Accession #                 Episode #   1  [id number]                  [id number]                  [id number]  ---------------------------------------------------------------------- Indications   [redacted] weeks gestation of pregnancy                Z3A.28  ---------------------------------------------------------------------- Vital Signs                                                 Height:        5'2" ---------------------------------------------------------------------- Fetal Evaluation  Num Of Fetuses:         1  Fetal Heart Rate(bpm):  159  Cardiac Activity:       Observed  Presentation:           Cephalic  Placenta:               Anterior  Amniotic Fluid  AFI FV:      Within normal limits  AFI Sum(cm)     %Tile       Largest Pocket(cm)  13.24           39          5.29  RUQ(cm)       RLQ(cm)       LUQ(cm)        LLQ(cm)  5.29          2.88          2.35           2.72 ---------------------------------------------------------------------- Biophysical Evaluation  Amniotic F.V:   Within normal limits       F. Tone:        Observed  F. Movement:    Observed                   Score:          8/8  F. Breathing:   Observed ----------------------------------------------------------------------  OB History  Gravidity:    3         Prem:   1         SAB:   1  Living:       1 ---------------------------------------------------------------------- Gestational Age  LMP:           28w 4d        Date:  12/24/17                 EDD:   09/30/18  Best:          Eden Emms 4d     Det. By:  LMP  (12/24/17)          EDD:   09/30/18 ---------------------------------------------------------------------- Impression  Amniotic fluid is normal and good fetal activity is seen.   Cephalic presentation. Antenatal testing is reassuring. BPP  8/8.  See follow-up consult note in EPIC. ----------------------------------------------------------------------                  Noralee Space, MD Electronically Signed Final Report   07/12/2018 11:14 am ----------------------------------------------------------------------  [place] Mfm Fetal Bpp Wo Non Stress  Result Date: 07/08/2018 ----------------------------------------------------------------------  OBSTETRICS REPORT                       (Signed Final 07/08/2018 05:49 pm) ---------------------------------------------------------------------- Patient Info  ID #:       [id number]                          D.O.B.:  Jan 17, 1991 (27 yrs)  Name:       Chloe Graves              Visit Date: 07/08/2018 04:03 pm ---------------------------------------------------------------------- Performed By  Performed By:     Lenise Arena        Ref. Address:     [facility]                    RDMS                                                             OB[facility]GYN &                                                             [facility]                                                             [address]                                                             [place], [place]  [address]  Attending:        Noralee Space MD        Location:         [facility]  Referred By:      Nena Jordan                    Nasira Janusz MD ---------------------------------------------------------------------- Orders   #  Description                          Code         Ordered By   1  [place] MFM OB DETAIL +14 WK              76811.01     Levana Minetti   2  [place] MFM FETAL BPP WO NON              76819.01     Bohden Dung      STRESS                                            Morgan Keinath  ----------------------------------------------------------------------   #  Order #                     Accession #                 Episode #   1  [id number]                  [id number]                  [id number]   2  [id number]                  [id number]                  [id number]  ---------------------------------------------------------------------- Indications   History of cesarean delivery, currently        O34.219   pregnant   Poor obstetric history: Previous preterm       O09.219   delivery, antepartum @[redacted]w[redacted]d  - CS due to   Preeclampsia   Encounter for antenatal screening for          Z36.3   malformations   Severe preeclampsia, third trimester           O14.13   [redacted] weeks gestation of pregnancy                Z3A.28  ---------------------------------------------------------------------- Vital Signs  Weight (lb): 262                               Height:        5'2"  BMI:         47.92 ---------------------------------------------------------------------- Fetal Evaluation  Num Of Fetuses:         1  Fetal Heart Rate(bpm):  146  Cardiac Activity:       Observed  Presentation:  Cephalic  Placenta:               Anterior  P. Cord Insertion:      Visualized, central  Amniotic Fluid  AFI FV:      Within normal limits  AFI Sum(cm)     %Tile       Largest Pocket(cm)  12.68           33          3.57  RUQ(cm)       RLQ(cm)       LUQ(cm)        LLQ(cm)  2.49          3.55          3.57           3.07 ---------------------------------------------------------------------- Biophysical Evaluation  Amniotic F.V:   Within normal limits       F. Tone:        Observed  F. Movement:    Observed                   Score:          8/8  F. Breathing:   Observed ---------------------------------------------------------------------- Biometry  BPD:      71.5  mm     G. Age:  28w 5d         62  %    CI:        79.59   %    70 - 86                                                          FL/HC:      19.8   %    18.8 - 20.6  HC:      253.3  mm     G. Age:  27w 4d         10  %    HC/AC:      1.08        1.05 - 1.21   AC:      235.5  mm     G. Age:  27w 6d         39  %    FL/BPD:     70.2   %    71 - 87  FL:       50.2  mm     G. Age:  27w 0d         12  %    FL/AC:      21.3   %    20 - 24  HUM:      45.5  mm     G. Age:  26w 6d         22  %  Est. FW:    1097  gm      2 lb 7 oz     42  % ---------------------------------------------------------------------- OB History  Gravidity:    3         Prem:   1         SAB:   1  Living:       1 ---------------------------------------------------------------------- Gestational Age  LMP:           28w 0d  Date:  12/24/17                 EDD:   09/30/18  U/S Today:     27w 6d                                        EDD:   10/01/18  Best:          28w 0d     Det. By:  LMP  (12/24/17)          EDD:   09/30/18 ---------------------------------------------------------------------- Anatomy  Cranium:               Appears normal         Aortic Arch:            Appears normal  Cavum:                 Appears normal         Ductal Arch:            Appears normal  Ventricles:            Not well visualized    Diaphragm:              Appears normal  Choroid Plexus:        Appears normal         Stomach:                Appears normal, left                                                                        sided  Cerebellum:            Appears normal         Abdomen:                Appears normal  Posterior Fossa:       Not well visualized    Abdominal Wall:         Appears nml (cord                                                                        insert, abd wall)  Nuchal Fold:           Not well visualized    Cord Vessels:           Appears normal (3                                                                        vessel cord)  Face:  Orbits appear          Kidneys:                Appear normal                         normal  Lips:                  Appears normal         Bladder:                Appears normal  Thoracic:              Appears normal         Spine:                   Not well visualized  Heart:                 Appears normal         Upper Extremities:      Appears normal                         (4CH, axis, and                         situs)  RVOT:                  Appears normal         Lower Extremities:      Appears normal  LVOT:                  Appears normal  Other:  Technically difficult due to maternal habitus and fetal position. ---------------------------------------------------------------------- Cervix Uterus Adnexa  Cervix  Not visualized (advanced GA >24wks)  Uterus  No abnormality visualized.  Left Ovary  Not visualized.  Right Ovary  Not visualized.  Cul De Sac  No free fluid seen.  Adnexa  No abnormality visualized. ---------------------------------------------------------------------- Impression  On ultrasound, fetal growth is appropriate for gestational age.  Amniotic fluid is normal and good fetal activity is seen.  Cephalic presentation. Fetal anatomy appears normal, but  limited by advanced gestational age. Antenatal testing is  reassuring. BPP 8/8.  Placenta is anterior and there is no evidence of previa or  accreta.  See consultation note in EPIC. ---------------------------------------------------------------------- Recommendations  -Twice-weekly BPP (Mon-Thu). ----------------------------------------------------------------------                  Noralee Space, MD Electronically Signed Final Report   07/08/2018 05:49 pm ----------------------------------------------------------------------  [place] Mfm Ob Detail +14 Wk  Result Date: 07/08/2018 ----------------------------------------------------------------------  OBSTETRICS REPORT                       (Signed Final 07/08/2018 05:49 pm) ---------------------------------------------------------------------- Patient Info  ID #:       [id number]                          D.O.B.:  [birth year] (27 yrs)  Name:       Chloe Jude Stadel              Visit Date: 07/08/2018 04:03 pm  ---------------------------------------------------------------------- Performed By  Performed By:     Lenise Arena        Ref. Address:     [facility]  RDMS                                                             OB[facility]GYN [facility]                                                             [facility]                                                             [address]                                                             [place], [place]                                                             [address]  Attending:        Noralee Space MD        Location:         [facility]  Referred By:      Nena Jordan                    Abed Schar MD ---------------------------------------------------------------------- Orders   #  Description                          Code         Ordered By   1  [place] MFM OB DETAIL +14 WK              76811.01     Jacob Cicero   2  [place] MFM FETAL BPP WO NON              76819.01     Osher Oettinger      STRESS                                            Sherida Dobkins  ----------------------------------------------------------------------   #  Order #  Accession #                 Episode #   1  [id number]                  [id number]                  [id number]   2  [id number]                  [id number]                  [id number]  ---------------------------------------------------------------------- Indications   History of cesarean delivery, currently        O34.219   pregnant   Poor obstetric history: Previous preterm       O09.219   delivery, antepartum @[redacted]w[redacted]d  - CS due to   Preeclampsia   Encounter for antenatal screening for          Z36.3   malformations   Severe preeclampsia, third trimester           O14.13   [redacted] weeks gestation of pregnancy                Z3A.28  ---------------------------------------------------------------------- Vital Signs  Weight (lb): 262                                Height:        5'2"  BMI:         47.92 ---------------------------------------------------------------------- Fetal Evaluation  Num Of Fetuses:         1  Fetal Heart Rate(bpm):  146  Cardiac Activity:       Observed  Presentation:           Cephalic  Placenta:               Anterior  P. Cord Insertion:      Visualized, central  Amniotic Fluid  AFI FV:      Within normal limits  AFI Sum(cm)     %Tile       Largest Pocket(cm)  12.68           33          3.57  RUQ(cm)       RLQ(cm)       LUQ(cm)        LLQ(cm)  2.49          3.55          3.57           3.07 ---------------------------------------------------------------------- Biophysical Evaluation  Amniotic F.V:   Within normal limits       F. Tone:        Observed  F. Movement:    Observed                   Score:          8/8  F. Breathing:   Observed ---------------------------------------------------------------------- Biometry  BPD:      71.5  mm     G. Age:  28w 5d         62  %    CI:        79.59   %    70 - 86  FL/HC:      19.8   %    18.8 - 20.6  HC:      253.3  mm     G. Age:  27w 4d         10  %    HC/AC:      1.08        1.05 - 1.21  AC:      235.5  mm     G. Age:  27w 6d         39  %    FL/BPD:     70.2   %    71 - 87  FL:       50.2  mm     G. Age:  27w 0d         12  %    FL/AC:      21.3   %    20 - 24  HUM:      45.5  mm     G. Age:  26w 6d         22  %  Est. FW:    1097  gm      2 lb 7 oz     42  % ---------------------------------------------------------------------- OB History  Gravidity:    3         Prem:   1         SAB:   1  Living:       1 ---------------------------------------------------------------------- Gestational Age  LMP:           28w 0d        Date:  12/24/17                 EDD:   09/30/18  U/S Today:     27w 6d                                        EDD:   10/01/18  Best:          Eden Emms 0d     Det. By:  LMP  (12/24/17)          EDD:   09/30/18  ---------------------------------------------------------------------- Anatomy  Cranium:               Appears normal         Aortic Arch:            Appears normal  Cavum:                 Appears normal         Ductal Arch:            Appears normal  Ventricles:            Not well visualized    Diaphragm:              Appears normal  Choroid Plexus:        Appears normal         Stomach:                Appears normal, left  sided  Cerebellum:            Appears normal         Abdomen:                Appears normal  Posterior Fossa:       Not well visualized    Abdominal Wall:         Appears nml (cord                                                                        insert, abd wall)  Nuchal Fold:           Not well visualized    Cord Vessels:           Appears normal (3                                                                        vessel cord)  Face:                  Orbits appear          Kidneys:                Appear normal                         normal  Lips:                  Appears normal         Bladder:                Appears normal  Thoracic:              Appears normal         Spine:                  Not well visualized  Heart:                 Appears normal         Upper Extremities:      Appears normal                         (4CH, axis, and                         situs)  RVOT:                  Appears normal         Lower Extremities:      Appears normal  LVOT:                  Appears normal  Other:  Technically difficult due to maternal habitus and fetal position. ---------------------------------------------------------------------- Cervix Uterus Adnexa  Cervix  Not visualized (advanced GA >24wks)  Uterus  No abnormality visualized.  Left Ovary  Not  visualized.  Right Ovary  Not visualized.  Cul De Sac  No free fluid seen.  Adnexa  No abnormality visualized.  ---------------------------------------------------------------------- Impression  On ultrasound, fetal growth is appropriate for gestational age.  Amniotic fluid is normal and good fetal activity is seen.  Cephalic presentation. Fetal anatomy appears normal, but  limited by advanced gestational age. Antenatal testing is  reassuring. BPP 8/8.  Placenta is anterior and there is no evidence of previa or  accreta.  See consultation note in EPIC. ---------------------------------------------------------------------- Recommendations  -Twice-weekly BPP (Mon-Thu). ----------------------------------------------------------------------                  Noralee Space, MD Electronically Signed Final Report   07/08/2018 05:49 pm ----------------------------------------------------------------------   IMP: severe preeclampsia s/p magnesium sulfate, BMZ complete On labetalol, hydralazine Previous C/S IUP @ 28 5/7 weeks  P) cont inpt mgmt. Reviewed plan of care with pt. Agree with trying to buy more time. Repeat BPP Thursday. Cont BP meds

## 2018-07-14 ENCOUNTER — Inpatient Hospital Stay (HOSPITAL_COMMUNITY): Payer: BLUE CROSS/BLUE SHIELD

## 2018-07-14 ENCOUNTER — Inpatient Hospital Stay (HOSPITAL_COMMUNITY): Payer: BLUE CROSS/BLUE SHIELD | Admitting: Anesthesiology

## 2018-07-14 ENCOUNTER — Encounter (HOSPITAL_COMMUNITY)
Admission: AD | Disposition: A | Discharge status: Home / Self Care | Payer: Self-pay | Source: Home / Self Care | Attending: Obstetrics and Gynecology

## 2018-07-14 ENCOUNTER — Encounter (HOSPITAL_COMMUNITY): Payer: Self-pay

## 2018-07-14 DIAGNOSIS — O99892 Other specified diseases and conditions complicating childbirth: Secondary | ICD-10-CM | POA: Diagnosis not present

## 2018-07-14 LAB — COMPREHENSIVE METABOLIC PANEL
ALT: 58 U/L — ABNORMAL HIGH (ref 0–44)
AST: 88 U/L — ABNORMAL HIGH (ref 15–41)
Albumin: 2.3 g/dL — ABNORMAL LOW (ref 3.5–5.0)
Alkaline Phosphatase: 69 U/L (ref 38–126)
Anion gap: 8 (ref 5–15)
BUN: 9 mg/dL (ref 6–20)
CO2: 25 mmol/L (ref 22–32)
Calcium: 8.6 mg/dL — ABNORMAL LOW (ref 8.9–10.3)
Chloride: 104 mmol/L (ref 98–111)
Creatinine, Ser: 0.91 mg/dL (ref 0.44–1.00)
GFR calc Af Amer: >60 mL/min (ref >60)
GFR calc non Af Amer: >60 mL/min (ref >60)
Glucose, Bld: 71 mg/dL (ref 70–99)
Potassium: 3.7 mmol/L (ref 3.5–5.1)
Sodium: 137 mmol/L (ref 135–145)
Total Bilirubin: 0.4 mg/dL (ref 0.3–1.2)
Total Protein: 5.3 g/dL — ABNORMAL LOW (ref 6.5–8.1)

## 2018-07-14 LAB — CBC
HCT: 34.4 % — ABNORMAL LOW (ref 36.0–46.0)
Hemoglobin: 11.3 g/dL — ABNORMAL LOW (ref 12.0–15.0)
MCH: 27.2 pg (ref 26.0–34.0)
MCHC: 32.8 g/dL (ref 30.0–36.0)
MCV: 82.9 fL (ref 80.0–100.0)
Platelets: 218 10*3/uL (ref 150–400)
RBC: 4.15 MIL/uL (ref 3.87–5.11)
RDW: 14.9 % (ref 11.5–15.5)
WBC: 13.6 10*3/uL — ABNORMAL HIGH (ref 4.0–10.5)
nRBC: 0.3 % — ABNORMAL HIGH (ref 0.0–0.2)

## 2018-07-14 LAB — PREPARE RBC (CROSSMATCH)

## 2018-07-14 SURGERY — Surgical Case
Anesthesia: General

## 2018-07-14 MED ORDER — LACTATED RINGERS IV SOLN
INTRAVENOUS | Status: DC | PRN
  Administered 2018-07-14: 09:00:00 via INTRAVENOUS

## 2018-07-14 MED ORDER — COCONUT OIL OIL: 1.0000 "application " | TOPICAL_OIL | Status: DC | PRN

## 2018-07-14 MED ORDER — DEXAMETHASONE SODIUM PHOSPHATE 10 MG/ML IJ SOLN
INTRAMUSCULAR | Status: AC
  Filled 2018-07-14: qty 1

## 2018-07-14 MED ORDER — LABETALOL HCL 5 MG/ML IV SOLN
INTRAVENOUS | Status: AC
  Filled 2018-07-14: qty 4

## 2018-07-14 MED ORDER — SCOPOLAMINE 1 MG/3DAYS TD PT72
MEDICATED_PATCH | TRANSDERMAL | Status: AC
  Filled 2018-07-14: qty 1

## 2018-07-14 MED ORDER — HYDROMORPHONE HCL 1 MG/ML IJ SOLN
INTRAMUSCULAR | Status: DC | PRN
  Administered 2018-07-14 (×2): 1 mg via INTRAVENOUS

## 2018-07-14 MED ORDER — CARBOPROST TROMETHAMINE 250 MCG/ML IM SOLN
INTRAMUSCULAR | Status: DC | PRN
  Administered 2018-07-14: 250 ug via INTRAMUSCULAR

## 2018-07-14 MED ORDER — MIDAZOLAM HCL 2 MG/2ML IJ SOLN
INTRAMUSCULAR | Status: DC | PRN
  Administered 2018-07-14: 2 mg via INTRAVENOUS

## 2018-07-14 MED ORDER — FENTANYL CITRATE (PF) 250 MCG/5ML IJ SOLN
INTRAMUSCULAR | Status: AC
  Filled 2018-07-14: qty 5

## 2018-07-14 MED ORDER — OXYTOCIN 40 UNITS IN NORMAL SALINE INFUSION - SIMPLE MED
INTRAVENOUS | Status: AC
  Filled 2018-07-14: qty 1000

## 2018-07-14 MED ORDER — PROPOFOL 10 MG/ML IV BOLUS
INTRAVENOUS | Status: AC
  Filled 2018-07-14: qty 20

## 2018-07-14 MED ORDER — PHENYLEPHRINE HCL-NACL 20-0.9 MG/250ML-% IV SOLN
INTRAVENOUS | Status: AC
  Filled 2018-07-14: qty 250

## 2018-07-14 MED ORDER — ACETAMINOPHEN 10 MG/ML IV SOLN: 1000.0000 mg | Freq: Once | INTRAVENOUS | Status: DC | PRN

## 2018-07-14 MED ORDER — MEPERIDINE HCL 25 MG/ML IJ SOLN: 6.2500 mg | INTRAMUSCULAR | Status: DC | PRN

## 2018-07-14 MED ORDER — CARBOPROST TROMETHAMINE 250 MCG/ML IM SOLN
INTRAMUSCULAR | Status: AC
  Filled 2018-07-14: qty 1

## 2018-07-14 MED ORDER — MAGNESIUM SULFATE 40 G IN LACTATED RINGERS - SIMPLE
2.0000 g/h | INTRAVENOUS | Status: DC
  Administered 2018-07-14 (×2): 2 g/h via INTRAVENOUS

## 2018-07-14 MED ORDER — METOCLOPRAMIDE HCL 5 MG/ML IJ SOLN
INTRAMUSCULAR | Status: DC | PRN
  Administered 2018-07-14: 10 mg via INTRAVENOUS

## 2018-07-14 MED ORDER — HYDROMORPHONE HCL 1 MG/ML IJ SOLN
INTRAMUSCULAR | Status: AC
  Filled 2018-07-14: qty 1

## 2018-07-14 MED ORDER — SIMETHICONE 80 MG PO CHEW: 80.0000 mg | CHEWABLE_TABLET | ORAL | Status: DC | PRN

## 2018-07-14 MED ORDER — SENNOSIDES-DOCUSATE SODIUM 8.6-50 MG PO TABS
2.0000 | ORAL_TABLET | ORAL | Status: DC
  Administered 2018-07-14 – 2018-07-16 (×3): 2 via ORAL
  Filled 2018-07-14 (×3): qty 2

## 2018-07-14 MED ORDER — MENTHOL 3 MG MT LOZG: 1.0000 | LOZENGE | OROMUCOSAL | Status: DC | PRN

## 2018-07-14 MED ORDER — TRANEXAMIC ACID-NACL 1000-0.7 MG/100ML-% IV SOLN
INTRAVENOUS | Status: AC
  Filled 2018-07-14: qty 100

## 2018-07-14 MED ORDER — SIMETHICONE 80 MG PO CHEW
80.0000 mg | CHEWABLE_TABLET | Freq: Three times a day (TID) | ORAL | Status: DC
  Administered 2018-07-14 – 2018-07-16 (×8): 80 mg via ORAL
  Filled 2018-07-14 (×8): qty 1

## 2018-07-14 MED ORDER — ONDANSETRON HCL 4 MG/2ML IJ SOLN
INTRAMUSCULAR | Status: AC
  Filled 2018-07-14: qty 2

## 2018-07-14 MED ORDER — SIMETHICONE 80 MG PO CHEW
80.0000 mg | CHEWABLE_TABLET | ORAL | Status: DC
  Administered 2018-07-14 – 2018-07-16 (×3): 80 mg via ORAL
  Filled 2018-07-14 (×3): qty 1

## 2018-07-14 MED ORDER — DEXAMETHASONE SODIUM PHOSPHATE 4 MG/ML IJ SOLN
INTRAMUSCULAR | Status: DC | PRN
  Administered 2018-07-14: 4 mg via INTRAVENOUS

## 2018-07-14 MED ORDER — LIDOCAINE 2% (20 MG/ML) 5 ML SYRINGE
INTRAMUSCULAR | Status: AC
  Filled 2018-07-14: qty 5

## 2018-07-14 MED ORDER — HYDROCODONE-ACETAMINOPHEN 7.5-325 MG PO TABS: 1.0000 | ORAL_TABLET | Freq: Once | ORAL | Status: DC | PRN

## 2018-07-14 MED ORDER — SUCCINYLCHOLINE CHLORIDE 20 MG/ML IJ SOLN
INTRAMUSCULAR | Status: DC | PRN
  Administered 2018-07-14: 120 mg via INTRAVENOUS

## 2018-07-14 MED ORDER — PROPOFOL 10 MG/ML IV BOLUS
INTRAVENOUS | Status: DC | PRN
  Administered 2018-07-14: 200 mg via INTRAVENOUS
  Administered 2018-07-14 (×2): 30 mg via INTRAVENOUS

## 2018-07-14 MED ORDER — MAGNESIUM SULFATE BOLUS VIA INFUSION
4.0000 g | Freq: Once | INTRAVENOUS | Status: AC
  Administered 2018-07-14: 4 g via INTRAVENOUS
  Filled 2018-07-14: qty 500

## 2018-07-14 MED ORDER — ONDANSETRON HCL 4 MG/2ML IJ SOLN
INTRAMUSCULAR | Status: DC | PRN
  Administered 2018-07-14: 4 mg via INTRAVENOUS

## 2018-07-14 MED ORDER — LIDOCAINE HCL (CARDIAC) PF 100 MG/5ML IV SOSY
PREFILLED_SYRINGE | INTRAVENOUS | Status: DC | PRN
  Administered 2018-07-14: 200 mg via INTRAVENOUS

## 2018-07-14 MED ORDER — PROMETHAZINE HCL 25 MG/ML IJ SOLN: 6.2500 mg | INTRAMUSCULAR | Status: DC | PRN

## 2018-07-14 MED ORDER — STERILE WATER FOR INJECTION IV SOLN: INTRAVENOUS | Status: DC

## 2018-07-14 MED ORDER — TRANEXAMIC ACID 1000 MG/10ML IV SOLN
INTRAVENOUS | Status: DC | PRN
  Administered 2018-07-14: 1000 mg via INTRAVENOUS

## 2018-07-14 MED ORDER — HYDROMORPHONE HCL 1 MG/ML IJ SOLN: 0.2500 mg | INTRAMUSCULAR | Status: DC | PRN

## 2018-07-14 MED ORDER — WITCH HAZEL-GLYCERIN EX PADS: 1.0000 "application " | MEDICATED_PAD | CUTANEOUS | Status: DC | PRN

## 2018-07-14 MED ORDER — SODIUM CHLORIDE 0.9% IV SOLUTION: Freq: Once | INTRAVENOUS | Status: DC

## 2018-07-14 MED ORDER — IBUPROFEN 800 MG PO TABS
800.0000 mg | ORAL_TABLET | Freq: Three times a day (TID) | ORAL | Status: AC
  Administered 2018-07-14 – 2018-07-17 (×9): 800 mg via ORAL
  Filled 2018-07-14 (×9): qty 1

## 2018-07-14 MED ORDER — PRENATAL MULTIVITAMIN CH
1.0000 | ORAL_TABLET | Freq: Every day | ORAL | Status: DC
  Administered 2018-07-15 – 2018-07-17 (×3): 1 via ORAL
  Filled 2018-07-14 (×3): qty 1

## 2018-07-14 MED ORDER — PHENYLEPHRINE 40 MCG/ML (10ML) SYRINGE FOR IV PUSH (FOR BLOOD PRESSURE SUPPORT)
PREFILLED_SYRINGE | INTRAVENOUS | Status: AC
  Filled 2018-07-14: qty 10

## 2018-07-14 MED ORDER — SODIUM CHLORIDE 0.9 % IR SOLN
Status: DC | PRN
  Administered 2018-07-14: 1

## 2018-07-14 MED ORDER — MIDAZOLAM HCL 2 MG/2ML IJ SOLN
INTRAMUSCULAR | Status: AC
  Filled 2018-07-14: qty 2

## 2018-07-14 MED ORDER — DEXTROSE 5 % IV SOLN
INTRAVENOUS | Status: AC
  Filled 2018-07-14: qty 3000

## 2018-07-14 MED ORDER — OXYTOCIN 10 UNIT/ML IJ SOLN
INTRAMUSCULAR | Status: AC
  Filled 2018-07-14: qty 4

## 2018-07-14 MED ORDER — ZOLPIDEM TARTRATE 5 MG PO TABS: 5.0000 mg | ORAL_TABLET | Freq: Every evening | ORAL | Status: DC | PRN

## 2018-07-14 MED ORDER — DEXTROSE 5 % IV SOLN
INTRAVENOUS | Status: DC | PRN
  Administered 2018-07-14: 3 g via INTRAVENOUS

## 2018-07-14 MED ORDER — DIPHENHYDRAMINE HCL 25 MG PO CAPS: 25.0000 mg | ORAL_CAPSULE | Freq: Four times a day (QID) | ORAL | Status: DC | PRN

## 2018-07-14 MED ORDER — OXYCODONE HCL 5 MG PO TABS: 5.0000 mg | ORAL_TABLET | ORAL | Status: DC | PRN

## 2018-07-14 MED ORDER — SUCCINYLCHOLINE CHLORIDE 200 MG/10ML IV SOSY
PREFILLED_SYRINGE | INTRAVENOUS | Status: AC
  Filled 2018-07-14: qty 10

## 2018-07-14 MED ORDER — OXYTOCIN 40 UNITS IN NORMAL SALINE INFUSION - SIMPLE MED: 2.5000 [IU]/h | INTRAVENOUS | Status: DC

## 2018-07-14 MED ORDER — METOCLOPRAMIDE HCL 5 MG/ML IJ SOLN
INTRAMUSCULAR | Status: AC
  Filled 2018-07-14: qty 2

## 2018-07-14 MED ORDER — FENTANYL CITRATE (PF) 100 MCG/2ML IJ SOLN
INTRAMUSCULAR | Status: AC
  Filled 2018-07-14: qty 2

## 2018-07-14 MED ORDER — LACTATED RINGERS IV SOLN
INTRAVENOUS | Status: DC
  Administered 2018-07-14 – 2018-07-15 (×3): via INTRAVENOUS

## 2018-07-14 MED ORDER — OXYTOCIN 10 UNIT/ML IJ SOLN
INTRAVENOUS | Status: DC | PRN
  Administered 2018-07-14: 40 [IU] via INTRAVENOUS

## 2018-07-14 MED ORDER — DIBUCAINE 1 % RE OINT: 1.0000 "application " | TOPICAL_OINTMENT | RECTAL | Status: DC | PRN

## 2018-07-14 MED ORDER — FENTANYL CITRATE (PF) 100 MCG/2ML IJ SOLN
INTRAMUSCULAR | Status: DC | PRN
  Administered 2018-07-14: 100 ug via INTRAVENOUS
  Administered 2018-07-14: 250 ug via INTRAVENOUS

## 2018-07-14 SURGICAL SUPPLY — 44 items
APL SKNCLS STERI-STRIP NONHPOA (GAUZE/BANDAGES/DRESSINGS)
BARRIER ADHS 3X4 INTERCEED (GAUZE/BANDAGES/DRESSINGS) ×2 IMPLANT
BENZOIN TINCTURE PRP APPL 2/3 (GAUZE/BANDAGES/DRESSINGS) IMPLANT
BRR ADH 4X3 ABS CNTRL BYND (GAUZE/BANDAGES/DRESSINGS) ×1
CHLORAPREP W/TINT 26ML (MISCELLANEOUS) ×2 IMPLANT
CLAMP CORD UMBIL (MISCELLANEOUS) IMPLANT
CLOTH BEACON ORANGE TIMEOUT ST (SAFETY) ×2 IMPLANT
DRAPE C SECTION CLR SCREEN (DRAPES) ×2 IMPLANT
DRSG OPSITE POSTOP 4X10 (GAUZE/BANDAGES/DRESSINGS) ×2 IMPLANT
ELECT REM PT RETURN 9FT ADLT (ELECTROSURGICAL) ×2
ELECTRODE REM PT RTRN 9FT ADLT (ELECTROSURGICAL) ×1 IMPLANT
EXTRACTOR VACUUM M CUP 4 TUBE (SUCTIONS) IMPLANT
GLOVE BIOGEL PI IND STRL 7.0 (GLOVE) ×2 IMPLANT
GLOVE BIOGEL PI INDICATOR 7.0 (GLOVE) ×2
GLOVE ECLIPSE 6.5 STRL STRAW (GLOVE) ×2 IMPLANT
GOWN STRL REUS W/TWL LRG LVL3 (GOWN DISPOSABLE) ×4 IMPLANT
KIT ABG SYR 3ML LUER SLIP (SYRINGE) IMPLANT
NDL HYPO 25X5/8 SAFETYGLIDE (NEEDLE) IMPLANT
NEEDLE HYPO 22GX1.5 SAFETY (NEEDLE) ×2 IMPLANT
NEEDLE HYPO 25X5/8 SAFETYGLIDE (NEEDLE) IMPLANT
NS IRRIG 1000ML POUR BTL (IV SOLUTION) ×2 IMPLANT
PACK C SECTION WH (CUSTOM PROCEDURE TRAY) ×2 IMPLANT
PAD OB MATERNITY 4.3X12.25 (PERSONAL CARE ITEMS) ×2 IMPLANT
RTRCTR C-SECT PINK 25CM LRG (MISCELLANEOUS) IMPLANT
STRIP CLOSURE SKIN 1/2X4 (GAUZE/BANDAGES/DRESSINGS) IMPLANT
SUT CHROMIC GUT AB #0 18 (SUTURE) IMPLANT
SUT MNCRL 0 VIOLET CTX 36 (SUTURE) ×3 IMPLANT
SUT MON AB 2-0 SH 27 (SUTURE)
SUT MON AB 2-0 SH27 (SUTURE) IMPLANT
SUT MON AB 3-0 SH 27 (SUTURE)
SUT MON AB 3-0 SH27 (SUTURE) IMPLANT
SUT MON AB 4-0 PS1 27 (SUTURE) IMPLANT
SUT MONOCRYL 0 CTX 36 (SUTURE) ×3
SUT PLAIN 2 0 (SUTURE)
SUT PLAIN 2 0 XLH (SUTURE) IMPLANT
SUT PLAIN ABS 2-0 CT1 27XMFL (SUTURE) IMPLANT
SUT VIC AB 0 CT1 36 (SUTURE) ×4 IMPLANT
SUT VIC AB 2-0 CT1 27 (SUTURE) ×2
SUT VIC AB 2-0 CT1 TAPERPNT 27 (SUTURE) ×1 IMPLANT
SUT VIC AB 4-0 PS2 27 (SUTURE) IMPLANT
SYR CONTROL 10ML LL (SYRINGE) ×2 IMPLANT
TOWEL OR 17X24 6PK STRL BLUE (TOWEL DISPOSABLE) ×2 IMPLANT
TRAY FOLEY W/BAG SLVR 14FR LF (SET/KITS/TRAYS/PACK) IMPLANT
WATER STERILE IRR 1000ML POUR (IV SOLUTION) ×2 IMPLANT

## 2018-07-14 NOTE — Transfer of Care (Signed)
Immediate Anesthesia Transfer of Care Note  Patient: Chloe Graves  Procedure(s) Performed: CESAREAN SECTION (N/A )  Patient Location: PACU  Anesthesia Type:General  Level of Consciousness: awake, alert  and oriented  Airway & Oxygen Therapy: Patient Spontanous Breathing and Patient connected to nasal cannula oxygen  Post-op Assessment: Report given to RN and Post -op Vital signs reviewed and stable  Post vital signs: Reviewed and stable  Last Vitals:  Vitals Value Taken Time  BP 132/92 07/14/2018 10:15 AM  Temp    Pulse 86 07/14/2018 10:20 AM  Resp 20 07/14/2018 10:20 AM  SpO2 100 % 07/14/2018 10:20 AM  Vitals shown include unvalidated device data.  Last Pain:  Vitals:   07/14/18 0815  TempSrc:   PainSc: 10-Worst pain ever      Patients Stated Pain Goal: 4 (07/14/18 0815)  Complications: No apparent anesthesia complications

## 2018-07-14 NOTE — Progress Notes (Signed)
Late entry  Called  By RN at 8:19 am regarding pt c/o right abdominal pain with pt unable to lay still and be placed on monitor. Stat CBC, CMP ordered as was bedside sono of liver  in the interim OR was called en route to hospital. On arrival, abd sonogram was in progress. BP 180/112 Pt was given her labetalol. HL in place. Order to start IVF. Also to give magnesium bolus.  Baby on monitor. Per sonographer no problem noted with liver. Given BP and RUQ pain, stat C/S called. Pt notified and agree with plan CMP/CBC pending. To OR

## 2018-07-14 NOTE — Anesthesia Preprocedure Evaluation (Addendum)
Anesthesia Evaluation  Patient identified by MRN, date of birth, ID band Patient awake  Preop documentation limited or incomplete due to emergent nature of procedure.  Airway Mallampati: III  TM Distance: >3 FB Neck ROM: Full    Dental no notable dental hx. (+) Teeth Intact, Dental Advisory Given   Pulmonary Current Smoker,    Pulmonary exam normal breath sounds clear to auscultation       Cardiovascular hypertension, Pt. on home beta blockers and Pt. on medications Normal cardiovascular exam Rhythm:Regular Rate:Normal     Neuro/Psych    GI/Hepatic   Endo/Other  Morbid obesity  Renal/GU      Musculoskeletal   Abdominal   Peds  Hematology  (+) anemia ,   Anesthesia Other Findings   Reproductive/Obstetrics                            Lab Results  Component Value Date   WBC 11.8 (H) 07/12/2018   HGB 10.8 (L) 07/12/2018   HCT 32.9 (L) 07/12/2018   MCV 83.1 07/12/2018   PLT 260 07/12/2018    Anesthesia Physical Anesthesia Plan  ASA: III and emergent  Anesthesia Plan: General   Post-op Pain Management:    Induction: Intravenous, Cricoid pressure planned and Rapid sequence  PONV Risk Score and Plan:   Airway Management Planned: Video Laryngoscope Planned and Oral ETT  Additional Equipment:   Intra-op Plan:   Post-operative Plan: Extubation in OR  Informed Consent: I have reviewed the patients History and Physical, chart, labs and discussed the procedure including the risks, benefits and alternatives for the proposed anesthesia with the patient or authorized representative who has indicated his/her understanding and acceptance.     Dental advisory given  Plan Discussed with:   Anesthesia Plan Comments: (Code Cesarian for Possible abruption, Anemia HTN on Magnesium)       Anesthesia Quick Evaluation

## 2018-07-14 NOTE — Brief Op Note (Signed)
07/08/2018 - 07/14/2018  9:37 AM  PATIENT:  Chloe Graves  28 y.o. female  PRE-OPERATIVE DIAGNOSIS:  RUQ pain, IUP @ 28 6/7 weeks, previous C/S, worsening severe preeclampsia  POST-OPERATIVE DIAGNOSIS: same  PROCEDURE:  Emergency repeat cesarean section, kerr hysterotomy  SURGEON:  Surgeon(s) and Role:    * Maxie Better, MD - Primary  PHYSICIAN ASSISTANT:   ASSISTANTS: Arlan Organ, CNM   ANESTHESIA:   general Findings: live female, nl tubes and ovaries, cord around left foot, Apgar7/8.  2lb 1 oz EBL:  482 mL   BLOOD ADMINISTERED:none  DRAINS: none   LOCAL MEDICATIONS USED:  NONE  SPECIMEN:  Source of Specimen:  placenta, cord ph  DISPOSITION OF SPECIMEN:  PATHOLOGY  COUNTS:  YES  TOURNIQUET:  * No tourniquets in log *  DICTATION: .Other Dictation: Dictation Number [id number]  PLAN OF CARE: Admit to inpatient   PATIENT DISPOSITION:  PACU - hemodynamically stable.   Delay start of Pharmacological VTE agent (>24hrs) due to surgical blood loss or risk of bleeding: no

## 2018-07-14 NOTE — Anesthesia Postprocedure Evaluation (Signed)
Anesthesia Post Note  Patient: Chloe Graves  Procedure(s) Performed: CESAREAN SECTION (N/A )     Patient location during evaluation: PACU Anesthesia Type: General Level of consciousness: awake and alert Pain management: pain level controlled Vital Signs Assessment: post-procedure vital signs reviewed and stable Respiratory status: spontaneous breathing, nonlabored ventilation, respiratory function stable and patient connected to nasal cannula oxygen Cardiovascular status: blood pressure returned to baseline and stable Postop Assessment: no apparent nausea or vomiting Anesthetic complications: no    Last Vitals:  Vitals:   07/14/18 1115 07/14/18 1154  BP: 138/82   Pulse: 79   Resp: 15 16  Temp: 36.5 C (!) 36.4 C  SpO2: 99%     Last Pain:  Vitals:   07/14/18 1306  TempSrc:   PainSc: Asleep   Pain Goal: Patients Stated Pain Goal: 3 (07/14/18 1208)                 Trevor Iha

## 2018-07-14 NOTE — Anesthesia Procedure Notes (Signed)
Procedure Name: Intubation Date/Time: 07/14/2018 8:45 AM Performed by: Flossie Dibble, CRNA Pre-anesthesia Checklist: Patient identified, Patient being monitored, Timeout performed, Emergency Drugs available and Suction available Patient Re-evaluated:Patient Re-evaluated prior to induction Oxygen Delivery Method: Circle System Utilized Preoxygenation: Pre-oxygenation with 100% oxygen Induction Type: IV induction Ventilation: Mask ventilation without difficulty Laryngoscope Size: Mac, 3 and Glidescope Grade View: Grade I Tube type: Oral Tube size: 7.0 mm Number of attempts: 1 Airway Equipment and Method: stylet and Video-laryngoscopy Placement Confirmation: ETT inserted through vocal cords under direct vision,  positive ETCO2 and breath sounds checked- equal and bilateral Secured at: 21 cm Tube secured with: Tape Dental Injury: Teeth and Oropharynx as per pre-operative assessment

## 2018-07-14 NOTE — Consult Note (Signed)
[facility]  Delivery Note:  C-section       07/14/2018  9:25 AM  I was called to the operating room at the request of the patient's obstetrician (Dr. Cherly Hensen) for a stat repeat c-section.  PRENATAL HX:  This is a 28 y/o G3P0111 at 2 and 6/[redacted] weeks gestation who was admitted on 2/20 for pre-eclampsia.  OB history significant for prior delivery of 27 week infant for worsening preeclampsia.  She received BMZ x2 and magnesium sulfate.  She received labetalol and hydralazine for elevated blood pressures while hospitalized, however she continue to have severe rage blood pressures so was taken today for emergency c-section.    DELIVERY:  Infant was vigorous at delivery, initially requiring no resuscitation other than standard warming, drying and stimulation.  She was placed on warming mattress inside polyethylene bag and pulse oximeter applied.  CPAP +5, 30% applied at ~1 minute of age given gestational age, and FiO2 increased initially to maximum of 80% to achieve goal minute saturations, but was weaned to 30% by 10 minutes.  She did require PPV x30 seconds at ~ 2 minutes of age for apnea and brief bradycardia, but she had no further apnea after that.  APGARs 7 and 8.   Admitted to NICU on CPAP +5, 30%.    _____________________ Electronically Signed By: Maryan Char, MD Neonatologist

## 2018-07-14 NOTE — Progress Notes (Signed)
S: At [facility] for eval of pain and severe range BP, G3P0111 at [redacted]w[redacted]d, severe PEC, S/P BMZ x 2, on labetalol and hydralazine, s/p Mag Sulfate.   Patient c/o pain 10/10, started during night, worsening this AM, localized in RUQ and radiating to back, some nausea, no HA, no visual changes.   O:  Vitals:   07/14/18 0800 07/14/18 0802 07/14/18 0821 07/14/18 0831  BP:  (!) 163/113 (!) 196/113 (!) 182/105  Pulse:   79 73  Resp: 20     Temp: 98.1 F (36.7 C)     TempSrc: Oral     SpO2:      Weight:      Height:        FHR 150, mod var, small accels, no decels No ctx  Gen: AAO x 3, appears uncomfortable Abd: no rebound tenderness, no guarding, soft, gravid. GU deferred Ext, trace edema Neuro: no clonus, DTR's +2  A/P: G3P0111 at [redacted]w[redacted]d, severe PEC, S/P BMZ x 2, on labetalol and hydralazine, s/p Mag Sulfate.  Severe range BP - IV antihypertensive protocol initiated - HELLP labs STAT pending - abdominal sono in progress  OB on call notified of findings, Dr. Cherly Hensen en route.  Neta Mends, MSN, CNM 07/14/2018, 8:44 AM

## 2018-07-15 ENCOUNTER — Encounter (HOSPITAL_COMMUNITY): Payer: Self-pay | Admitting: Obstetrics and Gynecology

## 2018-07-15 LAB — COMPREHENSIVE METABOLIC PANEL
ALT: 87 U/L — ABNORMAL HIGH (ref 0–44)
AST: 105 U/L — ABNORMAL HIGH (ref 15–41)
Albumin: 2.1 g/dL — ABNORMAL LOW (ref 3.5–5.0)
Alkaline Phosphatase: 61 U/L (ref 38–126)
Anion gap: 9 (ref 5–15)
BUN: 10 mg/dL (ref 6–20)
CO2: 23 mmol/L (ref 22–32)
Calcium: 7.5 mg/dL — ABNORMAL LOW (ref 8.9–10.3)
Chloride: 102 mmol/L (ref 98–111)
Creatinine, Ser: 0.9 mg/dL (ref 0.44–1.00)
GFR calc Af Amer: >60 mL/min (ref >60)
GFR calc non Af Amer: >60 mL/min (ref >60)
Glucose, Bld: 112 mg/dL — ABNORMAL HIGH (ref 70–99)
Potassium: 4.9 mmol/L (ref 3.5–5.1)
Sodium: 134 mmol/L — ABNORMAL LOW (ref 135–145)
Total Bilirubin: 0.4 mg/dL (ref 0.3–1.2)
Total Protein: 5 g/dL — ABNORMAL LOW (ref 6.5–8.1)

## 2018-07-15 LAB — CBC
HCT: 29.3 % — ABNORMAL LOW (ref 36.0–46.0)
Hemoglobin: 9.9 g/dL — ABNORMAL LOW (ref 12.0–15.0)
MCH: 27.3 pg (ref 26.0–34.0)
MCHC: 33.8 g/dL (ref 30.0–36.0)
MCV: 80.9 fL (ref 80.0–100.0)
Platelets: 176 10*3/uL (ref 150–400)
RBC: 3.62 MIL/uL — ABNORMAL LOW (ref 3.87–5.11)
RDW: 14.7 % (ref 11.5–15.5)
WBC: 18.2 10*3/uL — ABNORMAL HIGH (ref 4.0–10.5)
nRBC: 0.3 % — ABNORMAL HIGH (ref 0.0–0.2)

## 2018-07-15 MED ORDER — ACETAMINOPHEN 500 MG PO TABS: 1000.0000 mg | ORAL_TABLET | Freq: Four times a day (QID) | ORAL | Status: DC | PRN

## 2018-07-15 NOTE — Lactation Note (Signed)
This note was copied from a baby's chart. Lactation Consultation Note  Patient Name: Chloe Graves GYKZL'D Date: 07/15/2018 Reason for consult: Initial assessment;Infant < 6lbs;Preterm <34wks;NICU baby  LC had to come back to the room twice because mom was in the bathroom, and later on she was ordering her lunch. Visited with mom of 67 hours old pre-term NICU female who is still on NPO but will start feedings on breastmilk soon. Mom is a P2 but not very experienced BF, her first child was born at 41 weeks and she only pumped for 4 weeks, then quit doing it. Mom chooses to do donor milk from NICU but she voiced to [facility] that her sister and her friend from [place] have offered both to donate breastmilk for her baby. Informed mom about breastmilk sharing policy and asked her to check with her RN. Baby will be on donor milk in the mean time. She's already familiar with hand expression and voiced to [facility] that she has been able to see droplets of colostrum, like "moisture" when she hands express or pumps. Mom has a DEBP at home.  She was set up with a DEBP by her RN but it's already discouraged because she didn't get "anything". Explained to mom that pumping at this point is mainly for breast stimulation and not to get volume. She voiced understanding. Mom was very engaged during [facility] consultation and had lots of questions. Discussed benefits of premature milk Vs. Full term milk, DEBP instructions and storage as well as milk storage guidelines. Mom voiced she's going to try harder to get some milk for her baby. Praised her for efforts but also let her know that her sleep is important and as long as she doesn't go more than 6 hours without pumping at night, her supply should be OK.  Feeding plan:  1. Encouraged mom to pump every 3 hours and at least once at night 2. Mom aware of labeling and storing her milk, will start turning it in once she get volume/drops  BF brochure, BF resources and pumping log were  reviewed. Mom reported all questions and concerns were answered, she's aware of LC services and will call PRN.  Maternal Data Formula Feeding for Exclusion: No Has patient been taught Hand Expression?: Yes Does the patient have breastfeeding experience prior to this delivery?: Yes  Feeding    LATCH Score                   Interventions Interventions: Breast feeding basics reviewed;DEBP  Lactation Tools Discussed/Used Tools: Pump Breast pump type: Double-Electric Breast Pump WIC Program: No Pump Review: Setup, frequency, and cleaning;Milk Storage Initiated by:: RN Date initiated:: 07/15/18   Consult Status Consult Status: PRN Follow-up type: In-patient    Chloe Graves 07/15/2018, 2:44 PM

## 2018-07-15 NOTE — Progress Notes (Addendum)
POSTOPERATIVE DAY # 1 S/P Repeat LTCS for Severe pre-eclampsia, baby girl in NICU   S:         Reports feeling much better; able to rest overnight   Denies HA, visual changes, RUQ pain has resolved              Tolerating po intake / no nausea / no vomiting / no flatus / no BM  Denies dizziness, SOB, or CP             Bleeding is light             Pain controlled with Motrin             Up ad lib / ambulatory/ void x 1 since foley catheter removal   Newborn in NICU and stable - ready to go see her; mom is pumping; no colostrum yet    O:  VS: BP 132/84 (BP Location: Left Arm)   Pulse 72   Temp 98.5 F (36.9 C) (Oral)   Resp 18   Ht 5\' 2"  (1.575 m)   Wt 118.4 kg Comment: standing scale  LMP 12/24/2017 (Exact Date)   SpO2 100%   Breastfeeding Unknown   BMI 47.74 kg/m  07/15/18 0700  -  -  -  18  -  -  -  -  - ML   07/15/18 0539  98.1 F (36.7 C)  72  -  19  132/84  Semi-fowlers  100 %  Room Air  - ML   07/15/18 0500  -  -  -  18  -  -  -  -  - ML   07/15/18 0400  -  -  -  17  -  -  -  -  - ML   07/15/18 0300  -  -  -  18  -  -  -  -  - ML   07/15/18 0200  -  -  -  18  -  -  -  -  - ML   07/15/18 0100  -  -  -  18  -  -  -  -  - ML   07/15/18 0000  -  -  -  17  -  -  100 %  Room Air  - ML   07/14/18 2300  -  63  -  17  105/59Abnormal   -  98 %  Room Air  - ML   07/14/18 2226  -  69  -  18  103/52Abnormal   High-fowlers  100 %  Room Air  - ML   07/14/18 2130  -  -  -  17  -  -  -  -  - ML   07/14/18 2100  -  87  -  18  100/68  High-fowlers  100 %  Room Air  - ML   07/14/18 2039  -  -  -  18  -  -  100 %  Room Air  - ML   07/14/18 1940  -  -  -  -  -  -  -  Room Air  - ML   07/14/18 1940  97.6 F (36.4 C)  83  -  17  109/64  Semi-fowlers  100 %  -  - AW   07/14/18 1900  -  74  -  -  109/64  -  99 %  -  -  CG   07/14/18 1800  -  75  -  -  106/88  -  -  -  - CG   07/14/18 1700  -  74  -  -  135/76  -  100 %  -  - CG   07/14/18 1500  -  59Abnormal   -  -  139/74  -  100 %  -  - CG    07/14/18 1400  -  66  -  -  121/74  -  99 %  -  - CG   07/14/18 1319  -  64  -  16  141/84Abnormal   -  100 %  -  - CG   07/14/18 1207  -  79  -  -  129/78  -  -  -  - CG   07/14/18 1154  97.5 F (36.4 C)Abnormal   73  -  16  120/107Abnormal   -              LABS:               Results for orders placed or performed during the hospital encounter of 07/08/18 (from the past 24 hour(s))  Prepare RBC     Status: None   Collection Time: 07/14/18  8:53 AM  Result Value Ref Range   Order Confirmation      ORDER PROCESSED BY BLOOD BANK Performed at [facility], [address], [place], [place] [address]   CBC     Status: Abnormal   Collection Time: 07/15/18  5:30 AM  Result Value Ref Range   WBC 18.2 (H) 4.0 - 10.5 K/uL   RBC 3.62 (L) 3.87 - 5.11 MIL/uL   Hemoglobin 9.9 (L) 12.0 - 15.0 g/dL   HCT 01.0 (L) 27.2 - 53.6 %   MCV 80.9 80.0 - 100.0 fL   MCH 27.3 26.0 - 34.0 pg   MCHC 33.8 30.0 - 36.0 g/dL   RDW 64.4 03.4 - 74.2 %   Platelets 176 150 - 400 K/uL   nRBC 0.3 (H) 0.0 - 0.2 %  Comprehensive metabolic panel     Status: Abnormal   Collection Time: 07/15/18  5:30 AM  Result Value Ref Range   Sodium 134 (L) 135 - 145 mmol/L   Potassium 4.9 3.5 - 5.1 mmol/L   Chloride 102 98 - 111 mmol/L   CO2 23 22 - 32 mmol/L   Glucose, Bld 112 (H) 70 - 99 mg/dL   BUN 10 6 - 20 mg/dL   Creatinine, Ser 5.95 0.44 - 1.00 mg/dL   Calcium 7.5 (L) 8.9 - 10.3 mg/dL   Total Protein 5.0 (L) 6.5 - 8.1 g/dL   Albumin 2.1 (L) 3.5 - 5.0 g/dL   AST 638 (H) 15 - 41 U/L   ALT 87 (H) 0 - 44 U/L   Alkaline Phosphatase 61 38 - 126 U/L   Total Bilirubin 0.4 0.3 - 1.2 mg/dL   GFR calc non Af Amer >60 >60 mL/min   GFR calc Af Amer >60 >60 mL/min   Anion gap 9 5 - 15               Bloodtype: --/--/A POS, A POS Performed at [facility], [address], [place], [place] [address]  [phone])  Rubella:  I&O: Intake/Output      02/26 0701 -  02/27 0700 02/27 0701 - 02/28 0700   P.O. 1160 360   I.V. (mL/kg) 3090.1 (26.1) 225 (1.9)   Total Intake(mL/kg) 4250.1 (35.9) 585 (4.9)   Urine (mL/kg/hr) 3050 (1.1) 125 (0.3)   Stool     Blood 482    Total Output 3532 125   Net +718.1 +460        Net negative: 2 L since admission              Physical Exam:             Alert and Oriented X3  Lungs: Clear and unlabored  Heart: regular rate and rhythm / no murmurs  Abdomen: soft, non-tender, non-distended, active bowel sounds in all quadrants             Fundus: firm, non-tender, U-2             Dressing:  Vacuum in place to suction; c/d/i             Incision:  approximated with sutures / unable to visualize due to wound vac  Perineum: intact  Lochia: small, no clots   Extremities: trace ankle and pedal edema, no calf pain or tenderness, +1 DTRS, no clonus bilaterally   A/P:    POD # 1 S/P Repeat LTCS            Severe pre-eclampsia  With elevated liver enzymes    - D/C Magnesium Sulfate now s/p 25hrs   - Current medication regimen: Hydralazing  PO q6hrs; Labetalol  BID   - BPs stable, a few low BPs, no neural s/s    - Repeat CBC/CMP in AM   - Net negative 2L since admit, but still net positive for last 24 hrs   - Continue Strict I&O   - Daily weights  ABL Anemia    - stable on Niferex  PO daily   Routine postoperative care              See lactation today   May shower today   Ambulation encouraged and warm liquids to promote bowel motility   Abdominal binder requested   Continue inpatient care   Carlean Jews, MSN, CNM [facility]

## 2018-07-15 NOTE — Op Note (Signed)
NAME: Chloe Graves, Chloe Graves MEDICAL RECORD OI:[id number] ACCOUNT [id number] DATE OF BIRTH:09/28/1990 FACILITY: [facility] LOCATION: [facility] PHYSICIAN:Evy Lutterman A. Jaydah Stahle, MD  OPERATIVE REPORT  DATE OF PROCEDURE:  07/14/2018  PREOPERATIVE DIAGNOSIS:  Right upper quadrant pain, severe preeclampsia, previous cesarean section, intrauterine gestation at 68 and 6/7 weeks.  PROCEDURE:  Emergency repeat cesarean section, Kerr hysterotomy.  POSTOPERATIVE DIAGNOSIS:  Right upper quadrant pain, severe preeclampsia, previous cesarean section, intrauterine gestation at 15 and 6/7 weeks.  ANESTHESIA:  General.  SURGEON:  Maxie Better, MD  ASSISTANTS:  Arlan Organ, CNM.  DESCRIPTION OF PROCEDURE:  Under adequate general anesthesia, the patient was quickly prepped.  Indwelling Foley catheter placed.  A Pfannenstiel skin incision made at the same time of the induction of the anesthesia, the incision was carried down quickly to the rectus fascia, which was opened transversely and then subsequently bluntly and sharply dissected off the rectus muscle in superior and inferior fashion.  Rectus muscle split in the midline.  The peritoneum was entered bluntly and extended.   A bladder retractor was then placed.  Incision was made across the lower uterine segment and extended bluntly in a cephalic and caudad manner.  Artificial rupture of membranes occurred.  Clear amniotic fluid.  Floating vertex encountered.  Subsequent delivery of a live female with the cord around the left foot.  The baby's cord was clamped, cut.  The baby was transferred to the awaiting pediatricians.  Due to prematurity, the baby was transferred to the intensive care unit.  Apgars were 7 and 8 at one and five minutes respectively. Cord pH was obtained.  Placenta was manually removed.  The uterus was atonic.  Uterine cavity was cleaned of debris.  IV Lysteda was given as well as intramuscular Hemabate.  The incision was closed in 2 layers, the  first layer 0 Monocryl running lock stitch, second layer imbricating using 0 Monocryl suture.  Normal tubes and ovaries were noted bilaterally.  Abdomen was irrigated, suctioned of debris.  Interceed was placed in the lower uterine segment in an inverted T fashion.  The parietal peritoneum was closed with 2-0 Vicryl.  The rectus fascia was closed with 0 Vicryl x2.  The subcutaneous area irrigated, small bleeders cauterized.  Interrupted 2-0 plain sutures placed and the skin approximated with 4-0 Vicryl subcuticular closure. Vacuum dressing was placed  SPECIMEN:  Placenta sent to pathology.  Cord pH was pending.  ESTIMATED BLOOD LOSS:  465.  URINE OUTPUT:  100.  INTRAOPERATIVE FLUIDS:  1300 mL  COUNTS:  Sponge and instrument counts x2 was correct.  COMPLICATIONS:  None.  The patient was transferred to the recovery room in stable condition.  Baby went to the neonatal intensive care unit.  TN/NUANCE  D:07/14/2018 T:07/15/2018 JOB:[id number]

## 2018-07-16 LAB — COMPREHENSIVE METABOLIC PANEL
ALT: 63 U/L — ABNORMAL HIGH (ref 0–44)
AST: 45 U/L — ABNORMAL HIGH (ref 15–41)
Albumin: 2.2 g/dL — ABNORMAL LOW (ref 3.5–5.0)
Alkaline Phosphatase: 59 U/L (ref 38–126)
Anion gap: 7 (ref 5–15)
BUN: 15 mg/dL (ref 6–20)
CO2: 24 mmol/L (ref 22–32)
Calcium: 8 mg/dL — ABNORMAL LOW (ref 8.9–10.3)
Chloride: 107 mmol/L (ref 98–111)
Creatinine, Ser: 0.94 mg/dL (ref 0.44–1.00)
GFR calc Af Amer: >60 mL/min (ref >60)
GFR calc non Af Amer: >60 mL/min (ref >60)
Glucose, Bld: 86 mg/dL (ref 70–99)
Potassium: 4.4 mmol/L (ref 3.5–5.1)
Sodium: 138 mmol/L (ref 135–145)
Total Bilirubin: 0.5 mg/dL (ref 0.3–1.2)
Total Protein: 5.4 g/dL — ABNORMAL LOW (ref 6.5–8.1)

## 2018-07-16 LAB — TYPE AND SCREEN
ABO/RH(D): A POS
Antibody Screen: NEGATIVE
Unit division: 0
Unit division: 0
Unit division: 0
Unit division: 0

## 2018-07-16 LAB — BPAM RBC
Blood Product Expiration Date: 202003222359
Blood Product Expiration Date: 202003222359
Blood Product Expiration Date: 202003222359
Blood Product Expiration Date: 202003222359
Unit Type and Rh: 6200
Unit Type and Rh: 6200
Unit Type and Rh: 6200
Unit Type and Rh: 6200

## 2018-07-16 LAB — CBC
HCT: 29.8 % — ABNORMAL LOW (ref 36.0–46.0)
Hemoglobin: 9.4 g/dL — ABNORMAL LOW (ref 12.0–15.0)
MCH: 26.6 pg (ref 26.0–34.0)
MCHC: 31.5 g/dL (ref 30.0–36.0)
MCV: 84.2 fL (ref 80.0–100.0)
Platelets: 180 10*3/uL (ref 150–400)
RBC: 3.54 MIL/uL — ABNORMAL LOW (ref 3.87–5.11)
RDW: 15.6 % — ABNORMAL HIGH (ref 11.5–15.5)
WBC: 15.2 10*3/uL — ABNORMAL HIGH (ref 4.0–10.5)
nRBC: 0.3 % — ABNORMAL HIGH (ref 0.0–0.2)

## 2018-07-16 MED ORDER — LACTATED RINGERS IV BOLUS: 500.0000 mL | Freq: Once | INTRAVENOUS | Status: DC

## 2018-07-16 MED ORDER — MAGNESIUM OXIDE 400 (241.3 MG) MG PO TABS: 400.0000 mg | ORAL_TABLET | Freq: Every day | ORAL | Status: DC

## 2018-07-16 MED ORDER — MAGNESIUM OXIDE 400 (241.3 MG) MG PO TABS
400.0000 mg | ORAL_TABLET | Freq: Every day | ORAL | Status: DC
  Administered 2018-07-16 – 2018-07-17 (×2): 400 mg via ORAL
  Filled 2018-07-16 (×2): qty 1

## 2018-07-16 MED ORDER — HYDROCHLOROTHIAZIDE 12.5 MG PO CAPS: 12.5000 mg | ORAL_CAPSULE | Freq: Every day | ORAL | Status: DC

## 2018-07-16 MED ORDER — HYDROCHLOROTHIAZIDE 25 MG PO TABS
25.0000 mg | ORAL_TABLET | Freq: Once | ORAL | Status: AC
  Administered 2018-07-16: 25 mg via ORAL
  Filled 2018-07-16: qty 1

## 2018-07-16 NOTE — Lactation Note (Signed)
This note was copied from a baby's chart. Lactation Consultation Note  Patient Name: Chloe Graves ASTMH'D Date: 07/16/2018 Reason for consult: Follow-up assessment;Infant < 6lbs;Preterm <34wks;NICU baby  Visited with mom of a 33 hours old pre-term < 3 lbs NICU female, baby is still on breastmilk through gavage. Mom voiced to [facility] that she's been pumping every 3 hours through the night and that she's now getting more "droplets" of colostrum, but not volume yet. Provided reassurance and praised her for her efforts.  Mom had questions about galactagogues, fenugreek and lactation cookies handout were discussed. Mom also voiced she was getting sore, LC asked her RN to get her some coconut oil and instructed mom how to use it. Reviewed treatment and prevention for sore nipples, engorgement prevention and treatment, mom unsure if she's getting discharge tomorrow.  Feeding plan:  1. Encouraged mom to continue pumping every 3 hours and at least once at night 2. Mom aware of labeling and storing her milk, will start turning it in once she get volume/drops 3. Coconut oil added to plan for breast care, mom will use it prior pumping and for breast massage  LC also showed her how to convert her Medela DEBP kit into a hand pump, mom was very please to know she can take her supplies home. Mom reported all questions and concerns were answered, she's aware of [place] services and will call PRN.  Maternal Data    Feeding Feeding Type: Donor Breast Milk  Interventions Interventions: Breast feeding basics reviewed;Coconut oil  Lactation Tools Discussed/Used Tools: Coconut oil   Consult Status Consult Status: PRN Date: 07/17/18 Follow-up type: In-patient    Chloe Graves 07/16/2018, 12:33 PM

## 2018-07-16 NOTE — Progress Notes (Signed)
POSTOPERATIVE DAY # 2 S/P repeat CS / severe PEC  S:         Reports feeling better - no pain or headache or epigastric pain / feels "tight from swelling"             Tolerating po intake / no nausea / no vomiting / + flatus / no BM             Bleeding is light             Pain controlled with Motrin mostly             Up ad lib / ambulatory/ voiding QS  Newborn in NICU  O:  VS: BP 136/86 (BP Location: Left Arm)   Pulse 85   Temp 98.2 F (36.8 C) (Oral)   Resp 18   Ht 5\' 2"  (1.575 m)   Wt 119.3 kg   LMP 12/24/2017 (Exact Date)   SpO2 100%   Breastfeeding Unknown   BMI 48.11 kg/m   BP:136/86 - 128/94 - 118/63 - 131/77 - 126/84 - 133/72  LABS:             Recent Labs    07/15/18 0530 07/16/18 0558  WBC 18.2* 15.2*  HGB 9.9* 9.4*  PLT 176 180   Results for Chloe Graves, Chloe Graves (MRN [id number]) as of 07/16/2018 11:22  Ref. Range  07/16/2018 05:58  Sodium Latest Ref Range: 135 - 145 mmol/L  138  Potassium Latest Ref Range: 3.5 - 5.1 mmol/L  4.4  Chloride Latest Ref Range: 98 - 111 mmol/L  107  CO2 Latest Ref Range: 22 - 32 mmol/L  24  Glucose Latest Ref Range: 70 - 99 mg/dL  86  BUN Latest Ref Range: 6 - 20 mg/dL  15  Creatinine Latest Ref Range: 0.44 - 1.00 mg/dL  3.83  Calcium Latest Ref Range: 8.9 - 10.3 mg/dL  8.0 (L)  Anion gap Latest Ref Range: 5 - 15   7  Alkaline Phosphatase Latest Ref Range: 38 - 126 U/L  59  Albumin Latest Ref Range: 3.5 - 5.0 g/dL  2.2 (L)  AST Latest Ref Range: 15 - 41 U/L 105 (2/27) 45 (H) (2/28)  ALT Latest Ref Range: 0 - 44 U/L 87 (2/27) 63 (H) (2/28)               Bloodtype: --/--/A POS, A POS Performed at [facility], [address], [place], [place] [address]  [phone])  Rubella:   Immune                    Weight: 2/27= 119.30                2/28= 119.31                         I&O: Intake/Output      02/27 0701 - 02/28 0700 02/28 0701 - 02/29 0700   P.O. 2634    I.V. (mL/kg) 225 (1.9)    Total Intake(mL/kg) 2859 (24)     Urine (mL/kg/hr) 2525 (0.9)    Blood     Total Output 2525    Net +334                   Physical Exam:             Alert and Oriented X3  Lungs: Clear and unlabored  Heart:  regular rate and rhythm / no mumurs  Abdomen: soft, non-tender, non-distended, panus soft without erythema             Fundus: firm, non-tender, Ueven             Dressing intact with wound vac              Incision:  approximated with suture / no erythema / no ecchymosis / no drainage  Perineum: intact  Lochia: light  Extremities: 2+ edema, no calf pain or tenderness, negative Homans  A:        POD # 2 S/P CS-repeat            Severe preeclampsia - labs improving and BP stable on labetalol (300 BID) and hydralazine (50 Q6)  P:        Routine postoperative care              HCTZ 25 today & 12.5 mg tomorrow for edema - continue I&O and daily weights                Marlinda Mike CNM, MSN, [facility] 07/16/2018, 11:20 AM

## 2018-07-17 MED ORDER — IBUPROFEN 600 MG PO TABS: 600.0000 mg | ORAL_TABLET | Freq: Three times a day (TID) | ORAL | 0 refills | Status: DC | PRN

## 2018-07-17 MED ORDER — HYDRALAZINE HCL 50 MG PO TABS: 50.0000 mg | ORAL_TABLET | Freq: Four times a day (QID) | ORAL | 1 refills | Status: DC

## 2018-07-17 MED ORDER — MAGNESIUM OXIDE 400 (241.3 MG) MG PO TABS: 400.0000 mg | ORAL_TABLET | Freq: Every day | ORAL | 0 refills | Status: DC

## 2018-07-17 MED ORDER — LABETALOL HCL 300 MG PO TABS: 300.0000 mg | ORAL_TABLET | Freq: Two times a day (BID) | ORAL | 1 refills | Status: DC

## 2018-07-17 MED ORDER — POLYSACCHARIDE IRON COMPLEX 150 MG PO CAPS: 150.0000 mg | ORAL_CAPSULE | Freq: Every day | ORAL | 0 refills | Status: DC

## 2018-07-17 MED ORDER — HYDROCHLOROTHIAZIDE 25 MG PO TABS
25.0000 mg | ORAL_TABLET | Freq: Every day | ORAL | Status: DC
  Administered 2018-07-17: 25 mg via ORAL
  Filled 2018-07-17: qty 1

## 2018-07-17 MED ORDER — OXYCODONE HCL 5 MG PO TABS: 5.0000 mg | ORAL_TABLET | Freq: Four times a day (QID) | ORAL | 0 refills | Status: DC | PRN

## 2018-07-17 MED ORDER — HYDROCHLOROTHIAZIDE 25 MG PO TABS: 25.0000 mg | ORAL_TABLET | Freq: Every day | ORAL | 0 refills | Status: DC

## 2018-07-17 NOTE — Progress Notes (Signed)
Patient discharge home with printed instructions. Pt verbalized an understanding. No concerns noted. Carmelina Dane, RN

## 2018-07-17 NOTE — Discharge Summary (Signed)
OB Discharge Summary  Patient Name: Chloe Graves DOB: January 25, 1991 MRN: [id number]  Date of admission: 07/08/2018  Admitting diagnosis: pre-eclampsia - severe Intrauterine pregnancy: [redacted]w[redacted]d     Secondary diagnosis: previous cesarean section   Date of discharge: 07/17/2018    Discharge diagnosis: Preterm Pregnancy Delivered @ 28 weeks by repeat CS / severe preeclampsia-improving / ABL anemia - stable / POD #3 s/p cesarean section delivery   Prenatal history: G3P0212   EDC : 09/30/2018, Alternate EDD Entry  Prenatal care at [facility]  Primary provider : Cousins Prenatal course complicated by previous PEC / previous CS / asthma / obesity  Prenatal Labs: ABO, Rh: --/--/A POS, A POS Performed at [facility], [address], [place], [place] [address]  [phone])  Antibody: NEG (02/24 5621) Rubella:   Immune RPR: Non Reactive (02/20 1807)  HBsAg:   negative HIV:   NR GBS:     negative                                 Hospital course:  Sceduled C/S   28 y.o. yo G3P0212 at [redacted]w[redacted]d was admitted to the hospital 07/08/2018 for hypertension with proteinuria symptomatic with headache and edema consistent with severe preeclampsia. HX previous delivery at 27 weeks with severe preeclampsia and cesarean delivery. Planned repeat cesarean section this pregnancy.   Initial hospital plan for magnesium sulfate prophylaxis / BMZ course/ hypertensive protocol to control blood pressure / NICU and MFM consult / close monitoring of FHR and BP status. [facility] on [facility]. Adjustment of antihypertensive therapy antepartum with labetalol and hydralazine therapy. Followed with daily fetal surveillance and inpatient management of hypertension.   Onset of epigastric pain 2/26 with some nausea. PIH labs reveal elevated LE - decision to proceed with cesarean delivery for worsening status - severe preeclampsia with severe range blood  pressure despite antihypertensive therapy.   Patient delivered a Viable infant.07/14/2018 at 28 weeks & 6 days via repeat cesarean section delivery.  Details of operation can be found in separate operative note.  Newborn transferred to NICU  Patient had an uncomplicated postpartum course with 24+ hours magnesium sulfate prophylaxis.  She is ambulating, tolerating a regular diet, passing flatus, and urinating well. Patient is discharged home in stable condition on  07/17/18 on POD #3 in stable condition. Blood pressure is well-controlled with labetalol  BID and Hydralazine  Q6 hours and HCTZ . Adequate diuresis with persistent dependent edema - improving but will require additional HCTZ x 7 days for edema resolution. Dressing intact with functional wound vac - planned removal at MD office in 3 days.    Delivering PROVIDER: COUSINS, SHERONETTE                                                            Complications: None  Newborn Data: Live born female  Birth Weight: 2 lb 1.9 oz (960 g) APGAR: 7, 8  Newborn Delivery   Birth date/time:  07/14/2018 08:56:00 Delivery type:  C-Section, Low Transverse Trial of labor:  No C-section categorization:  Repeat     Baby Feeding: Bottle and Breast Disposition:NICU  Post  partum procedures:none  Labs: Lab Results  Component Value Date   WBC 15.2 (H) 07/16/2018   HGB 9.4 (L) 07/16/2018   HCT 29.8 (L) 07/16/2018   MCV 84.2 07/16/2018   PLT 180 07/16/2018   CMP Latest Ref Rng & Units 07/16/2018  Glucose 70 - 99 mg/dL 86  BUN 6 - 20 mg/dL 15  Creatinine 2.62 - 0.35 mg/dL 5.97  Sodium 416 - 384 mmol/L 138  Potassium 3.5 - 5.1 mmol/L 4.4  Chloride 98 - 111 mmol/L 107  CO2 22 - 32 mmol/L 24  Calcium 8.9 - 10.3 mg/dL 8.0(L)  Total Protein 6.5 - 8.1 g/dL 5.3(M)  Total Bilirubin 0.3 - 1.2 mg/dL 0.5  Alkaline Phos 38 - 126 U/L 59  AST 15 - 41 U/L 45(H)  ALT 0 - 44 U/L 63(H)      Physical Exam @ time of discharge:  Vitals:    07/16/18 1930 07/16/18 2320 07/17/18 0336 07/17/18 0500  BP: 119/72 114/68 138/75   Pulse: 88 89 79   Resp: 19 18 18    Temp: 98.1 F (36.7 C) 98 F (36.7 C) 98.1 F (36.7 C)   TempSrc: Oral Oral Oral   SpO2: 100% 99% 100%   Weight:    118.3 kg  Height:        General: alert, cooperative and no distress Lochia: appropriate Uterine Fundus: firm Incision: Healing well with no significant drainage Extremities: DVT Evaluation: No evidence of DVT seen on physical exam.   Discharge instructions:  "Baby and Me Booklet" and [facility] Booklet  Discharge Medications:  Allergies as of 07/17/2018      Reactions   Latex Itching      Medication List    STOP taking these medications   NIFEdipine 30 MG 24 hr tablet Commonly known as:  PROCARDIA XL   NIFEdipine 60 MG 24 hr tablet Commonly known as:  ADALAT CC     TAKE these medications   albuterol (5 MG/ML) 0.5% Nebu Commonly known as:  PROVENTIL, VENTOLIN Take by nebulization continuous. Short of breath   CITRANATAL ASSURE PO Take by mouth.   hydrALAZINE 50 MG tablet Commonly known as:  APRESOLINE Take 1 tablet (50 mg total) by mouth every 6 (six) hours.   hydrochlorothiazide 25 MG tablet Commonly known as:  HYDRODIURIL Take 1 tablet (25 mg total) by mouth daily.   ibuprofen 600 MG tablet Commonly known as:  ADVIL,MOTRIN Take 1 tablet (600 mg total) by mouth every 8 (eight) hours as needed for mild pain or cramping.   iron polysaccharides 150 MG capsule Commonly known as:  NIFEREX Take 1 capsule (150 mg total) by mouth daily.   labetalol 300 MG tablet Commonly known as:  NORMODYNE Take 1 tablet (300 mg total) by mouth 2 (two) times daily.   magnesium oxide 400 (241.3 Mg) MG tablet Commonly known as:  MAG-OX Take 1 tablet (400 mg total) by mouth daily.   oxyCODONE 5 MG immediate release tablet Commonly known as:  Oxy IR/ROXICODONE Take 1 tablet (5 mg total) by mouth every 6 (six) hours as needed for moderate  pain.            Discharge Care Instructions  (From admission, onward)         Start     Ordered   07/17/18 0000  Discharge wound care:    Comments:  Leave honeycomb dressing and wound vac in place x 5 days - office visit Tuesday to remove dressing and wound vac  07/17/18 1008          Diet: low salt diet  Activity: Advance as tolerated. Pelvic rest x 6 weeks.   Follow up:3 days in MD office [facility])    Signed: Marlinda Mike CNM, MSN, FACNM 07/17/2018, 10:10 AM

## 2018-07-17 NOTE — Progress Notes (Signed)
POSTOPERATIVE DAY # 3 S/P repeat CS and severe preeclampsia  S:         Reports feeling good & wants to go home if possible              Denies any PEC symptoms - states swelling little better in upper thighs today             Tolerating po intake / no nausea / no vomiting / + flatus / + BM x 2 (last night and this AM)             Bleeding is light             Pain controlled with Motrin and Oxycodone             Up ad lib / ambulatory/ voiding QS  Newborn in NICU  O:  VS: BP 138/75 (BP Location: Left Arm)   Pulse 79   Temp 98.1 F (36.7 C) (Oral)   Resp 18   Ht 5\' 2"  (1.575 m)   Wt 118.3 kg   LMP 12/24/2017 (Exact Date)   SpO2 100%   Breastfeeding Unknown   BMI 47.69 kg/m   BP:  138/75 - 114/68 - 119/72 - 127/95 - 106/58  LABS:              Recent Labs    07/15/18 0530 07/16/18 0558  WBC 18.2* 15.2*  HGB 9.9* 9.4*  PLT 176 180      Weight:   118.27 KG down from 119.30                           I&O: Intake/Output      02/28 0701 - 02/29 0700 02/29 0701 - 03/01 0700   P.O. 630    I.V. (mL/kg)     Total Intake(mL/kg) 630 (5.3)    Urine (mL/kg/hr) 1200 (0.4)    Stool 0    Total Output 1200    Net -570         Stool Occurrence 1 x                 Physical Exam:             Alert and Oriented X3  Lungs: Clear and unlabored  Heart: regular rate and rhythm / no mumurs  Abdomen: soft, non-tender, non-distended, panus soft without erythema             Fundus: firm, non-tender, Ueven             Dressing intact with functional wound vac              Incision:  approximated with suture / no erythema / no ecchymosis / no drainage  Extremities: 2+ edema, no calf pain or tenderness, negative Homans  A/P:     POD # 3 S/P repeat CS             Severe preeclampsia - stable BP on two agents / decreased weight and edema with HCTZ             ABL anemia - stable / continue iron and mag ox              Routine postoperative care - discharge home             Office visit for  wound vac removal Tuesday at [facility] office - call Monday for apt   [facility]  Kathi Ludwig, MSN, [facility] 07/17/2018, 9:27 AM

## 2018-07-20 DIAGNOSIS — O1495 Unspecified pre-eclampsia, complicating the puerperium: Secondary | ICD-10-CM | POA: Diagnosis not present

## 2018-07-20 DIAGNOSIS — O1493 Unspecified pre-eclampsia, third trimester: Secondary | ICD-10-CM | POA: Diagnosis not present

## 2018-08-04 ENCOUNTER — Ambulatory Visit: Payer: Self-pay

## 2018-08-04 NOTE — Lactation Note (Signed)
This note was copied from a baby's chart. Lactation Consultation Note  Patient Name: Girl Valisa Crumrine EZMOQ'H Date: 08/04/2018   Mom called the [facility] line tonight and left a message. She's pumping every 2-3 hours, at night time her last pumping session is around 11 am, She's getting between 10-15 combined per pumping session. Left breast is the slacker boob per mom, and the right one is the one giving her the best yield. She's concerned about her milk supply and asked LC if she should be getting more by now. Baby is still in the NICU, offered mom to do a 1 on 1 consultation in baby's room while she's still in the hospital. Mom comes here everyday from 8 am-noon sometimes she's leaving later due to the school being out of session.  Mom reported zero breast changes during this pregnancy, she also voiced that she didn't get any milk at all during her fist pregnancy, but at least with this baby she's getting "something". Explained to mom that BF should have been almost fully established by 3-4 weeks, but that we'll work with her in her efforts to provide breastmilk for her baby is that is her goal.  Feeding plan:  1. Mom will start power pumping tomorrow, at least once/day for the next 1-2 weeks 2. She'll let baby's NICU RN know when she's ready to see lactation. LC will try to see mom tomorrow morning before noon if discharges allow 3. She'll continue doing hand expression, breast massage and using coconut oil prior pumping.  Mom agreeable with feeding plan and would like to be seen by lactation; left note for F/U in [facility] office. Mom reported all questions and concerns were answered, she's aware of LC services and will call PRN.  Maternal Data    Feeding Feeding Type: Donor Breast Milk   Interventions    Lactation Tools Discussed/Used     Consult Status      Sandra Tellefsen [name] 08/04/2018, 9:43 PM

## 2018-08-05 ENCOUNTER — Ambulatory Visit: Payer: Self-pay

## 2018-08-05 NOTE — Lactation Note (Signed)
This note was copied from a baby's chart. Lactation Consultation Note  Patient Name: Chloe Graves [id number] Date: 08/05/2018 Reason for consult: Follow-up assessment;Infant < 6lbs;Preterm <34wks;NICU baby  Visited with mom of a 20 weeks old < 3 lbs NICU female who is being fed exclusively breastmilk. Mom called last night and she had lots of questions about her milk supply, she's been reading a lot of information online and in blogs, LC helped to demystify it. Mom said she still wants to provide breastmilk to her baby and she's no planning on giving in yet, because this is "her fight" praised her for her efforts and spend a good amount of time listening to her and providing support.  Mom feels overwhelmed and discouraged because she wasn't able to produce a full supply for her first baby either, and she said "I can't even carry a baby full term, what is wrong with me!" her first baby also had a extended NICU stay due to prematurity. Mom asked what "a full milk supply is", baby is now getting 27 ml of breastmilk and she's producing between 10-15 ml combined per pumping session. Gave mom encouragement and let her know that she's heading on the right direction. Reviewed power pumping, eating/snacking during 2-3 of her pumping sessions and also how to manage those pumping sessions so she can get 6 hours of uninterrupted sleep, mom is very tired, with dark circles in her out, and stressed out (per mom).  Feeding plan:  1. Mom will start power pumping today, at least twice/day for the next 1-2 weeks 2. She'll continue doing as much STS as she can with baby 3. She'll continue doing hand expression, breast massage and using coconut oil prior pumping.  Mom agreeable with feeding plan and will her NICU RN when she's ready to be followed up. Mom reported all questions and concerns were answered, she's aware of LC services and will call PRN.  Maternal Data    Feeding Feeding Type: Donor Breast  Milk   Interventions Interventions: Breast feeding basics reviewed;Skin to skin  Lactation Tools Discussed/Used     Consult Status Consult Status: PRN Follow-up type: In-patient    Chloe Graves [name] 08/05/2018, 11:57 AM

## 2018-08-26 ENCOUNTER — Ambulatory Visit: Payer: Self-pay

## 2018-08-26 DIAGNOSIS — Z130 Encounter for screening for diseases of the blood and blood-forming organs and certain disorders involving the immune mechanism: Secondary | ICD-10-CM | POA: Diagnosis not present

## 2018-08-26 NOTE — Lactation Note (Signed)
This note was copied from a baby's chart. Lactation Consultation Note  Patient Name: Chloe Graves [id number] Date: 08/26/2018 Reason for consult: Follow-up assessment;NICU baby Called to NICU for a feeding assist.  Baby is 35 weeks PMA.  Mom has struggled with her milk supply.  She is pumping every 3 hours and obtains 15-30 mls.  Mom is very motivated to breastfeed and provide milk for her baby.  Baby continues to both bottle and gavage feed.  Mom has attempted latching baby to breast some.  Baby is currently awake.  Assisted with positioning baby in football hold.  Mom can easily hand express milk.  Baby does not open wide and holds tip of nipple in her mouth. Discussed using a nipple shield to stimulate suck and mom interested.  20 mm nipple shield applied.  Baby latched well and suckled off and on.  Mom very excited.  Discussed normal preterm feeding patterns.  Recommended mom and baby follow up outpatient after discharge.  Encouraged to call for assist/concerns prn.  Maternal Data    Feeding Feeding Type: Breast Fed Nipple Type: Nfant Slow Flow (purple)  LATCH Score Latch: Repeated attempts needed to sustain latch, nipple held in mouth throughout feeding, stimulation needed to elicit sucking reflex.  Audible Swallowing: A few with stimulation  Type of Nipple: Everted at rest and after stimulation  Comfort (Breast/Nipple): Soft / non-tender  Hold (Positioning): Assistance needed to correctly position infant at breast and maintain latch.  LATCH Score: 7  Interventions Interventions: Assisted with latch;Breast compression;Adjust position;Breast massage;Support pillows;Hand express  Lactation Tools Discussed/Used Tools: Nipple Shields Nipple shield size: 20   Consult Status Consult Status: PRN    [name] 08/26/2018, 2:29 PM

## 2018-11-24 ENCOUNTER — Other Ambulatory Visit: Payer: Self-pay | Admitting: Obstetrics and Gynecology

## 2018-11-29 ENCOUNTER — Encounter (HOSPITAL_COMMUNITY): Payer: BC Managed Care – PPO

## 2018-12-02 ENCOUNTER — Other Ambulatory Visit: Payer: Self-pay

## 2018-12-02 ENCOUNTER — Other Ambulatory Visit (HOSPITAL_COMMUNITY): Payer: BC Managed Care – PPO

## 2018-12-02 ENCOUNTER — Encounter (HOSPITAL_COMMUNITY)
Admission: RE | Admit: 2018-12-02 | Discharge: 2018-12-02 | Disposition: A | Discharge status: Home / Self Care | Payer: BC Managed Care – PPO | Source: Ambulatory Visit

## 2018-12-02 ENCOUNTER — Other Ambulatory Visit (HOSPITAL_COMMUNITY)
Admission: RE | Admit: 2018-12-02 | Discharge: 2018-12-02 | Disposition: A | Discharge status: Home / Self Care | Payer: BC Managed Care – PPO | Source: Ambulatory Visit | Attending: Obstetrics and Gynecology | Admitting: Obstetrics and Gynecology

## 2018-12-02 ENCOUNTER — Encounter (HOSPITAL_BASED_OUTPATIENT_CLINIC_OR_DEPARTMENT_OTHER): Payer: Self-pay | Admitting: *Deleted

## 2018-12-02 DIAGNOSIS — Z6841 Body Mass Index (BMI) 40.0 and over, adult: Secondary | ICD-10-CM | POA: Diagnosis not present

## 2018-12-02 DIAGNOSIS — D509 Iron deficiency anemia, unspecified: Secondary | ICD-10-CM | POA: Diagnosis not present

## 2018-12-02 DIAGNOSIS — Z01812 Encounter for preprocedural laboratory examination: Secondary | ICD-10-CM | POA: Diagnosis not present

## 2018-12-02 DIAGNOSIS — I10 Essential (primary) hypertension: Secondary | ICD-10-CM | POA: Diagnosis not present

## 2018-12-02 DIAGNOSIS — Z1159 Encounter for screening for other viral diseases: Secondary | ICD-10-CM | POA: Insufficient documentation

## 2018-12-02 DIAGNOSIS — Z87891 Personal history of nicotine dependence: Secondary | ICD-10-CM | POA: Diagnosis not present

## 2018-12-02 DIAGNOSIS — Z302 Encounter for sterilization: Secondary | ICD-10-CM | POA: Diagnosis not present

## 2018-12-02 LAB — CBC
HCT: 35.1 % — ABNORMAL LOW (ref 36.0–46.0)
Hemoglobin: 11.1 g/dL — ABNORMAL LOW (ref 12.0–15.0)
MCH: 25.3 pg — ABNORMAL LOW (ref 26.0–34.0)
MCHC: 31.6 g/dL (ref 30.0–36.0)
MCV: 80 fL (ref 80.0–100.0)
Platelets: 402 10*3/uL — ABNORMAL HIGH (ref 150–400)
RBC: 4.39 MIL/uL (ref 3.87–5.11)
RDW: 18.4 % — ABNORMAL HIGH (ref 11.5–15.5)
WBC: 7.9 10*3/uL (ref 4.0–10.5)
nRBC: 0 % (ref 0.0–0.2)

## 2018-12-02 NOTE — Progress Notes (Signed)

## 2018-12-02 NOTE — Progress Notes (Signed)
Spoke w/ pt via phone for pre-op interview.  Npo after mn.  Arrive at 0800.  Needs urine preg.  Pt had cbc and covid test done today.

## 2018-12-03 LAB — SARS CORONAVIRUS 2 (TAT 6-24 HRS): SARS Coronavirus 2: NEGATIVE

## 2018-12-06 ENCOUNTER — Other Ambulatory Visit: Payer: Self-pay

## 2018-12-06 ENCOUNTER — Encounter (HOSPITAL_BASED_OUTPATIENT_CLINIC_OR_DEPARTMENT_OTHER): Payer: Self-pay | Admitting: *Deleted

## 2018-12-06 ENCOUNTER — Encounter (HOSPITAL_BASED_OUTPATIENT_CLINIC_OR_DEPARTMENT_OTHER)
Admission: RE | Disposition: A | Discharge status: Home / Self Care | Payer: Self-pay | Source: Home / Self Care | Attending: Obstetrics and Gynecology

## 2018-12-06 ENCOUNTER — Ambulatory Visit (HOSPITAL_BASED_OUTPATIENT_CLINIC_OR_DEPARTMENT_OTHER): Payer: BC Managed Care – PPO | Admitting: Anesthesiology

## 2018-12-06 ENCOUNTER — Ambulatory Visit (HOSPITAL_BASED_OUTPATIENT_CLINIC_OR_DEPARTMENT_OTHER)
Admission: RE | Admit: 2018-12-06 | Discharge: 2018-12-06 | Disposition: A | Discharge status: Home / Self Care | Payer: BC Managed Care – PPO | Attending: Obstetrics and Gynecology | Admitting: Obstetrics and Gynecology

## 2018-12-06 DIAGNOSIS — I10 Essential (primary) hypertension: Secondary | ICD-10-CM | POA: Diagnosis not present

## 2018-12-06 DIAGNOSIS — Z87891 Personal history of nicotine dependence: Secondary | ICD-10-CM | POA: Insufficient documentation

## 2018-12-06 DIAGNOSIS — D509 Iron deficiency anemia, unspecified: Secondary | ICD-10-CM | POA: Diagnosis not present

## 2018-12-06 DIAGNOSIS — Z6841 Body Mass Index (BMI) 40.0 and over, adult: Secondary | ICD-10-CM | POA: Diagnosis not present

## 2018-12-06 DIAGNOSIS — Z302 Encounter for sterilization: Secondary | ICD-10-CM | POA: Diagnosis not present

## 2018-12-06 HISTORY — PX: LAPAROSCOPIC TUBAL LIGATION: SHX1937

## 2018-12-06 HISTORY — DX: Iron deficiency anemia, unspecified: D50.9

## 2018-12-06 HISTORY — DX: Personal history of other complications of pregnancy, childbirth and the puerperium: Z87.59

## 2018-12-06 LAB — POCT PREGNANCY, URINE: Preg Test, Ur: NEGATIVE

## 2018-12-06 SURGERY — LIGATION, FALLOPIAN TUBE, LAPAROSCOPIC
Anesthesia: General | Site: Abdomen | Laterality: Bilateral

## 2018-12-06 MED ORDER — SUCCINYLCHOLINE CHLORIDE 200 MG/10ML IV SOSY
PREFILLED_SYRINGE | INTRAVENOUS | Status: AC
  Filled 2018-12-06: qty 10

## 2018-12-06 MED ORDER — MIDAZOLAM HCL 5 MG/5ML IJ SOLN
INTRAMUSCULAR | Status: DC | PRN
  Administered 2018-12-06: 2 mg via INTRAVENOUS

## 2018-12-06 MED ORDER — ONDANSETRON HCL 4 MG/2ML IJ SOLN
INTRAMUSCULAR | Status: DC | PRN
  Administered 2018-12-06: 4 mg via INTRAVENOUS

## 2018-12-06 MED ORDER — MEPERIDINE HCL 25 MG/ML IJ SOLN
6.2500 mg | INTRAMUSCULAR | Status: DC | PRN
  Filled 2018-12-06: qty 1

## 2018-12-06 MED ORDER — LIDOCAINE 2% (20 MG/ML) 5 ML SYRINGE
INTRAMUSCULAR | Status: AC
  Filled 2018-12-06: qty 5

## 2018-12-06 MED ORDER — LIDOCAINE 2% (20 MG/ML) 5 ML SYRINGE
INTRAMUSCULAR | Status: DC | PRN
  Administered 2018-12-06: 100 mg via INTRAVENOUS

## 2018-12-06 MED ORDER — FENTANYL CITRATE (PF) 100 MCG/2ML IJ SOLN
INTRAMUSCULAR | Status: AC
  Filled 2018-12-06: qty 2

## 2018-12-06 MED ORDER — DEXAMETHASONE SODIUM PHOSPHATE 10 MG/ML IJ SOLN
INTRAMUSCULAR | Status: DC | PRN
  Administered 2018-12-06: 10 mg via INTRAVENOUS

## 2018-12-06 MED ORDER — LACTATED RINGERS IV SOLN
INTRAVENOUS | Status: DC
  Administered 2018-12-06: 09:00:00 via INTRAVENOUS
  Administered 2018-12-06: 50 mL/h via INTRAVENOUS
  Filled 2018-12-06: qty 1000

## 2018-12-06 MED ORDER — ACETAMINOPHEN 325 MG PO TABS
ORAL_TABLET | ORAL | Status: DC | PRN
  Administered 2018-12-06: 1000 mg via ORAL

## 2018-12-06 MED ORDER — ROCURONIUM BROMIDE 50 MG/5ML IV SOSY
PREFILLED_SYRINGE | INTRAVENOUS | Status: DC | PRN
  Administered 2018-12-06: 20 mg via INTRAVENOUS

## 2018-12-06 MED ORDER — HYDROMORPHONE HCL 1 MG/ML IJ SOLN
0.2500 mg | INTRAMUSCULAR | Status: DC | PRN
  Filled 2018-12-06: qty 0.5

## 2018-12-06 MED ORDER — ONDANSETRON HCL 4 MG/2ML IJ SOLN
INTRAMUSCULAR | Status: AC
  Filled 2018-12-06: qty 2

## 2018-12-06 MED ORDER — ACETAMINOPHEN 500 MG PO TABS
ORAL_TABLET | ORAL | Status: AC
  Filled 2018-12-06: qty 2

## 2018-12-06 MED ORDER — ONDANSETRON HCL 4 MG/2ML IJ SOLN
4.0000 mg | Freq: Once | INTRAMUSCULAR | Status: AC
  Administered 2018-12-06: 4 mg via INTRAVENOUS
  Filled 2018-12-06: qty 2

## 2018-12-06 MED ORDER — PROPOFOL 10 MG/ML IV BOLUS
INTRAVENOUS | Status: AC
  Filled 2018-12-06: qty 40

## 2018-12-06 MED ORDER — ROCURONIUM BROMIDE 10 MG/ML (PF) SYRINGE
PREFILLED_SYRINGE | INTRAVENOUS | Status: AC
  Filled 2018-12-06: qty 10

## 2018-12-06 MED ORDER — KETOROLAC TROMETHAMINE 30 MG/ML IJ SOLN
INTRAMUSCULAR | Status: AC
  Filled 2018-12-06: qty 1

## 2018-12-06 MED ORDER — KETOROLAC TROMETHAMINE 30 MG/ML IJ SOLN
INTRAMUSCULAR | Status: DC | PRN
  Administered 2018-12-06: 30 mg via INTRAVENOUS

## 2018-12-06 MED ORDER — SUCCINYLCHOLINE CHLORIDE 200 MG/10ML IV SOSY
PREFILLED_SYRINGE | INTRAVENOUS | Status: DC | PRN
  Administered 2018-12-06: 120 mg via INTRAVENOUS

## 2018-12-06 MED ORDER — HYDROCODONE-ACETAMINOPHEN 5-325 MG PO TABS: 1.0000 | ORAL_TABLET | Freq: Four times a day (QID) | ORAL | 0 refills | Status: AC | PRN

## 2018-12-06 MED ORDER — BUPIVACAINE HCL (PF) 0.25 % IJ SOLN
INTRAMUSCULAR | Status: DC | PRN
  Administered 2018-12-06: 10 mL

## 2018-12-06 MED ORDER — FENTANYL CITRATE (PF) 100 MCG/2ML IJ SOLN
INTRAMUSCULAR | Status: DC | PRN
  Administered 2018-12-06: 100 ug via INTRAVENOUS
  Administered 2018-12-06 (×2): 50 ug via INTRAVENOUS

## 2018-12-06 MED ORDER — IBUPROFEN 800 MG PO TABS: 800.0000 mg | ORAL_TABLET | Freq: Three times a day (TID) | ORAL | 2 refills | Status: DC | PRN

## 2018-12-06 MED ORDER — PROPOFOL 10 MG/ML IV BOLUS
INTRAVENOUS | Status: DC | PRN
  Administered 2018-12-06: 50 mg via INTRAVENOUS
  Administered 2018-12-06: 150 mg via INTRAVENOUS

## 2018-12-06 MED ORDER — DEXAMETHASONE SODIUM PHOSPHATE 10 MG/ML IJ SOLN
INTRAMUSCULAR | Status: AC
  Filled 2018-12-06: qty 1

## 2018-12-06 MED ORDER — SUGAMMADEX SODIUM 200 MG/2ML IV SOLN
INTRAVENOUS | Status: DC | PRN
  Administered 2018-12-06: 200 mg via INTRAVENOUS

## 2018-12-06 MED ORDER — MIDAZOLAM HCL 2 MG/2ML IJ SOLN
INTRAMUSCULAR | Status: AC
  Filled 2018-12-06: qty 2

## 2018-12-06 MED ORDER — OXYCODONE HCL 5 MG/5ML PO SOLN
5.0000 mg | Freq: Once | ORAL | Status: DC | PRN
  Filled 2018-12-06: qty 5

## 2018-12-06 MED ORDER — OXYCODONE HCL 5 MG PO TABS
5.0000 mg | ORAL_TABLET | Freq: Once | ORAL | Status: DC | PRN
  Filled 2018-12-06: qty 1

## 2018-12-06 MED ORDER — PROMETHAZINE HCL 25 MG/ML IJ SOLN
6.2500 mg | INTRAMUSCULAR | Status: DC | PRN
  Filled 2018-12-06: qty 1

## 2018-12-06 MED ORDER — BUPIVACAINE HCL (PF) 0.25 % IJ SOLN
INTRAMUSCULAR | Status: AC
  Filled 2018-12-06: qty 30

## 2018-12-06 SURGICAL SUPPLY — 27 items
ADH SKN CLS APL DERMABOND .7 (GAUZE/BANDAGES/DRESSINGS) ×1
CATH ROBINSON RED A/P 16FR (CATHETERS) ×1 IMPLANT
DERMABOND ADVANCED (GAUZE/BANDAGES/DRESSINGS) ×1
DERMABOND ADVANCED .7 DNX12 (GAUZE/BANDAGES/DRESSINGS) IMPLANT
DRSG OPSITE POSTOP 3X4 (GAUZE/BANDAGES/DRESSINGS) IMPLANT
DURAPREP 26ML APPLICATOR (WOUND CARE) ×2 IMPLANT
GLOVE BIOGEL PI IND STRL 7.0 (GLOVE) ×2 IMPLANT
GLOVE BIOGEL PI INDICATOR 7.0 (GLOVE) ×2
GLOVE ECLIPSE 6.5 STRL STRAW (GLOVE) ×1 IMPLANT
GLOVE SURG SS PI 6.5 STRL IVOR (GLOVE) ×2 IMPLANT
GOWN STRL REUS W/ TWL LRG LVL3 (GOWN DISPOSABLE) ×2 IMPLANT
GOWN STRL REUS W/TWL LRG LVL3 (GOWN DISPOSABLE) ×4
KIT TURNOVER KIT B (KITS) ×2 IMPLANT
NDL INSUFFLATION 14GA 120MM (NEEDLE) ×1 IMPLANT
NEEDLE INSUFFLATION 14GA 120MM (NEEDLE) ×2 IMPLANT
PACK LAPAROSCOPY BASIN (CUSTOM PROCEDURE TRAY) ×2 IMPLANT
PACK TRENDGUARD 450 HYBRID PRO (MISCELLANEOUS) IMPLANT
PENCIL BUTTON HOLSTER BLD 10FT (ELECTRODE) ×1 IMPLANT
PROTECTOR NERVE ULNAR (MISCELLANEOUS) ×4 IMPLANT
SET TUBE SMOKE EVAC HIGH FLOW (TUBING) ×2 IMPLANT
SLEEVE ENDOPATH XCEL 5M (ENDOMECHANICALS) ×2 IMPLANT
SUT VICRYL 0 UR6 27IN ABS (SUTURE) ×2 IMPLANT
SUT VICRYL 4-0 PS2 18IN ABS (SUTURE) ×2 IMPLANT
TRENDGUARD 450 HYBRID PRO PACK (MISCELLANEOUS)
TROCAR OPTI TIP 5M 100M (ENDOMECHANICALS) ×4 IMPLANT
TROCAR XCEL DIL TIP R 11M (ENDOMECHANICALS) ×2 IMPLANT
WARMER LAPAROSCOPE (MISCELLANEOUS) ×2 IMPLANT

## 2018-12-06 NOTE — Anesthesia Preprocedure Evaluation (Signed)
Anesthesia Evaluation  Patient identified by MRN, date of birth, ID band Patient awake    Reviewed: Allergy & Precautions, NPO status , Patient's Chart, lab work & pertinent test resultsPreop documentation limited or incomplete due to emergent nature of procedure.  Airway Mallampati: III  TM Distance: >3 FB Neck ROM: Full    Dental no notable dental hx. (+) Teeth Intact, Dental Advisory Given   Pulmonary neg pulmonary ROS, former smoker,    Pulmonary exam normal breath sounds clear to auscultation       Cardiovascular hypertension, Pt. on home beta blockers and Pt. on medications negative cardio ROS Normal cardiovascular exam Rhythm:Regular Rate:Normal     Neuro/Psych negative neurological ROS  negative psych ROS   GI/Hepatic negative GI ROS, Neg liver ROS,   Endo/Other  negative endocrine ROSMorbid obesity  Renal/GU negative Renal ROS  negative genitourinary   Musculoskeletal negative musculoskeletal ROS (+)   Abdominal (+) + obese,   Peds negative pediatric ROS (+)  Hematology negative hematology ROS (+)   Anesthesia Other Findings   Reproductive/Obstetrics negative OB ROS                             Lab Results  Component Value Date   WBC 7.9 12/02/2018   HGB 11.1 (L) 12/02/2018   HCT 35.1 (L) 12/02/2018   MCV 80.0 12/02/2018   PLT 402 (H) 12/02/2018    Anesthesia Physical  Anesthesia Plan  ASA: III  Anesthesia Plan: General   Post-op Pain Management:    Induction: Intravenous  PONV Risk Score and Plan: 3 and Ondansetron, Dexamethasone, Midazolam and Treatment may vary due to age or medical condition  Airway Management Planned: Oral ETT  Additional Equipment:   Intra-op Plan:   Post-operative Plan: Extubation in OR  Informed Consent: I have reviewed the patients History and Physical, chart, labs and discussed the procedure including the risks, benefits and  alternatives for the proposed anesthesia with the patient or authorized representative who has indicated his/her understanding and acceptance.     Dental advisory given  Plan Discussed with:   Anesthesia Plan Comments:         Anesthesia Quick Evaluation

## 2018-12-06 NOTE — Discharge Instructions (Signed)
°  Post Anesthesia Home Care Instructions  Activity: Get plenty of rest for the remainder of the day. A responsible individual must stay with you for 24 hours following the procedure.  For the next 24 hours, DO NOT: -Drive a car -Paediatric nurse -Drink alcoholic beverages -Take any medication unless instructed by your physician -Make any legal decisions or sign important papers.  Meals: Start with liquid foods such as gelatin or soup. Progress to regular foods as tolerated. Avoid greasy, spicy, heavy foods. If nausea and/or vomiting occur, drink only clear liquids until the nausea and/or vomiting subsides. Call your physician if vomiting continues.  Special Instructions/Symptoms: Your throat may feel dry or sore from the anesthesia or the breathing tube placed in your throat during surgery. If this causes discomfort, gargle with warm salt water. The discomfort should disappear within 24 hours.  If you had a scopolamine patch placed behind your ear for the management of post- operative nausea and/or vomiting:  1. The medication in the patch is effective for 72 hours, after which it should be removed.  Wrap patch in a tissue and discard in the trash. Wash hands thoroughly with soap and water. 2. You may remove the patch earlier than 72 hours if you experience unpleasant side effects which may include dry mouth, dizziness or visual disturbances. 3. Avoid touching the patch. Wash your hands with soap and water after contact with the patch.    Warm compress to abdomen every 4 hrs x 24 hrs

## 2018-12-06 NOTE — H&P (Signed)
Chloe Graves is an 28 y.o. female P2 MF presents for permanent sterilization  Pertinent Gynecological History: Menses: flow is moderate Bleeding: nl Contraception: none DES exposure: denies Blood transfusions: none Sexually transmitted diseases: no past history Previous GYN Procedures: c/s  Last mammogram: n/a Date: n/a Last pap: normal Date: 2020 OB History: P2   Menstrual History: Menarche age: n/a Patient's last menstrual period was 11/29/2018 (approximate).    Past Medical History:  Diagnosis Date  . H/O candidiasis   . H/O cystitis   . H/O rubella   . H/O varicella   . History of pregnancy induced hypertension   . IDA (iron deficiency anemia)     Past Surgical History:  Procedure Laterality Date  . CESAREAN SECTION  11/16/2011   Procedure: CESAREAN SECTION;  Surgeon: Betsy Coder, MD;  Location: [facility];  Service: Gynecology;  Laterality: N/A;  Primary cesarean section with delivery of baby boy at 31.  Apgars 6/8.  Marland Kitchen CESAREAN SECTION N/A 07/14/2018   Procedure: CESAREAN SECTION;  Surgeon: Servando Salina, MD;  Location: [facility];  Service: Obstetrics;  Laterality: N/A;  . WISDOM TOOTH EXTRACTION      Family History  Problem Relation Age of Onset  . Cancer Maternal Aunt   . Cancer Maternal Grandmother     Social History:  reports that she quit smoking about 2 years ago. Her smoking use included cigarettes. She quit after 1.00 year of use. She has never used smokeless tobacco. She reports current alcohol use of about 1.0 standard drinks of alcohol per week. She reports that she does not use drugs.  Allergies:  Allergies  Allergen Reactions  . Latex Itching    Medications Prior to Admission  Medication Sig Dispense Refill Last Dose  . iron polysaccharides (NIFEREX) 150 MG capsule Take 1 capsule (150 mg total) by mouth daily. 30 capsule 0   . Prenat w/o A-[id number]-[id number] (CITRANATAL ASSURE PO) Take by mouth.       Review of Systems  All  other systems reviewed and are negative.   Blood pressure 127/82, pulse 92, temperature 98 F (36.7 C), temperature source Oral, resp. rate 18, height 5\' 2"  (1.575 m), weight 109.1 kg, last menstrual period 11/29/2018, SpO2 99 %, not currently breastfeeding. Physical Exam  Constitutional: She appears well-developed and well-nourished.  HENT:  Head: Atraumatic.  Eyes: EOM are normal.  Neck: Neck supple.  Cardiovascular: Regular rhythm.  Respiratory: Breath sounds normal.  GI: Soft.  Obese, pfannenstiel skin incision  Genitourinary:    Vagina and uterus normal.   Musculoskeletal: Normal range of motion.  Skin: Skin is warm and dry.  Psychiatric: She has a normal mood and affect.    No results found for this or any previous visit (from the past 24 hour(s)).  No results found.  Assessment/Plan: Desires sterilization P) LTL with bipolar cautery. Risk of surgery reviewed including infection, bleeding, injury to underlying organ structures, failure rate 1/500-1/600, non reversible, permanent sterilization.  Thermal injury All ? answered  Florean Hoobler A Tamon Parkerson 12/06/2018, 8:01 AM

## 2018-12-06 NOTE — Anesthesia Procedure Notes (Signed)
Procedure Name: LMA Insertion Date/Time: 12/06/2018 10:05 AM Performed by: Bonney Aid, CRNA Pre-anesthesia Checklist: Patient identified, Emergency Drugs available, Suction available and Patient being monitored Patient Re-evaluated:Patient Re-evaluated prior to induction Oxygen Delivery Method: Circle system utilized Preoxygenation: Pre-oxygenation with 100% oxygen Induction Type: IV induction Ventilation: Mask ventilation without difficulty Laryngoscope Size: Mac and 3 Grade View: Grade I Tube type: Oral Tube size: 7.0 mm Number of attempts: 1 Airway Equipment and Method: Bite block Placement Confirmation: positive ETCO2 Secured at: 21 cm Tube secured with: Tape Dental Injury: Teeth and Oropharynx as per pre-operative assessment

## 2018-12-06 NOTE — Op Note (Signed)
NAME: Chloe Graves, HALLS MEDICAL RECORD IO:[id number] ACCOUNT [id number] DATE OF BIRTH:12-Jan-1991 FACILITY: [facility] LOCATION: [facility] PHYSICIAN:Zai Chmiel A. Phuong Hillary, MD  OPERATIVE REPORT  DATE OF PROCEDURE:  12/06/2018  PREOPERATIVE DIAGNOSES:  Desires sterilization, previous cesarean section.  PROCEDURE:  Laparoscopic tubal ligation with bipolar cautery.  POSTOPERATIVE DIAGNOSES:  Desires sterilization, previous cesarean section.  ANESTHESIA:  General.  SURGEON:  Servando Salina, MD  ASSISTANT:  None.  DESCRIPTION OF PROCEDURE:  Under adequate general anesthesia, the patient was placed in the dorsal lithotomy position.  She was sterilely prepped and draped in usual fashion.  Bladder was catheterized a moderate amount of urine.  Bivalve speculum was  placed in the vagina.  Single tooth tenaculum was placed on the anterior lip of the cervix and a Kahn cannula was introduced into the cervical os and attached to the tenaculum for manipulation of the uterus.  Bivalve speculum was removed.  Attention was  then turned to the abdomen.  Marcaine 0.25% was injected infraumbilically and a vertical infraumbilical incision was made.  Veress needle was introduced and tested with saline.  Opening pressure of 10 was noted, 2.9 liters of CO2 was insufflated.  Veress  needle was then removed.  A 10 mm disposable trocar with sleeve was introduced into the abdomen without incident.  A lighted video laparoscope was introduced.  Panoramic inspection showed no trauma on entry.  Normal liver edge was noted.  Both fallopian  tubes were noted.  Distally on the right, there was thicker plumper tube and the left was the same.  Ovaries were normal.  Uterus was normal.  No endometriosis of the anterior or posterior cul-de-sac.  A small incision was made in the suprapubic site.   A 5 mm port was placed under direct visualization and using the bipolar cautery, the mid portion of both tubes were burned.  Once this  was adequate, the suprapubic port  was removed.  The abdomen was deflated and the infraumbilical port was removed  under direct visualization taking care not to bring up any underlying structures.  The incisions were closed with 4-0 Vicryl subcuticular closure.  Instruments from the vagina were removed.  SPECIMEN:  None.  ESTIMATED BLOOD LOSS:  Minimal.  COMPLICATIONS:  None.  The patient tolerated the procedure well and was transferred to recovery room in stable condition.  TN/NUANCE  D:12/06/2018 T:12/06/2018 JOB:[id number]

## 2018-12-06 NOTE — Transfer of Care (Addendum)
Immediate Anesthesia Transfer of Care Note  Patient: Chloe Graves  Procedure(s) Performed: LAPAROSCOPIC TUBAL LIGATION With Bipolar Cautery (Bilateral Abdomen)  Patient Location: PACU  Anesthesia Type:General  Level of Consciousness: awake, alert  and oriented  Airway & Oxygen Therapy: Patient Spontanous Breathing and Patient connected to nasal cannula oxygen  Post-op Assessment: Report given to RN  Post vital signs: Reviewed and stable  Last Vitals: 119/55, 87, 24, 100% Vitals Value Taken Time  BP    Temp    Pulse    Resp    SpO2      Last Pain:  Vitals:   12/06/18 0833  TempSrc:   PainSc: 0-No pain      Patients Stated Pain Goal: 6 (19/62/22 9798)  Complications: No apparent anesthesia complications

## 2018-12-06 NOTE — Brief Op Note (Signed)
12/06/2018  11:00 AM  PATIENT:  Bethann Goo  28 y.o. female  PRE-OPERATIVE DIAGNOSIS:  Desires Sterilization, previous Cesarean section  POST-OPERATIVE DIAGNOSIS:  Desires Sterilization,  Previous cesarean section  PROCEDURE:  LTL with bipolar cautery  SURGEON:  Surgeon(s) and Role:    * Servando Salina, MD - Primary  PHYSICIAN ASSISTANT:   ASSISTANTS: none   ANESTHESIA:   general Findings: nl ovaries, nl liver edge. Nl uterus, no evidence of endometriosis in post or ant cul de sac EBL:  5 mL   BLOOD ADMINISTERED:none  DRAINS: none   LOCAL MEDICATIONS USED:  MARCAINE     SPECIMEN:  No Specimen  DISPOSITION OF SPECIMEN:  N/A  COUNTS:  YES  TOURNIQUET:  * No tourniquets in log *  DICTATION: .Other Dictation: Dictation Number [id number]  PLAN OF CARE: Discharge to home after PACU  PATIENT DISPOSITION:  PACU - hemodynamically stable.   Delay start of Pharmacological VTE agent (>24hrs) due to surgical blood loss or risk of bleeding: no

## 2018-12-07 ENCOUNTER — Encounter (HOSPITAL_BASED_OUTPATIENT_CLINIC_OR_DEPARTMENT_OTHER): Payer: Self-pay | Admitting: Obstetrics and Gynecology

## 2018-12-07 NOTE — Anesthesia Postprocedure Evaluation (Signed)
Anesthesia Post Note  Patient: Chloe Graves  Procedure(s) Performed: LAPAROSCOPIC TUBAL LIGATION With Bipolar Cautery (Bilateral Abdomen)     Patient location during evaluation: PACU Anesthesia Type: General Level of consciousness: awake and alert Pain management: pain level controlled Vital Signs Assessment: post-procedure vital signs reviewed and stable Respiratory status: spontaneous breathing, nonlabored ventilation and respiratory function stable Cardiovascular status: blood pressure returned to baseline and stable Postop Assessment: no apparent nausea or vomiting Anesthetic complications: no    Last Vitals:  Vitals:   12/06/18 1215 12/06/18 1321  BP: 114/64 102/75  Pulse: 62 63  Resp: 18 17  Temp:  36.6 C  SpO2: 99% 100%    Last Pain:  Vitals:   12/06/18 1355  TempSrc:   PainSc: [place]

## 2019-01-26 DIAGNOSIS — F4325 Adjustment disorder with mixed disturbance of emotions and conduct: Secondary | ICD-10-CM | POA: Diagnosis not present

## 2019-02-10 DIAGNOSIS — F4325 Adjustment disorder with mixed disturbance of emotions and conduct: Secondary | ICD-10-CM | POA: Diagnosis not present

## 2019-02-24 DIAGNOSIS — F4325 Adjustment disorder with mixed disturbance of emotions and conduct: Secondary | ICD-10-CM | POA: Diagnosis not present

## 2019-04-22 DIAGNOSIS — F4310 Post-traumatic stress disorder, unspecified: Secondary | ICD-10-CM | POA: Diagnosis not present

## 2019-04-25 ENCOUNTER — Other Ambulatory Visit: Payer: Self-pay

## 2019-04-25 DIAGNOSIS — Z20822 Contact with and (suspected) exposure to covid-19: Secondary | ICD-10-CM

## 2019-04-26 LAB — NOVEL CORONAVIRUS, NAA: SARS-CoV-2, NAA: NOT DETECTED

## 2019-05-26 DIAGNOSIS — F4310 Post-traumatic stress disorder, unspecified: Secondary | ICD-10-CM | POA: Diagnosis not present

## 2019-06-14 DIAGNOSIS — F4310 Post-traumatic stress disorder, unspecified: Secondary | ICD-10-CM | POA: Diagnosis not present

## 2019-06-22 DIAGNOSIS — F4310 Post-traumatic stress disorder, unspecified: Secondary | ICD-10-CM | POA: Diagnosis not present

## 2020-01-06 DIAGNOSIS — Z20828 Contact with and (suspected) exposure to other viral communicable diseases: Secondary | ICD-10-CM | POA: Diagnosis not present

## 2020-02-29 DIAGNOSIS — Z20822 Contact with and (suspected) exposure to covid-19: Secondary | ICD-10-CM | POA: Diagnosis not present

## 2020-10-03 IMAGING — US US MFM FETAL BPP W/O NON-STRESS
1 series · 13 of 28 positions shown · non-contrast
Comparison: none

[Series 1: us mfm fetal bpp w/o non-stress · 57 acquisitions, 13 frames shown]
[im 3/57]
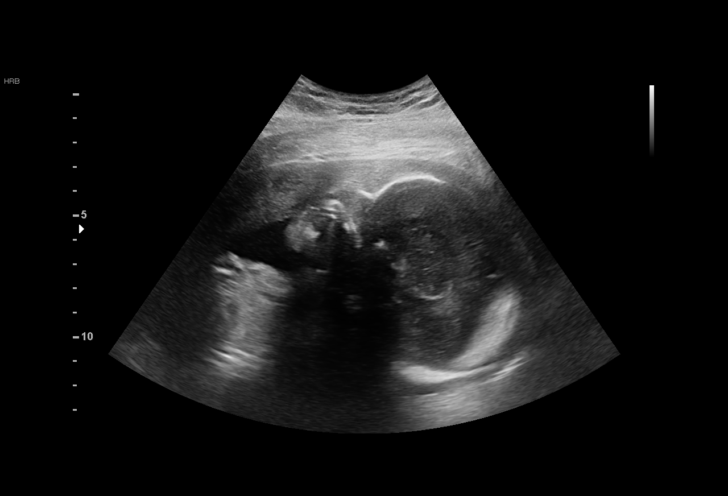
[im 7/57]
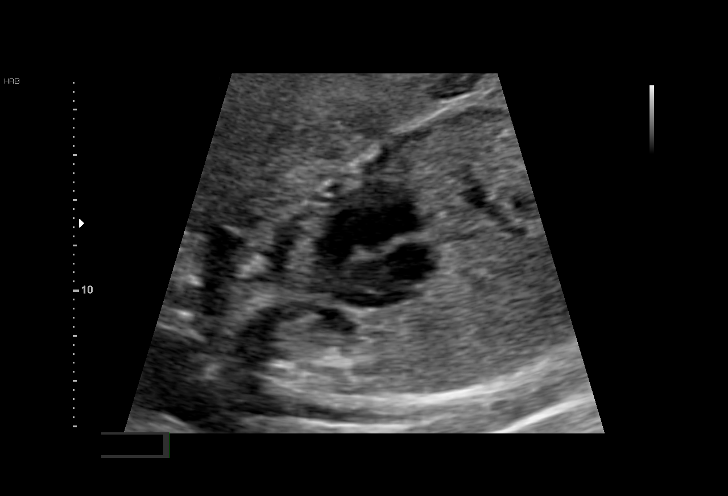
[im 11/57]
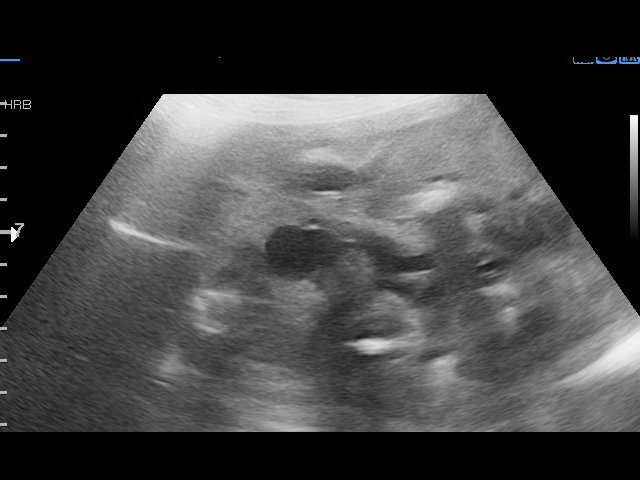
[im 15/57]
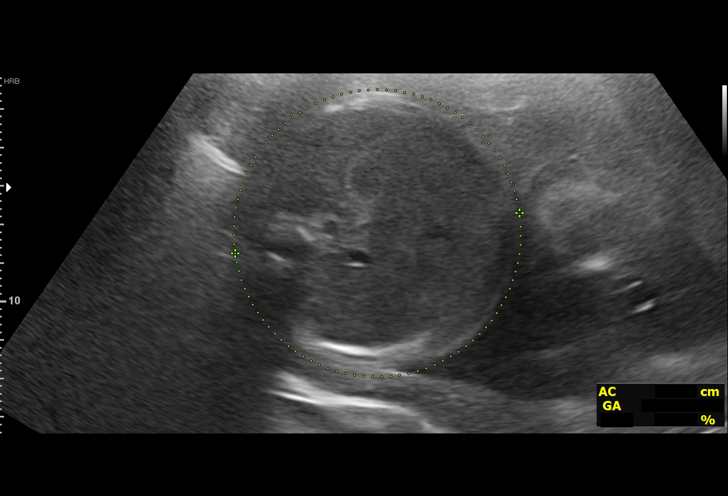
[im 19/57]
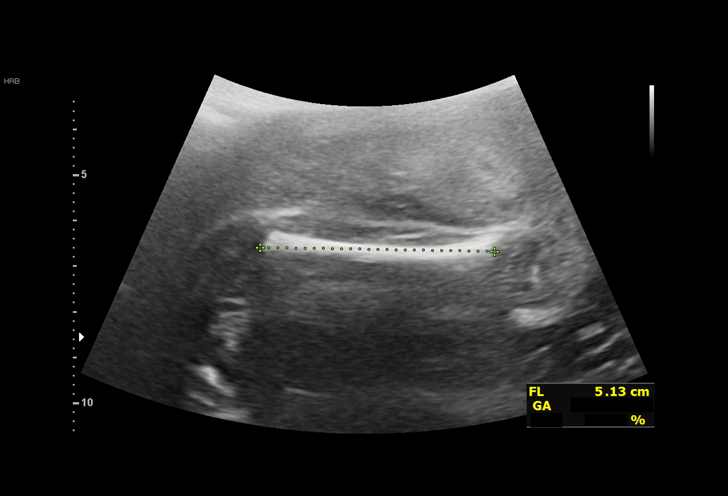
[im 23/57]
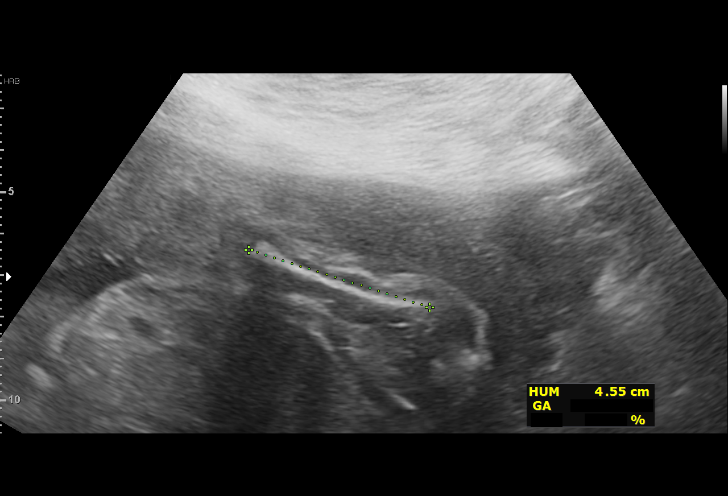
[im 30/57]
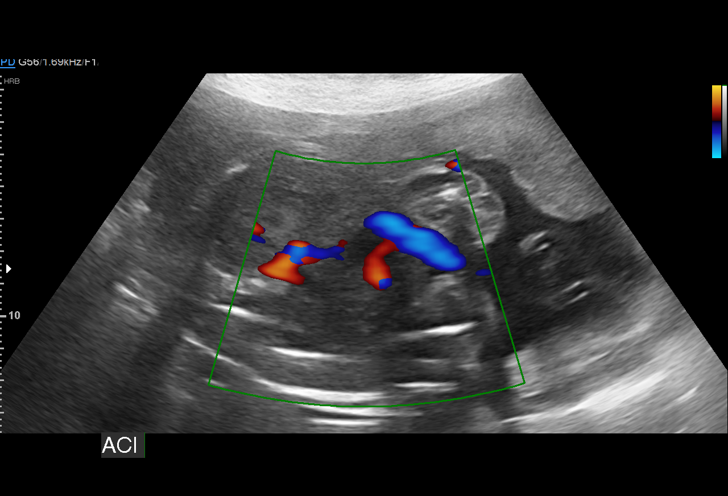
[im 34/57]
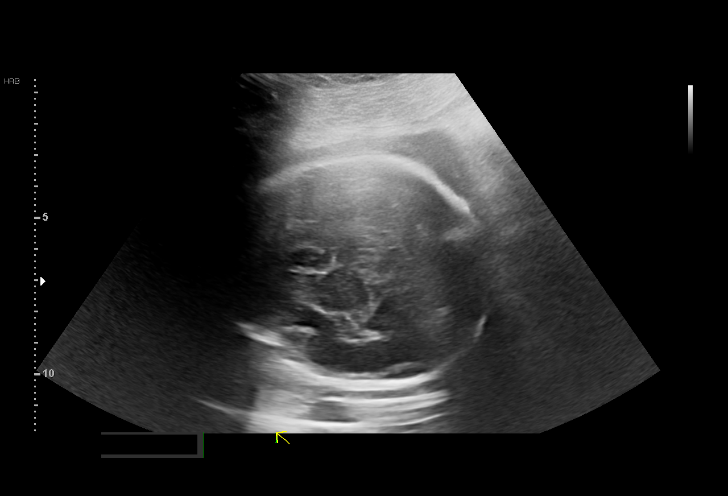
[im 38/57]
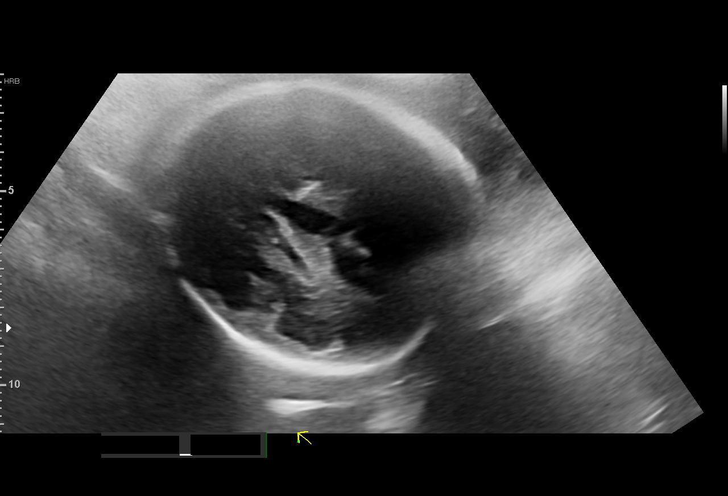
[im 42/57]
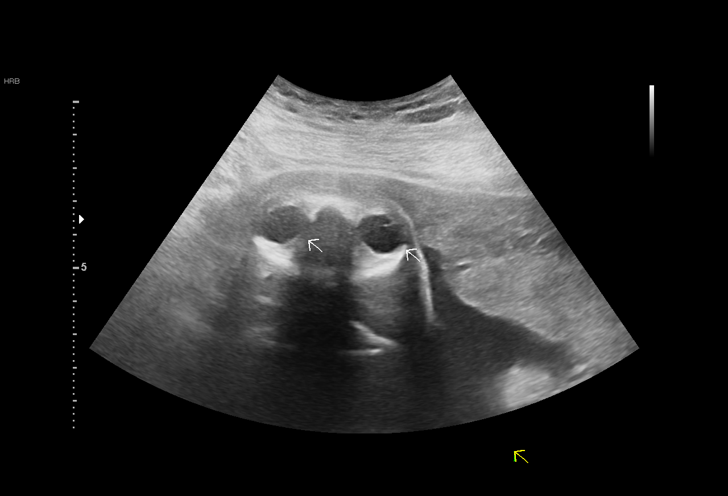
[im 46/57]
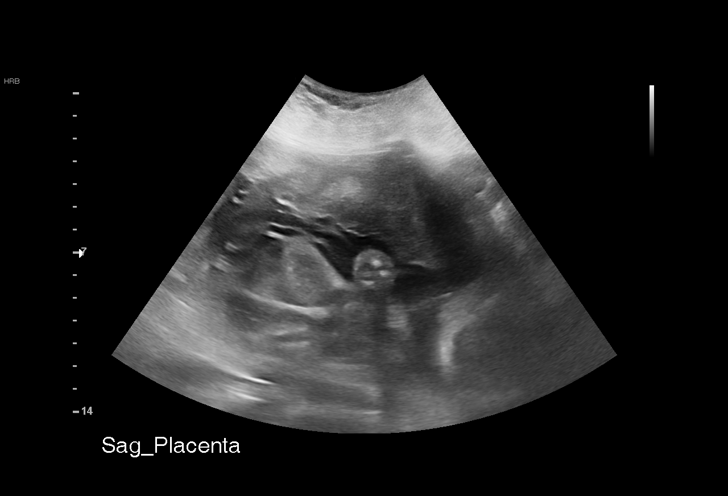
[im 50/57]
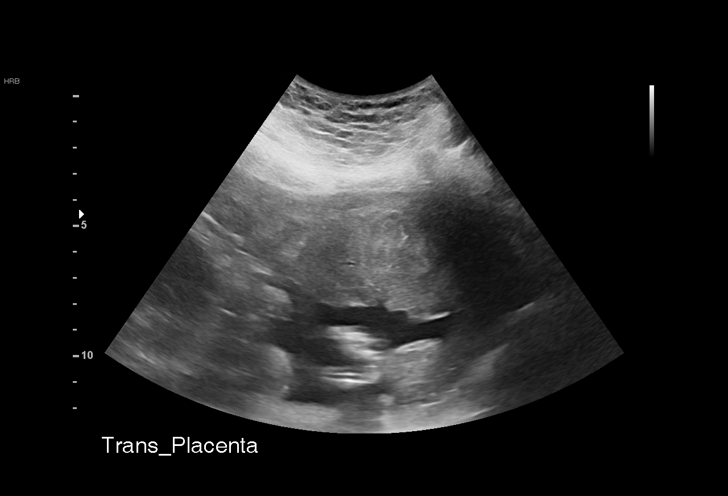
[im 54/57]
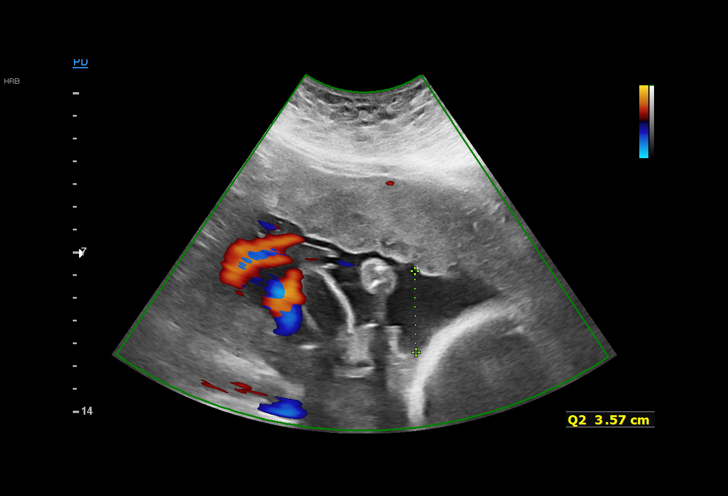

[13 of 28 positions shown; findings below may reference images not displayed]

OB/GYN &
                                                            [facility]

                                                       PADNOSKA
     STRESS                                            PADNOSKA
 ----------------------------------------------------------------------

 ----------------------------------------------------------------------
Indications

  History of cesarean delivery, currently
  pregnant
  Poor obstetric history: Previous preterm
  delivery, antepartum @88w8d - CS due to
  Preeclampsia
  Encounter for antenatal screening for
  malformations
  Severe preeclampsia, third trimester
  28 weeks gestation of pregnancy
 ----------------------------------------------------------------------
Vital Signs

 BMI:
Fetal Evaluation

 Num Of Fetuses:         1
 Fetal Heart Rate(bpm):  146
 Cardiac Activity:       Observed
 Presentation:           Cephalic
 Placenta:               Anterior
 P. Cord Insertion:      Visualized, central
 Amniotic Fluid
 AFI FV:      Within normal limits

 AFI Sum(cm)     %Tile       Largest Pocket(cm)
 12.68           33

 RUQ(cm)       RLQ(cm)       LUQ(cm)        LLQ(cm)

Biophysical Evaluation

 Amniotic F.V:   Within normal limits       F. Tone:        Observed
 F. Movement:    Observed                   Score:          [DATE]
 F. Breathing:   Observed
Biometry

 BPD:      71.5  mm     G. Age:  28w 5d         62  %    CI:        79.59   %    70 - 86
                                                         FL/HC:      19.8   %    18.8 -
 HC:      253.3  mm     G. Age:  27w 4d         10  %    HC/AC:      1.08        1.05 -
 AC:      235.5  mm     G. Age:  27w 6d         39  %    FL/BPD:     70.2   %    71 - 87
 FL:       50.2  mm     G. Age:  27w 0d         12  %    FL/AC:      21.3   %    20 - 24
 HUM:      45.5  mm     G. Age:  26w 6d         22  %

 Est. FW:    9965  gm      2 lb 7 oz     42  %
OB History

 Gravidity:    3         Prem:   1         SAB:   1
 Living:       1
Gestational Age

 LMP:           28w 0d        Date:  12/24/17                 EDD:   09/30/18
 U/S Today:     27w 6d                                        EDD:   10/01/18
 Best:          28w 0d     Det. By:  LMP  (12/24/17)          EDD:   09/30/18
Anatomy

 Cranium:               Appears normal         Aortic Arch:            Appears normal
 Cavum:                 Appears normal         Ductal Arch:            Appears normal
 Ventricles:            Not well visualized    Diaphragm:              Appears normal
 Choroid Plexus:        Appears normal         Stomach:                Appears normal, left
                                                                       sided
 Cerebellum:            Appears normal         Abdomen:                Appears normal
 Posterior Fossa:       Not well visualized    Abdominal Wall:         Appears nml (cord
                                                                       insert, abd wall)
 Nuchal Fold:           Not well visualized    Cord Vessels:           Appears normal (3
                                                                       vessel cord)
 Face:                  Orbits appear          Kidneys:                Appear normal
                        normal
 Lips:                  Appears normal         Bladder:                Appears normal
 Thoracic:              Appears normal         Spine:                  Not well visualized
 Heart:                 Appears normal         Upper Extremities:      Appears normal
                        (4CH, axis, and
                        situs)
 RVOT:                  Appears normal         Lower Extremities:      Appears normal
 LVOT:                  Appears normal

 Other:  Technically difficult due to maternal habitus and fetal position.
Cervix Uterus Adnexa

 Cervix
 Not visualized (advanced GA >55wks)

 Uterus
 No abnormality visualized.

 Left Ovary
 Not visualized.

 Right Ovary
 Not visualized.

 Cul De Sac
 No free fluid seen.

 Adnexa
 No abnormality visualized.
Impression

 On ultrasound, fetal growth is appropriate for gestational age.
 Amniotic fluid is normal and good fetal activity is seen.
 Cephalic presentation. Fetal anatomy appears normal, but
 limited by advanced gestational age. Antenatal testing is
 reassuring. BPP [DATE].
 Placenta is anterior and there is no evidence of previa or
 accreta.

 See consultation note in [REDACTED].
Recommendations

 -Twice-weekly BPP ([place]).
                 Struck, Dejuan

## 2020-10-09 IMAGING — US US ABDOMEN LIMITED
1 series · 15 of 25 positions shown · non-contrast
Comparison: None.

CLINICAL DATA: Right upper quadrant abdominal pain. Third trimester
pregnancy. Steady discontinued for stat C-section.

EXAM:
ULTRASOUND ABDOMEN LIMITED RIGHT UPPER QUADRANT

[Series 1: us abdomen limited · 15 of 31 slices shown]
[im 1/31]
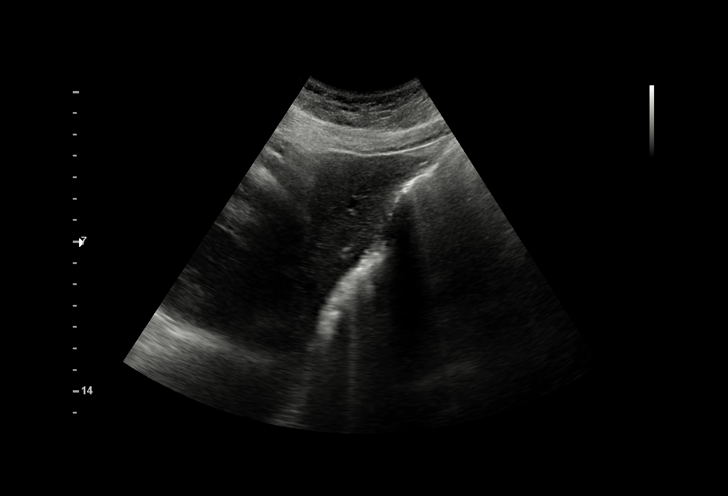
[im 3/31]
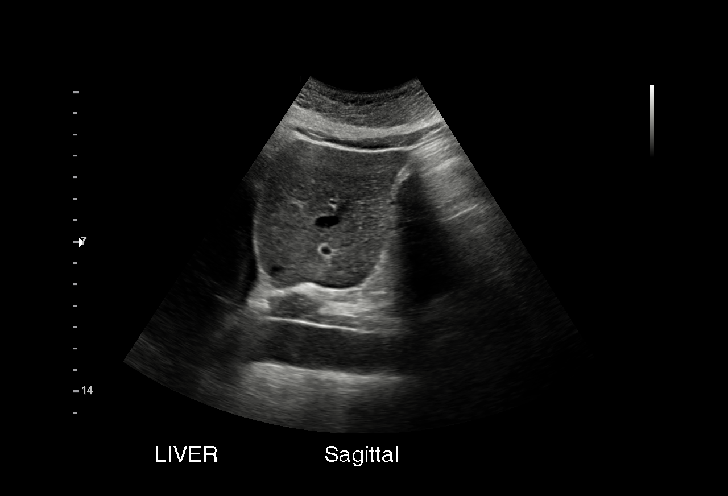
[im 6/31]
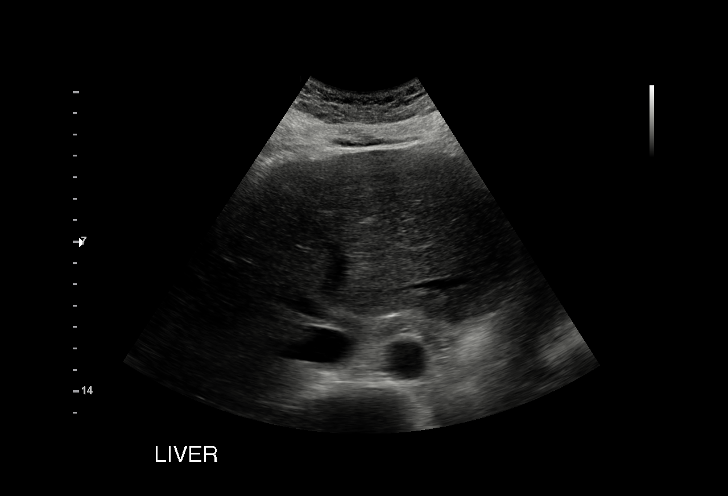
[im 7/31]
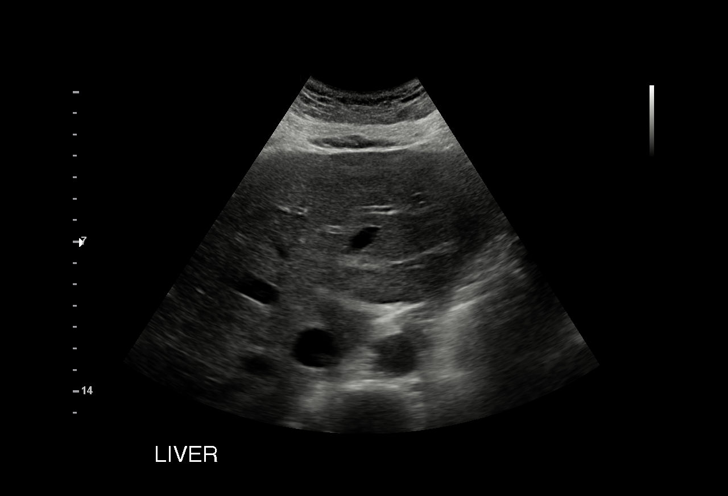
[im 9/31]
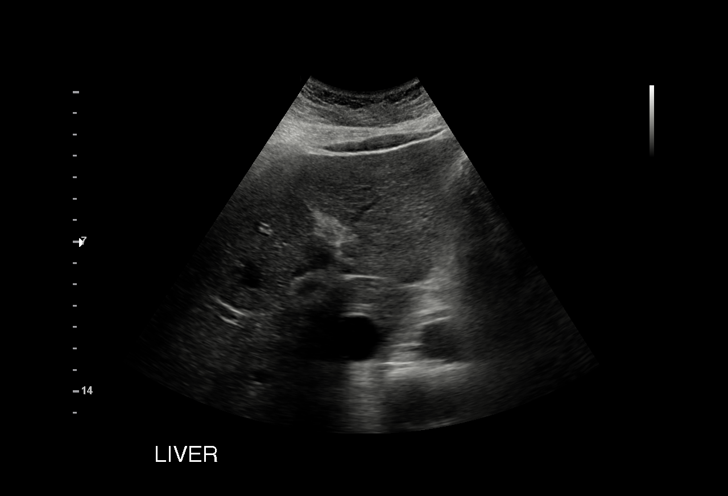
[im 12/31]
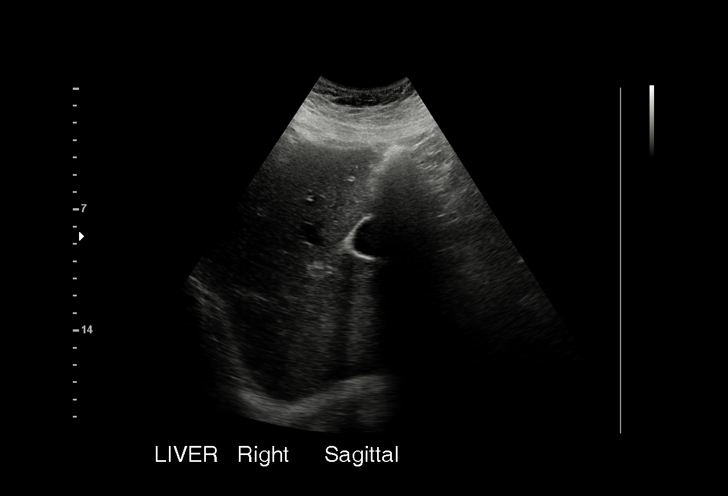
[im 13/31]
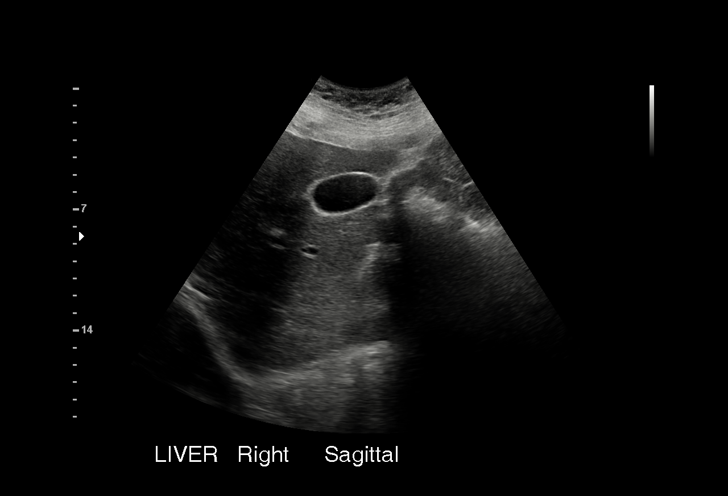
[im 16/31]
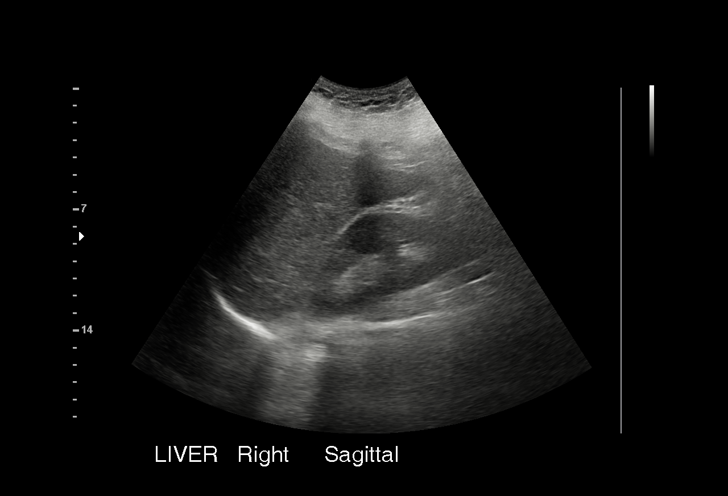
[im 18/31]
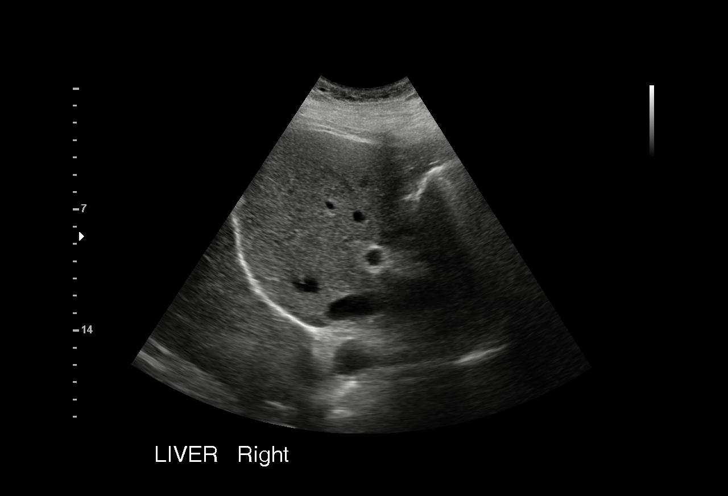
[im 19/31]
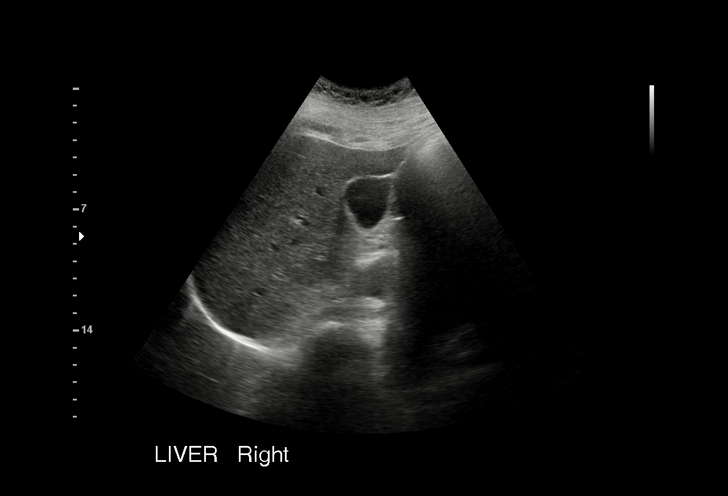
[im 22/31]
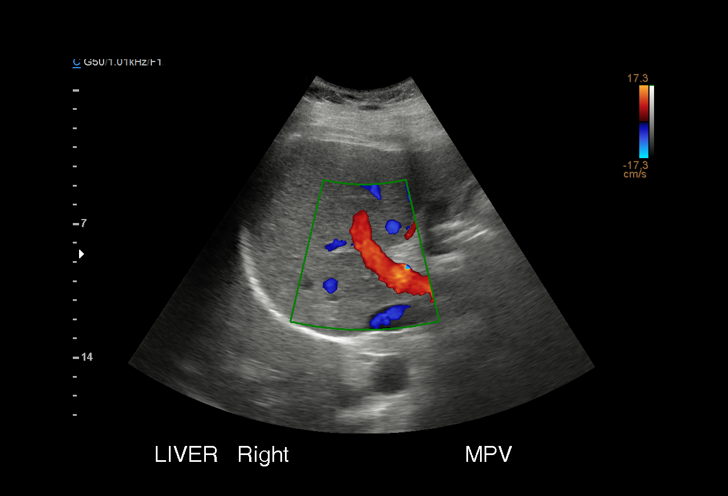
[im 24/31]
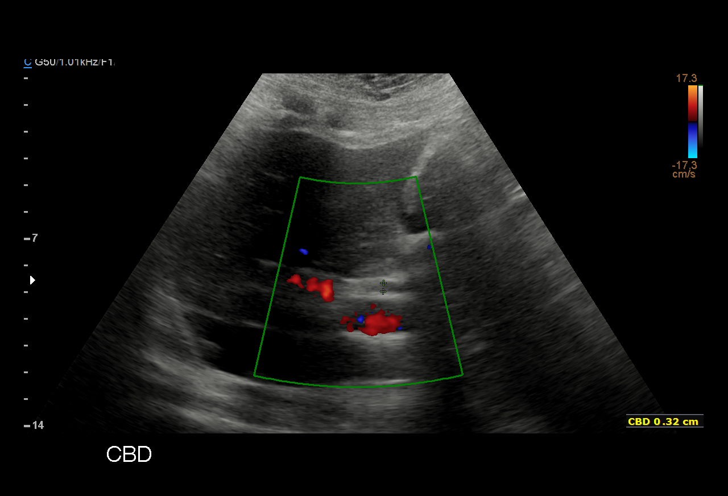
[im 26/31]
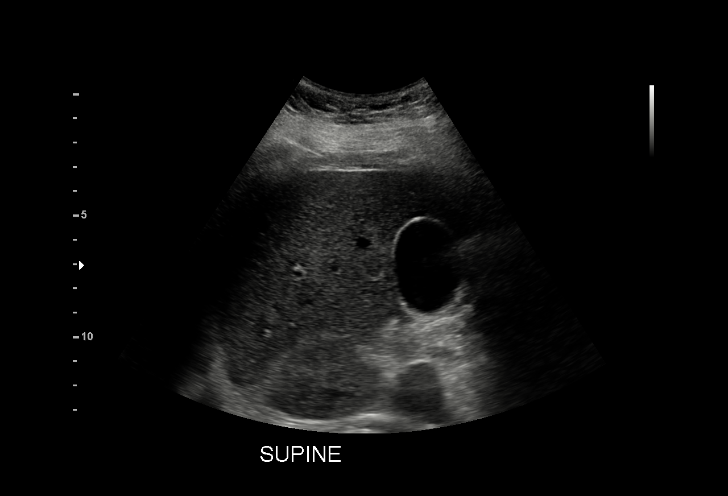
[im 28/31]
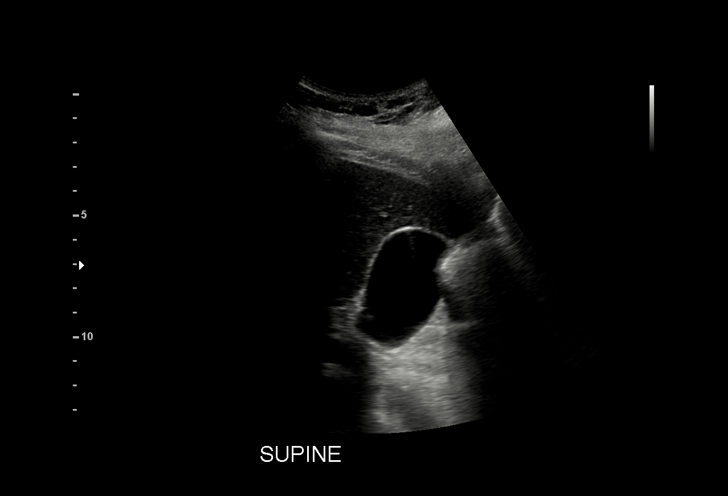
[im 31/31]
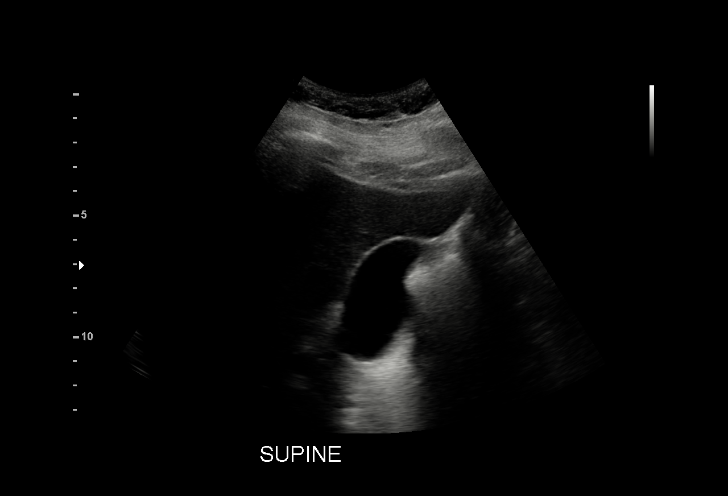

[15 of 25 positions shown; findings below may reference images not displayed]

FINDINGS: Gallbladder:

No gallstones or wall thickening visualized. No sonographic Murphy
sign noted by sonographer.

Common bile duct:

Diameter: 3 mm, within normal limits

Liver:

No focal lesion identified. Within normal limits in parenchymal
echogenicity. Portal vein is patent on color Doppler imaging with
normal direction of blood flow towards the liver.
IMPRESSION: Negative right upper quadrant ultrasound. No focal lesions of the
liver or evidence for hemorrhage.

## 2020-11-18 DIAGNOSIS — H9209 Otalgia, unspecified ear: Secondary | ICD-10-CM | POA: Diagnosis not present

## 2021-03-20 DIAGNOSIS — H9201 Otalgia, right ear: Secondary | ICD-10-CM | POA: Diagnosis not present

## 2021-03-20 DIAGNOSIS — J069 Acute upper respiratory infection, unspecified: Secondary | ICD-10-CM | POA: Diagnosis not present

## 2021-06-14 DIAGNOSIS — J4520 Mild intermittent asthma, uncomplicated: Secondary | ICD-10-CM | POA: Diagnosis not present

## 2021-08-02 DIAGNOSIS — M545 Low back pain, unspecified: Secondary | ICD-10-CM | POA: Diagnosis not present

## 2021-10-12 DIAGNOSIS — J0100 Acute maxillary sinusitis, unspecified: Secondary | ICD-10-CM | POA: Diagnosis not present

## 2021-11-06 DIAGNOSIS — N390 Urinary tract infection, site not specified: Secondary | ICD-10-CM | POA: Diagnosis not present

## 2022-05-19 DIAGNOSIS — I2699 Other pulmonary embolism without acute cor pulmonale: Secondary | ICD-10-CM

## 2022-05-19 HISTORY — DX: Other pulmonary embolism without acute cor pulmonale: I26.99

## 2022-05-26 DIAGNOSIS — H6993 Unspecified Eustachian tube disorder, bilateral: Secondary | ICD-10-CM | POA: Diagnosis not present

## 2022-05-26 DIAGNOSIS — J101 Influenza due to other identified influenza virus with other respiratory manifestations: Secondary | ICD-10-CM | POA: Diagnosis not present

## 2022-05-26 DIAGNOSIS — R509 Fever, unspecified: Secondary | ICD-10-CM | POA: Diagnosis not present

## 2022-06-18 DIAGNOSIS — N390 Urinary tract infection, site not specified: Secondary | ICD-10-CM | POA: Diagnosis not present

## 2022-07-01 DIAGNOSIS — N390 Urinary tract infection, site not specified: Secondary | ICD-10-CM | POA: Diagnosis not present

## 2022-09-02 ENCOUNTER — Encounter (HOSPITAL_BASED_OUTPATIENT_CLINIC_OR_DEPARTMENT_OTHER): Payer: Self-pay | Admitting: Advanced Practice Midwife

## 2022-09-02 ENCOUNTER — Ambulatory Visit (INDEPENDENT_AMBULATORY_CARE_PROVIDER_SITE_OTHER): Payer: BC Managed Care – PPO | Admitting: Advanced Practice Midwife

## 2022-09-02 ENCOUNTER — Other Ambulatory Visit (HOSPITAL_COMMUNITY)
Admission: RE | Admit: 2022-09-02 | Discharge: 2022-09-02 | Disposition: A | Discharge status: Home / Self Care | Payer: BC Managed Care – PPO | Source: Ambulatory Visit | Attending: Advanced Practice Midwife | Admitting: Advanced Practice Midwife

## 2022-09-02 VITALS — BP 122/71 | HR 81 | Ht 62.0 in | Wt 233.4 lb

## 2022-09-02 DIAGNOSIS — Z124 Encounter for screening for malignant neoplasm of cervix: Secondary | ICD-10-CM | POA: Diagnosis not present

## 2022-09-02 DIAGNOSIS — N809 Endometriosis, unspecified: Secondary | ICD-10-CM | POA: Insufficient documentation

## 2022-09-02 DIAGNOSIS — N920 Excessive and frequent menstruation with regular cycle: Secondary | ICD-10-CM

## 2022-09-02 DIAGNOSIS — N939 Abnormal uterine and vaginal bleeding, unspecified: Secondary | ICD-10-CM | POA: Diagnosis not present

## 2022-09-02 DIAGNOSIS — N946 Dysmenorrhea, unspecified: Secondary | ICD-10-CM | POA: Diagnosis not present

## 2022-09-02 DIAGNOSIS — Z01419 Encounter for gynecological examination (general) (routine) without abnormal findings: Secondary | ICD-10-CM

## 2022-09-02 MED ORDER — MYFEMBREE 40-1-0.5 MG PO TABS: ORAL_TABLET | ORAL | 5 refills | Status: DC

## 2022-09-02 NOTE — Progress Notes (Signed)
Subjective:     Chloe Graves is a 32 y.o. female here at [facility] for a routine exam.  Current complaints: painful heavy periods, dx of endometriosis, laparoscopic surgery never performed, was planned but pt became pregnant 4 years ago.  Bleeding is heavy but pt can manage this but pain is intolerable each month.  Personal health questionnaire reviewed: yes.  Do you have a primary care provider? yes Do you feel safe at home? yes  [employer] Visit from 09/02/2022 in [facility] at [facility] Total Score 0       Health Maintenance Due  Topic Date Due   COVID-19 Vaccine (1) Never done   Hepatitis C Screening  Never done   PAP SMEAR-Modifier  08/23/2019     Risk factors for chronic health problems: Smoking: Alchohol/how much: Pt BMI: Body mass index is 42.69 kg/m.   Gynecologic History Patient's last menstrual period was 08/27/2022. Contraception: tubal ligation Last Pap: 2018. Results were: normal Last mammogram: n/a.   Obstetric History OB History  Gravida Para Term Preterm AB Living  3 2 0 SAB IAB Ectopic Multiple Live Births  0 0 0 0 2    # Outcome Date GA Lbr Len/2nd Weight Sex Delivery Anes PTL Lv  3 Preterm 07/14/18 [redacted]w[redacted]d  2 lb 1.9 oz (0.96 kg) F CS-LTranv Gen  LIV  2 Preterm 11/16/11 [redacted]w[redacted]d  1 lb 6.9 oz (0.65 kg) M CS-LTranv Spinal  LIV     Birth Comments: preterm 27 wk SGA  1 AB 04/2011    U    DEC     Birth Comments: 04/26/11 no period after     The following portions of the patient's history were reviewed and updated as appropriate: allergies, current medications, past family history, past medical history, past social history, past surgical history, and problem list.  Review of Systems Pertinent items noted in HPI and remainder of comprehensive ROS otherwise negative.    Objective:  BP 122/71 (BP Location: Right Arm, Patient Position: Sitting, Cuff Size: Large)   Pulse 81   Ht 5'  2" (1.575 m) Comment: Reported  Wt 233 lb 6.4 oz (105.9 kg)   LMP 08/27/2022   BMI 42.69 kg/m   VS reviewed, nursing note reviewed,  Constitutional: well developed, well nourished, no distress HEENT: normocephalic, thyroid without enlargement or mass HEART: RRR, no murmurs rubs/gallops RESP: clear and equal to auscultation bilaterally in all lobes  Breast Exam: exam performed: right breast normal without mass, skin or nipple changes or axillary nodes, left breast normal without mass, skin or nipple changes or axillary nodes Abdomen: soft Neuro: alert and oriented x 3 Skin: warm, dry Psych: affect normal Pelvic exam: Performed: Cervix pink, visually closed, without lesion, scant white creamy discharge, vaginal walls and external genitalia normal Bimanual exam: Cervix 0/long/high, firm, anterior, neg CMT, uterus nontender, nonenlarged, adnexa without tenderness, enlargement, or mass        Assessment/Plan:   1. Cervical cancer screening  - Cytology - PAP( [place])  2. Women's annual routine gynecological examination    3. Menorrhagia with regular cycle --Heavier menses since BTL   4. Dysmenorrhea --Pt can manage heavy menses, but pain is worsening, is debilitating for 3-4 days each month. --Discussed NSAIDs around the clock during menses --See below for endometriosis mgmt --F/U for surgical consult with Dr Hyacinth Meeker    5. Abnormal uterine bleeding (AUB) --Pt reports bleeding out  of her naval, and pain at [employer], with each menses.  ? Related to endometriosis and postop complication after C/S and BTL?    6. Endometriosis --Pt to try MyFembree, f/u with Dr Hyacinth Meeker  - Relugolix-Estradiol-Norethind (MYFEMBREE) 40-1-0.5 MG TABS; Take one tablet daily with food.  Dispense: 28 tablet; Refill: 5    Return for Surgical consult with Dr Hyacinth Meeker , MD only.   Sharen Counter, CNM 6:26 PM

## 2022-09-04 LAB — CYTOLOGY - PAP
Comment: NEGATIVE
Diagnosis: NEGATIVE
High risk HPV: NEGATIVE

## 2022-09-08 ENCOUNTER — Encounter (HOSPITAL_BASED_OUTPATIENT_CLINIC_OR_DEPARTMENT_OTHER): Payer: Self-pay | Admitting: Advanced Practice Midwife

## 2022-09-08 NOTE — Telephone Encounter (Signed)
Spoke with patient in regards to MyFembree. I am currently needing to list tried and failed medications per the insurance company. Patient states that she is aware that the prior-authorization will not go through. She would like to try a generic or something equivalent to MyFembree. Per Misty Stanley there is no generic. She has an appointment with Hyacinth Meeker soon and should discuss these option with her. tbw

## 2022-09-10 ENCOUNTER — Other Ambulatory Visit (HOSPITAL_BASED_OUTPATIENT_CLINIC_OR_DEPARTMENT_OTHER): Payer: Self-pay | Admitting: *Deleted

## 2022-09-10 MED ORDER — NORETHINDRONE ACETATE 5 MG PO TABS: 5.0000 mg | ORAL_TABLET | Freq: Every day | ORAL | 0 refills | Status: DC

## 2022-09-19 ENCOUNTER — Ambulatory Visit (INDEPENDENT_AMBULATORY_CARE_PROVIDER_SITE_OTHER): Payer: BC Managed Care – PPO | Admitting: Obstetrics & Gynecology

## 2022-09-19 ENCOUNTER — Encounter (HOSPITAL_BASED_OUTPATIENT_CLINIC_OR_DEPARTMENT_OTHER): Payer: Self-pay | Admitting: Obstetrics & Gynecology

## 2022-09-19 VITALS — BP 110/73 | HR 81 | Ht 62.0 in | Wt 232.0 lb

## 2022-09-19 DIAGNOSIS — Z98891 History of uterine scar from previous surgery: Secondary | ICD-10-CM

## 2022-09-19 DIAGNOSIS — N920 Excessive and frequent menstruation with regular cycle: Secondary | ICD-10-CM

## 2022-09-19 DIAGNOSIS — N921 Excessive and frequent menstruation with irregular cycle: Secondary | ICD-10-CM | POA: Diagnosis not present

## 2022-09-19 DIAGNOSIS — Z862 Personal history of diseases of the blood and blood-forming organs and certain disorders involving the immune mechanism: Secondary | ICD-10-CM | POA: Diagnosis not present

## 2022-09-19 DIAGNOSIS — R1033 Periumbilical pain: Secondary | ICD-10-CM

## 2022-09-19 DIAGNOSIS — N80C2 Endometriosis of the umbilicus: Secondary | ICD-10-CM

## 2022-09-19 NOTE — Progress Notes (Unsigned)
GYNECOLOGY  VISIT  CC:   discuss treatment options  HPI: 32 y.o. G3P0212 Married [place] or [place] American female here for discussed menstrual bleeding.  Cycles are typically regular but she's has had some ones that do comes later.  Bleeding typically last 5-7 days.  First 3-4 days are the heavy ones with tampons, period underwear and overnight pads.  Passes lots of clots and with lots of cramping.  She has been having issues since tubal ligation in 2020 with bleeding that occurs with her menstrual cycles.  There is always associated pain with this.  She was prescribed myfembree but insurance denied it.  She started oral progesterone.  She has tried a mirena IUD in the past.  The first one was malpositioned.  She second one didn't really help.  She tried seaonique and hated this.    Lab hb was 11.1 but last checked in 2020.   Past Medical History:  Diagnosis Date   H/O candidiasis    H/O cystitis    H/O rubella    H/O varicella    History of pregnancy induced hypertension    IDA (iron deficiency anemia)     MEDS:   Current Outpatient Medications on File Prior to Visit  Medication Sig Dispense Refill   norethindrone (AYGESTIN) 5 MG tablet Take 1 tablet (5 mg total) by mouth daily. 30 tablet 0   No current facility-administered medications on file prior to visit.    ALLERGIES: Latex  SH:  married, non smoker  Review of Systems  Constitutional: Negative.   Genitourinary: Negative.   Musculoskeletal:  Negative for neck pain.    PHYSICAL EXAMINATION:    BP 110/73 (BP Location: Right Arm, Patient Position: Sitting, Cuff Size: Large)   Pulse 81   Ht 5\' 2"  (1.575 m)   Wt 232 lb (105.2 kg)   LMP 08/27/2022   SpO2 100%   BMI 42.43 kg/m     General appearance: alert, cooperative and appears stated age Neck: no adenopathy, supple, symmetrical, trachea midline and thyroid normal to inspection and palpation CV:  {Exam; heart brief:31539} Lungs:  {pe lungs ob:314451} Breasts:  {Exam; breast:13139::"normal appearance, no masses or tenderness"} Abdomen: soft, non-tender; bowel sounds normal; no masses,  no organomegaly Lymph:  no inguinal LAD noted  Pelvic: External genitalia:  no lesions              Urethra:  normal appearing urethra with no masses, tenderness or lesions              Bartholins and Skenes: normal                 Vagina: normal appearing vagina with normal color and discharge, no lesions              Cervix: {CHL AMB PHY EX CERVIX NORM DEFAULT:[phone]::"no lesions"}              Bimanual Exam:  Uterus:  {CHL AMB PHY EX UTERUS NORM [phone]::"normal size, contour, position, consistency, mobility, non-tender"}              Adnexa: {CHL AMB PHY EX ADNEXA NO MASS [phone]::"no mass, fullness, tenderness"}              Rectovaginal: {yes no:[id number]}.  Confirms.              Anus:  normal sphincter tone, no lesions  Chaperone, ***, CMA, was present for exam.  Assessment/Plan: There are no diagnoses linked to this encounter.

## 2022-09-20 LAB — CBC
Hematocrit: 35 % (ref 34.0–46.6)
Hemoglobin: 10.6 g/dL — ABNORMAL LOW (ref 11.1–15.9)
MCH: 23.5 pg — ABNORMAL LOW (ref 26.6–33.0)
MCHC: 30.3 g/dL — ABNORMAL LOW (ref 31.5–35.7)
MCV: 78 fL — ABNORMAL LOW (ref 79–97)
Platelets: 437 10*3/uL (ref 150–450)
RBC: 4.51 x10E6/uL (ref 3.77–5.28)
RDW: 18 % — ABNORMAL HIGH (ref 11.7–15.4)
WBC: 7.7 10*3/uL (ref 3.4–10.8)

## 2022-09-20 LAB — TSH: TSH: 2.15 u[IU]/mL (ref 0.450–4.500)

## 2022-09-20 LAB — IRON,TIBC AND FERRITIN PANEL
Ferritin: 8 ng/mL — ABNORMAL LOW (ref 15–150)
Iron Saturation: 4 % — CL (ref 15–55)
Iron: 17 ug/dL — ABNORMAL LOW (ref 27–159)
Total Iron Binding Capacity: 412 ug/dL (ref 250–450)
UIBC: 395 ug/dL (ref 131–425)

## 2022-09-21 ENCOUNTER — Encounter (HOSPITAL_BASED_OUTPATIENT_CLINIC_OR_DEPARTMENT_OTHER): Payer: Self-pay | Admitting: Obstetrics & Gynecology

## 2022-09-21 DIAGNOSIS — N80C2 Endometriosis of the umbilicus: Secondary | ICD-10-CM | POA: Insufficient documentation

## 2022-09-21 DIAGNOSIS — N921 Excessive and frequent menstruation with irregular cycle: Secondary | ICD-10-CM | POA: Insufficient documentation

## 2022-09-21 DIAGNOSIS — R1033 Periumbilical pain: Secondary | ICD-10-CM | POA: Insufficient documentation

## 2022-10-08 ENCOUNTER — Ambulatory Visit (INDEPENDENT_AMBULATORY_CARE_PROVIDER_SITE_OTHER): Payer: BC Managed Care – PPO

## 2022-10-08 ENCOUNTER — Encounter (HOSPITAL_BASED_OUTPATIENT_CLINIC_OR_DEPARTMENT_OTHER): Payer: Self-pay | Admitting: Obstetrics & Gynecology

## 2022-10-08 ENCOUNTER — Ambulatory Visit (INDEPENDENT_AMBULATORY_CARE_PROVIDER_SITE_OTHER): Payer: BC Managed Care – PPO | Admitting: Obstetrics & Gynecology

## 2022-10-08 VITALS — BP 108/49 | HR 91 | Ht 62.0 in | Wt 237.0 lb

## 2022-10-08 DIAGNOSIS — N921 Excessive and frequent menstruation with irregular cycle: Secondary | ICD-10-CM | POA: Diagnosis not present

## 2022-10-08 DIAGNOSIS — D250 Submucous leiomyoma of uterus: Secondary | ICD-10-CM

## 2022-10-08 DIAGNOSIS — D251 Intramural leiomyoma of uterus: Secondary | ICD-10-CM | POA: Diagnosis not present

## 2022-10-08 DIAGNOSIS — D259 Leiomyoma of uterus, unspecified: Secondary | ICD-10-CM

## 2022-10-08 DIAGNOSIS — R1909 Other intra-abdominal and pelvic swelling, mass and lump: Secondary | ICD-10-CM

## 2022-10-08 DIAGNOSIS — N80C2 Endometriosis of the umbilicus: Secondary | ICD-10-CM | POA: Diagnosis not present

## 2022-10-08 DIAGNOSIS — R198 Other specified symptoms and signs involving the digestive system and abdomen: Secondary | ICD-10-CM | POA: Diagnosis not present

## 2022-10-08 MED ORDER — NORETHINDRONE ACETATE 5 MG PO TABS: 5.0000 mg | ORAL_TABLET | Freq: Every day | ORAL | 3 refills | Status: DC

## 2022-10-14 DIAGNOSIS — R1909 Other intra-abdominal and pelvic swelling, mass and lump: Secondary | ICD-10-CM | POA: Insufficient documentation

## 2022-10-14 NOTE — Progress Notes (Signed)
GYNECOLOGY  VISIT  CC:   F/u after ultrasound  HPI: 32 y.o. G3P0212 Married Black or [place] American female here for discussion of ultrasound results done due to menorrhagia with irregular menstrual bleeding as well as bleeding that occurs from her umbilicus when she is on her menstrual cycle.    Ultrasound showed uterus measuring 9 x 6 x 6cm with intramural fibroids 2.3cm and 1.7cm.  the larger fibroid is displacing the endometrium anteriorly.  Treatment with hysteroscopy and possible resection, RFA treatment with Sonata and then possible resection as well as hysterectomy discussed.  Pt is more interested in conservative treatment.  Procedure reviewed, recovery time, typical improvement in bleeding all discussed.  As well, she has the umbilical bleeding that needs additional evaluation.  Will reach out to General Surgery to see if CT or MRI is better test for this pt.  It is very interesting that this all started after her laparoscopic tubal ligation in 36m2020.   Past Medical History:  Diagnosis Date   H/O rubella    H/O varicella    History of pregnancy induced hypertension    IDA (iron deficiency anemia)    Preeclampsia    h/o severe preeclampsia with pregnancies    MEDS:   No current outpatient medications on file prior to visit.   No current facility-administered medications on file prior to visit.    ALLERGIES: Latex  SH:  married, non smoker (former)  Review of Systems  Constitutional: Negative.   Genitourinary:        Irregular menstrual bleeding that is heavy    PHYSICAL EXAMINATION:    BP (!) 108/49   Pulse 91   Ht 5\' 2"  (1.575 m)   Wt 237 lb (107.5 kg)   LMP 08/27/2022 (Exact Date)   BMI 43.35 kg/m     General appearance: alert, cooperative and appears stated age  Abdomen: soft, non-tender; bowel sounds normal; no masses,  no organomegaly, acorn-sized nodularity deep and just to the right of umbilicus   Assessment/Plan: 1. Menorrhagia with irregular  cycle - starting progesterone therapy norethindrone 5mg  daily.  Rx to pharmacy  2. Endometriosis, umbilicus - unsure about this diagnosis.  Will reach out to general surgery regarding best imaging.  Update:  CT abdomen recommended.    3. Umbilical bleeding  4. Intramural and submucous leiomyoma of uterus - currently, pt is leaning towards RFA treatment of fibroids with possible resection

## 2022-10-19 ENCOUNTER — Other Ambulatory Visit: Payer: Self-pay

## 2022-10-19 ENCOUNTER — Emergency Department (HOSPITAL_COMMUNITY): Payer: BC Managed Care – PPO

## 2022-10-19 ENCOUNTER — Emergency Department (HOSPITAL_COMMUNITY)
Admission: EM | Admit: 2022-10-19 | Discharge: 2022-10-19 | Disposition: A | Discharge status: Home / Self Care | Payer: BC Managed Care – PPO | Attending: Emergency Medicine | Admitting: Emergency Medicine

## 2022-10-19 ENCOUNTER — Emergency Department (HOSPITAL_BASED_OUTPATIENT_CLINIC_OR_DEPARTMENT_OTHER)
Admission: RE | Admit: 2022-10-19 | Discharge: 2022-10-19 | Disposition: A | Discharge status: Home / Self Care | Payer: BC Managed Care – PPO | Source: Ambulatory Visit | Attending: Obstetrics & Gynecology | Admitting: Obstetrics & Gynecology

## 2022-10-19 ENCOUNTER — Encounter (HOSPITAL_COMMUNITY): Payer: Self-pay

## 2022-10-19 DIAGNOSIS — R198 Other specified symptoms and signs involving the digestive system and abdomen: Secondary | ICD-10-CM | POA: Insufficient documentation

## 2022-10-19 DIAGNOSIS — N80C2 Endometriosis of the umbilicus: Secondary | ICD-10-CM | POA: Insufficient documentation

## 2022-10-19 DIAGNOSIS — J9811 Atelectasis: Secondary | ICD-10-CM | POA: Diagnosis not present

## 2022-10-19 DIAGNOSIS — R1909 Other intra-abdominal and pelvic swelling, mass and lump: Secondary | ICD-10-CM

## 2022-10-19 DIAGNOSIS — R0789 Other chest pain: Secondary | ICD-10-CM | POA: Diagnosis not present

## 2022-10-19 DIAGNOSIS — I2693 Single subsegmental pulmonary embolism without acute cor pulmonale: Secondary | ICD-10-CM | POA: Diagnosis not present

## 2022-10-19 DIAGNOSIS — N281 Cyst of kidney, acquired: Secondary | ICD-10-CM | POA: Diagnosis not present

## 2022-10-19 DIAGNOSIS — R1033 Periumbilical pain: Secondary | ICD-10-CM | POA: Diagnosis not present

## 2022-10-19 DIAGNOSIS — I2699 Other pulmonary embolism without acute cor pulmonale: Secondary | ICD-10-CM | POA: Diagnosis not present

## 2022-10-19 LAB — CBC WITH DIFFERENTIAL/PLATELET
Abs Immature Granulocytes: 0 10*3/uL (ref 0.00–0.07)
Basophils Absolute: 0 10*3/uL (ref 0.0–0.1)
Basophils Relative: 0 %
Eosinophils Absolute: 0.3 10*3/uL (ref 0.0–0.5)
Eosinophils Relative: 3 %
HCT: 34.2 % — ABNORMAL LOW (ref 36.0–46.0)
Hemoglobin: 11 g/dL — ABNORMAL LOW (ref 12.0–15.0)
Lymphocytes Relative: 47 %
Lymphs Abs: 4.1 10*3/uL — ABNORMAL HIGH (ref 0.7–4.0)
MCH: 24.7 pg — ABNORMAL LOW (ref 26.0–34.0)
MCHC: 32.2 g/dL (ref 30.0–36.0)
MCV: 76.9 fL — ABNORMAL LOW (ref 80.0–100.0)
Monocytes Absolute: 0.2 10*3/uL (ref 0.1–1.0)
Monocytes Relative: 2 %
Neutro Abs: 4.2 10*3/uL (ref 1.7–7.7)
Neutrophils Relative %: 48 %
Platelets: 425 10*3/uL — ABNORMAL HIGH (ref 150–400)
RBC: 4.45 MIL/uL (ref 3.87–5.11)
RDW: 23.2 % — ABNORMAL HIGH (ref 11.5–15.5)
WBC: 8.7 10*3/uL (ref 4.0–10.5)
nRBC: 0 % (ref 0.0–0.2)
nRBC: 0 /100 WBC

## 2022-10-19 LAB — BASIC METABOLIC PANEL
Anion gap: 11 (ref 5–15)
BUN: 8 mg/dL (ref 6–20)
CO2: 20 mmol/L — ABNORMAL LOW (ref 22–32)
Calcium: 9.1 mg/dL (ref 8.9–10.3)
Chloride: 105 mmol/L (ref 98–111)
Creatinine, Ser: 0.92 mg/dL (ref 0.44–1.00)
GFR, Estimated: >60 mL/min (ref >60)
Glucose, Bld: 92 mg/dL (ref 70–99)
Potassium: 4.3 mmol/L (ref 3.5–5.1)
Sodium: 136 mmol/L (ref 135–145)

## 2022-10-19 LAB — TROPONIN I (HIGH SENSITIVITY): Troponin I (High Sensitivity): 5 ng/L (ref <18)

## 2022-10-19 LAB — BRAIN NATRIURETIC PEPTIDE: B Natriuretic Peptide: 170.8 pg/mL — ABNORMAL HIGH (ref 0.0–100.0)

## 2022-10-19 MED ORDER — RIVAROXABAN 15 MG PO TABS
15.0000 mg | ORAL_TABLET | Freq: Once | ORAL | Status: AC
  Administered 2022-10-19: 15 mg via ORAL
  Filled 2022-10-19: qty 1

## 2022-10-19 MED ORDER — IOHEXOL 300 MG/ML  SOLN
100.0000 mL | Freq: Once | INTRAMUSCULAR | Status: AC | PRN
  Administered 2022-10-19: 80 mL via INTRAVENOUS

## 2022-10-19 MED ORDER — RIVAROXABAN (XARELTO) VTE STARTER PACK (15 & 20 MG): 1.0000 | ORAL_TABLET | Freq: Once | ORAL | 0 refills | Status: DC

## 2022-10-19 MED ORDER — IOHEXOL 350 MG/ML SOLN
75.0000 mL | Freq: Once | INTRAVENOUS | Status: AC | PRN
  Administered 2022-10-19: 75 mL via INTRAVENOUS

## 2022-10-19 NOTE — ED Provider Notes (Signed)
[place] EMERGENCY DEPARTMENT AT [facility] Provider Note   CSN: [id number] Arrival date & time: 10/19/22  1524     History Chief Complaint  Patient presents with   Sent by Dr for CT    HPI Kalecia Ditmore is a 32 y.o. female presenting for incidental finding on a CT scan.  States that she had a screening CT scan of her abdomen pelvis today for bleeding from her umbilicus per her gynecologist. At the top of her slices they found a possible filling defect in her pulmonary arteries and recommended presentation for potential pulmonary embolism.  She denies fevers chills nausea vomiting syncope shortness of breath or chest pain.  Denies any recent travel, history of DVT, recent surgery, malignancy.  She does take progesterone.   Patient's recorded medical, surgical, social, medication list and allergies were reviewed in the Snapshot window as part of the initial history.   Review of Systems   Review of Systems  Constitutional:  Negative for chills and fever.  HENT:  Negative for ear pain and sore throat.   Eyes:  Negative for pain and visual disturbance.  Respiratory:  Negative for cough and shortness of breath.   Cardiovascular:  Negative for chest pain and palpitations.  Gastrointestinal:  Negative for abdominal pain and vomiting.  Genitourinary:  Negative for dysuria and hematuria.  Musculoskeletal:  Negative for arthralgias and back pain.  Skin:  Negative for color change and rash.  Neurological:  Negative for seizures and syncope.  All other systems reviewed and are negative.   Physical Exam Updated Vital Signs BP 125/70 (BP Location: Right Arm)   Pulse 70   Temp 98.1 F (36.7 C) (Oral)   Resp 18   LMP 08/27/2022 (Exact Date)   SpO2 100%  Physical Exam Vitals and nursing note reviewed.  Constitutional:      General: She is not in acute distress.    Appearance: She is well-developed.  HENT:     Head: Normocephalic and atraumatic.  Eyes:      Conjunctiva/sclera: Conjunctivae normal.  Cardiovascular:     Rate and Rhythm: Normal rate and regular rhythm.     Heart sounds: No murmur heard. Pulmonary:     Effort: Pulmonary effort is normal. No respiratory distress.     Breath sounds: Normal breath sounds.  Abdominal:     General: There is no distension.     Palpations: Abdomen is soft.     Tenderness: There is no abdominal tenderness. There is no right CVA tenderness or left CVA tenderness.  Musculoskeletal:        General: No swelling or tenderness. Normal range of motion.     Cervical back: Neck supple.  Skin:    General: Skin is warm and dry.  Neurological:     General: No focal deficit present.     Mental Status: She is alert and oriented to person, place, and time. Mental status is at baseline.     Cranial Nerves: No cranial nerve deficit.      ED Course/ Medical Decision Making/ A&P Clinical Course as of 10/19/22 2346  Sun Oct 19, 2022  1642 N2C [CC]  2344 Hemoglobin(!): 11.0 [CC]    Clinical Course User Index [CC] Glyn Ade, MD    Procedures Procedures   Medications Ordered in ED Medications  iohexol (OMNIPAQUE) 350 MG/ML injection 75 mL (75 mLs Intravenous Contrast Given 10/19/22 1924)  Rivaroxaban (XARELTO) tablet 15 mg (15 mg Oral Given 10/19/22 2019)  Medical Decision Making:    Lexee Boldon is a 32 y.o. female who presented to the ED today with abnormal outside finding detailed above.     Complete initial physical exam performed, notably the patient  was hemodynamically stable in no acute distress.      Reviewed and confirmed nursing documentation for past medical history, family history, social history.    Initial Assessment:   Patient presenting with a chief complaint of potential PE sent in for further care and management.  Initial Plan:  CTA PE study ordered EKG/troponin/BNP to assist with evaluation of severity of PE or alternative causes of her chest pain. Objective  evaluation as below reviewed with plan for close reassessment  Initial Study Results:   Laboratory  All laboratory results reviewed without evidence of clinically relevant pathology.      EKG EKG was reviewed independently. Rate, rhythm, axis, intervals all examined and without medically relevant abnormality. ST segments without concerns for elevations.    Radiology  All images reviewed independently. Agree with radiology report at this time.   CT Angio Chest PE W and/or Wo Contrast  Result Date: 10/19/2022 CLINICAL DATA:  High probability for PE EXAM: CT ANGIOGRAPHY CHEST WITH CONTRAST TECHNIQUE: Multidetector CT imaging of the chest was performed using the standard protocol during bolus administration of intravenous contrast. Multiplanar CT image reconstructions and MIPs were obtained to evaluate the vascular anatomy. RADIATION DOSE REDUCTION: This exam was performed according to the departmental dose-optimization program which includes automated exposure control, adjustment of the mA and/or kV according to patient size and/or use of iterative reconstruction technique. CONTRAST:  75mL OMNIPAQUE IOHEXOL 350 MG/ML SOLN COMPARISON:  CT abdomen 10/19/2022 FINDINGS: Cardiovascular: There is adequate opacification of the pulmonary arteries. There are segmental pulmonary emboli in the right middle lobe and right lower lobe. Heart and aorta are normal in size. There is an aberrant right subclavian artery, normal variant. No pericardial effusion. Mediastinum/Nodes: No enlarged mediastinal, hilar, or axillary lymph nodes. Thyroid gland, trachea, and esophagus demonstrate no significant findings. Lungs/Pleura: There is minimal dependent atelectasis bilaterally. The lungs are otherwise clear. No pleural effusion or pneumothorax. Upper Abdomen: No acute abnormality. Musculoskeletal: No chest wall abnormality. No acute or significant osseous findings. Review of the MIP images confirms the above findings.  IMPRESSION: Acute segmental pulmonary emboli in the right middle lobe and right lower lobe. No evidence for right heart strain. Electronically Signed   By: Darliss Cheney M.D.   On: 10/19/2022 19:31   CT ABDOMEN W CONTRAST  Result Date: 10/19/2022 CLINICAL DATA:  32 year old female with periumbilical pain and bleeding during her. For the past year and a half. EXAM: CT ABDOMEN WITH CONTRAST TECHNIQUE: Multidetector CT imaging of the abdomen was performed using the standard protocol following bolus administration of intravenous contrast. RADIATION DOSE REDUCTION: This exam was performed according to the departmental dose-optimization program which includes automated exposure control, adjustment of the mA and/or kV according to patient size and/or use of iterative reconstruction technique. CONTRAST:  80mL OMNIPAQUE IOHEXOL 300 MG/ML  SOLN COMPARISON:  No priors. FINDINGS: Lower chest: Possible filling defect in a segmental sized branch to the right lower lobe (axial image 4 of series 2), suspicious for incidental pulmonary embolism. Hepatobiliary: No suspicious cystic or solid hepatic lesions. No intra or extrahepatic biliary ductal dilatation. Gallbladder is unremarkable in appearance. Pancreas: No pancreatic mass. No pancreatic ductal dilatation. No pancreatic or peripancreatic fluid collections or inflammatory changes. Spleen: Unremarkable. Adrenals/Urinary Tract: Subcentimeter low-attenuation lesion in the  upper pole of the left kidney, too small to definitively characterize, but statistically likely a tiny cyst (no imaging follow-up recommended). Right kidney and bilateral adrenal glands are otherwise normal in appearance. No hydroureteronephrosis in the visualized portions of the abdomen. Stomach/Bowel: The appearance of the stomach is normal. No pathologic dilatation of visualized portions of small bowel or colon. Normal appendix. Vascular/Lymphatic: No significant atherosclerotic disease, aneurysm or  dissection noted in the visualized portions of the abdominal vasculature. No definite lymphadenopathy noted in the abdomen. Other: Enhancing soft tissue mass associated with the umbilicus (axial image 52 of series 2 and coronal image 94 of series 5) measuring approximately 2.7 x 2.0 x 2.4 cm. No significant volume of ascites and no pneumoperitoneum noted in the visualized portions of the peritoneal cavity. Musculoskeletal: There are no aggressive appearing lytic or blastic lesions noted in the visualized portions of the skeleton. IMPRESSION: 1. Possible filling defect in a segmental sized pulmonary artery branch to the right lower lobe. If there is clinical concern for potential pulmonary embolism, further evaluation with PE protocol CT scan should be considered at this time. 2. Enhancing soft tissue mass associated with the umbilicus suspicious for potential endometrioma given the patient's history of spontaneous bleeding during menses. Electronically Signed   By: Trudie Reed M.D.   On: 10/19/2022 09:48     Final Assessment and Plan:   CTA PE study diagnostic.  Informed patient of the finding.  Low PESI.  Stable for outpatient care management on Xarelto with follow-up with hematology.  Patient has been requesting discharge for multiple hours as soon as possible.  Given well appearance I believe this is reasonable.   Disposition:  I have considered need for hospitalization, however, considering all of the above, I believe this patient is stable for discharge at this time.  Patient/family educated about specific return precautions for given chief complaint and symptoms.  Patient/family educated about follow-up with PCP.     Patient/family expressed understanding of return precautions and need for follow-up. Patient spoken to regarding all imaging and laboratory results and appropriate follow up for these results. All education provided in verbal form with additional information in written form. Time  was allowed for answering of patient questions. Patient discharged.    Emergency Department Medication Summary:   Medications  iohexol (OMNIPAQUE) 350 MG/ML injection 75 mL (75 mLs Intravenous Contrast Given 10/19/22 1924)  Rivaroxaban (XARELTO) tablet 15 mg (15 mg Oral Given 10/19/22 2019)         Clinical Impression:  1. Single subsegmental pulmonary embolism without acute cor pulmonale [facility])      Discharge   Final Clinical Impression(s) / ED Diagnoses Final diagnoses:  Single subsegmental pulmonary embolism without acute cor pulmonale (HCC)    Rx / DC Orders ED Discharge Orders          Ordered    RIVAROXABAN (XARELTO) VTE STARTER PACK (15 & 20 MG)   Once        10/19/22 1944              Glyn Ade, MD 10/19/22 2346

## 2022-10-19 NOTE — ED Triage Notes (Addendum)
Pt can in via POV requesting follow-up CT scan sine the CT at [facility] this morning showed a possible blood clot in her lungs (per pt) & she was called & told to come here for further eval. Pt states she had the CT scan done d/t the fact her belly button had been bleeding some for the past year & she decided to get it checked out. A/Ox4, denies pain or SOB/difficulty breathing.

## 2022-10-19 NOTE — ED Notes (Signed)
Pt becoming increasingly more frustrated with ED staff about the wait time for CT. Pt becoming verbally aggressive RN aware.

## 2022-10-19 NOTE — Addendum Note (Signed)
Addended by: Jerene Bears on: 10/19/2022 02:03 PM   Modules accepted: Orders

## 2022-10-20 ENCOUNTER — Other Ambulatory Visit (HOSPITAL_BASED_OUTPATIENT_CLINIC_OR_DEPARTMENT_OTHER): Payer: Self-pay | Admitting: Obstetrics & Gynecology

## 2022-10-20 DIAGNOSIS — I2693 Single subsegmental pulmonary embolism without acute cor pulmonale: Secondary | ICD-10-CM

## 2022-10-30 ENCOUNTER — Inpatient Hospital Stay (HOSPITAL_BASED_OUTPATIENT_CLINIC_OR_DEPARTMENT_OTHER)
Admission: EM | Admit: 2022-10-30 | Discharge: 2022-11-01 | DRG: 760 | Disposition: A | Discharge status: Home / Self Care | Payer: BC Managed Care – PPO | Attending: Internal Medicine | Admitting: Internal Medicine

## 2022-10-30 ENCOUNTER — Encounter (HOSPITAL_BASED_OUTPATIENT_CLINIC_OR_DEPARTMENT_OTHER): Payer: Self-pay | Admitting: Obstetrics & Gynecology

## 2022-10-30 ENCOUNTER — Encounter (HOSPITAL_BASED_OUTPATIENT_CLINIC_OR_DEPARTMENT_OTHER): Payer: Self-pay

## 2022-10-30 ENCOUNTER — Emergency Department (HOSPITAL_BASED_OUTPATIENT_CLINIC_OR_DEPARTMENT_OTHER): Payer: BC Managed Care – PPO

## 2022-10-30 DIAGNOSIS — N7011 Chronic salpingitis: Secondary | ICD-10-CM | POA: Diagnosis present

## 2022-10-30 DIAGNOSIS — N9489 Other specified conditions associated with female genital organs and menstrual cycle: Secondary | ICD-10-CM | POA: Diagnosis present

## 2022-10-30 DIAGNOSIS — Z79899 Other long term (current) drug therapy: Secondary | ICD-10-CM | POA: Diagnosis not present

## 2022-10-30 DIAGNOSIS — D62 Acute posthemorrhagic anemia: Secondary | ICD-10-CM | POA: Diagnosis present

## 2022-10-30 DIAGNOSIS — Z86711 Personal history of pulmonary embolism: Secondary | ICD-10-CM | POA: Diagnosis not present

## 2022-10-30 DIAGNOSIS — N939 Abnormal uterine and vaginal bleeding, unspecified: Secondary | ICD-10-CM | POA: Diagnosis not present

## 2022-10-30 DIAGNOSIS — Y92009 Unspecified place in unspecified non-institutional (private) residence as the place of occurrence of the external cause: Secondary | ICD-10-CM

## 2022-10-30 DIAGNOSIS — N921 Excessive and frequent menstruation with irregular cycle: Secondary | ICD-10-CM | POA: Diagnosis not present

## 2022-10-30 DIAGNOSIS — T45515A Adverse effect of anticoagulants, initial encounter: Secondary | ICD-10-CM | POA: Diagnosis present

## 2022-10-30 DIAGNOSIS — E669 Obesity, unspecified: Secondary | ICD-10-CM | POA: Diagnosis not present

## 2022-10-30 DIAGNOSIS — I2699 Other pulmonary embolism without acute cor pulmonale: Secondary | ICD-10-CM | POA: Diagnosis not present

## 2022-10-30 DIAGNOSIS — D649 Anemia, unspecified: Secondary | ICD-10-CM | POA: Diagnosis not present

## 2022-10-30 DIAGNOSIS — E538 Deficiency of other specified B group vitamins: Secondary | ICD-10-CM | POA: Diagnosis present

## 2022-10-30 DIAGNOSIS — D6832 Hemorrhagic disorder due to extrinsic circulating anticoagulants: Secondary | ICD-10-CM | POA: Diagnosis present

## 2022-10-30 DIAGNOSIS — Z6841 Body Mass Index (BMI) 40.0 and over, adult: Secondary | ICD-10-CM

## 2022-10-30 DIAGNOSIS — Z87891 Personal history of nicotine dependence: Secondary | ICD-10-CM

## 2022-10-30 DIAGNOSIS — N80C2 Endometriosis of the umbilicus: Secondary | ICD-10-CM | POA: Diagnosis not present

## 2022-10-30 DIAGNOSIS — N809 Endometriosis, unspecified: Secondary | ICD-10-CM | POA: Diagnosis present

## 2022-10-30 DIAGNOSIS — M5440 Lumbago with sciatica, unspecified side: Secondary | ICD-10-CM | POA: Diagnosis present

## 2022-10-30 DIAGNOSIS — D500 Iron deficiency anemia secondary to blood loss (chronic): Secondary | ICD-10-CM | POA: Diagnosis present

## 2022-10-30 DIAGNOSIS — Z8249 Family history of ischemic heart disease and other diseases of the circulatory system: Secondary | ICD-10-CM

## 2022-10-30 DIAGNOSIS — R200 Anesthesia of skin: Secondary | ICD-10-CM | POA: Diagnosis present

## 2022-10-30 DIAGNOSIS — Z7901 Long term (current) use of anticoagulants: Secondary | ICD-10-CM | POA: Diagnosis not present

## 2022-10-30 DIAGNOSIS — Z91040 Latex allergy status: Secondary | ICD-10-CM | POA: Diagnosis not present

## 2022-10-30 DIAGNOSIS — N858 Other specified noninflammatory disorders of uterus: Secondary | ICD-10-CM | POA: Diagnosis not present

## 2022-10-30 DIAGNOSIS — Z86718 Personal history of other venous thrombosis and embolism: Secondary | ICD-10-CM

## 2022-10-30 LAB — CBC WITH DIFFERENTIAL/PLATELET
Abs Immature Granulocytes: 0.02 10*3/uL (ref 0.00–0.07)
Abs Immature Granulocytes: 0.03 10*3/uL (ref 0.00–0.07)
Basophils Absolute: 0 10*3/uL (ref 0.0–0.1)
Basophils Absolute: 0 10*3/uL (ref 0.0–0.1)
Basophils Relative: 1 %
Basophils Relative: 0 %
Eosinophils Absolute: 0.1 10*3/uL (ref 0.0–0.5)
Eosinophils Absolute: 0.1 10*3/uL (ref 0.0–0.5)
Eosinophils Relative: 1 %
Eosinophils Relative: 1 %
HCT: 19.7 % — ABNORMAL LOW (ref 36.0–46.0)
HCT: 18.8 % — ABNORMAL LOW (ref 36.0–46.0)
Hemoglobin: 6.4 g/dL — CL (ref 12.0–15.0)
Hemoglobin: 5.9 g/dL — CL (ref 12.0–15.0)
Immature Granulocytes: 0 %
Immature Granulocytes: 0 %
Lymphocytes Relative: 31 %
Lymphocytes Relative: 36 %
Lymphs Abs: 2.3 10*3/uL (ref 0.7–4.0)
Lymphs Abs: 3.5 10*3/uL (ref 0.7–4.0)
MCH: 24.2 pg — ABNORMAL LOW (ref 26.0–34.0)
MCH: 23.7 pg — ABNORMAL LOW (ref 26.0–34.0)
MCHC: 32.5 g/dL (ref 30.0–36.0)
MCHC: 31.4 g/dL (ref 30.0–36.0)
MCV: 74.6 fL — ABNORMAL LOW (ref 80.0–100.0)
MCV: 75.5 fL — ABNORMAL LOW (ref 80.0–100.0)
Monocytes Absolute: 0.3 10*3/uL (ref 0.1–1.0)
Monocytes Absolute: 0.4 10*3/uL (ref 0.1–1.0)
Monocytes Relative: 4 %
Monocytes Relative: 4 %
Neutro Abs: 4.6 10*3/uL (ref 1.7–7.7)
Neutro Abs: 5.6 10*3/uL (ref 1.7–7.7)
Neutrophils Relative %: 63 %
Neutrophils Relative %: 59 %
Platelets: 570 10*3/uL — ABNORMAL HIGH (ref 150–400)
Platelets: 551 10*3/uL — ABNORMAL HIGH (ref 150–400)
RBC: 2.64 MIL/uL — ABNORMAL LOW (ref 3.87–5.11)
RBC: 2.49 MIL/uL — ABNORMAL LOW (ref 3.87–5.11)
RDW: 21.8 % — ABNORMAL HIGH (ref 11.5–15.5)
RDW: 21.5 % — ABNORMAL HIGH (ref 11.5–15.5)
WBC: 7.4 10*3/uL (ref 4.0–10.5)
WBC: 9.6 10*3/uL (ref 4.0–10.5)
nRBC: 0 % (ref 0.0–0.2)
nRBC: 0 % (ref 0.0–0.2)

## 2022-10-30 LAB — TYPE AND SCREEN
ABO/RH(D): A POS
Unit division: 0

## 2022-10-30 LAB — CBC
HCT: 23.6 % — ABNORMAL LOW (ref 36.0–46.0)
Hemoglobin: 7.7 g/dL — ABNORMAL LOW (ref 12.0–15.0)
MCH: 25.8 pg — ABNORMAL LOW (ref 26.0–34.0)
MCHC: 32.6 g/dL (ref 30.0–36.0)
MCV: 78.9 fL — ABNORMAL LOW (ref 80.0–100.0)
Platelets: 564 10*3/uL — ABNORMAL HIGH (ref 150–400)
RBC: 2.99 MIL/uL — ABNORMAL LOW (ref 3.87–5.11)
RDW: 20.8 % — ABNORMAL HIGH (ref 11.5–15.5)
WBC: 10 10*3/uL (ref 4.0–10.5)
nRBC: 0 % (ref 0.0–0.2)

## 2022-10-30 LAB — URINALYSIS, W/ REFLEX TO CULTURE (INFECTION SUSPECTED)
Bacteria, UA: NONE SEEN
Bilirubin Urine: NEGATIVE
Glucose, UA: NEGATIVE mg/dL
Ketones, ur: NEGATIVE mg/dL
Leukocytes,Ua: NEGATIVE
Nitrite: NEGATIVE
Protein, ur: NEGATIVE mg/dL
Specific Gravity, Urine: <1.005 — ABNORMAL LOW (ref 1.005–1.030)
pH: 6 (ref 5.0–8.0)

## 2022-10-30 LAB — BPAM RBC
Blood Product Expiration Date: 202407012359
Blood Product Expiration Date: 202407022359
Unit Type and Rh: 6200

## 2022-10-30 LAB — COMPREHENSIVE METABOLIC PANEL
ALT: 15 U/L (ref 0–44)
AST: 15 U/L (ref 15–41)
Albumin: 4.1 g/dL (ref 3.5–5.0)
Alkaline Phosphatase: 30 U/L — ABNORMAL LOW (ref 38–126)
Anion gap: 7 (ref 5–15)
BUN: 8 mg/dL (ref 6–20)
CO2: 21 mmol/L — ABNORMAL LOW (ref 22–32)
Calcium: 8.8 mg/dL — ABNORMAL LOW (ref 8.9–10.3)
Chloride: 111 mmol/L (ref 98–111)
Creatinine, Ser: 0.83 mg/dL (ref 0.44–1.00)
GFR, Estimated: >60 mL/min (ref >60)
Glucose, Bld: 88 mg/dL (ref 70–99)
Potassium: 3.9 mmol/L (ref 3.5–5.1)
Sodium: 139 mmol/L (ref 135–145)
Total Bilirubin: 0.2 mg/dL — ABNORMAL LOW (ref 0.3–1.2)
Total Protein: 6.5 g/dL (ref 6.5–8.1)

## 2022-10-30 LAB — FOLATE: Folate: 12.2 ng/mL (ref >5.9)

## 2022-10-30 LAB — RETICULOCYTES
Immature Retic Fract: 23.4 % — ABNORMAL HIGH (ref 2.3–15.9)
RBC.: 2.44 MIL/uL — ABNORMAL LOW (ref 3.87–5.11)
Retic Count, Absolute: 62 10*3/uL (ref 19.0–186.0)
Retic Ct Pct: 2.5 % (ref 0.4–3.1)

## 2022-10-30 LAB — FERRITIN: Ferritin: 4 ng/mL — ABNORMAL LOW (ref 11–307)

## 2022-10-30 LAB — IRON AND TIBC
Iron: <5 ug/dL — ABNORMAL LOW (ref 28–170)
TIBC: 403 ug/dL (ref 250–450)

## 2022-10-30 LAB — PROTIME-INR
INR: 1.8 — ABNORMAL HIGH (ref 0.8–1.2)
Prothrombin Time: 20.9 seconds — ABNORMAL HIGH (ref 11.4–15.2)

## 2022-10-30 LAB — VITAMIN B12: Vitamin B-12: 136 pg/mL — ABNORMAL LOW (ref 180–914)

## 2022-10-30 LAB — PREPARE RBC (CROSSMATCH)

## 2022-10-30 LAB — PREGNANCY, URINE: Preg Test, Ur: NEGATIVE

## 2022-10-30 MED ORDER — MEGESTROL ACETATE 20 MG PO TABS: 20.0000 mg | ORAL_TABLET | Freq: Two times a day (BID) | ORAL | Status: DC

## 2022-10-30 MED ORDER — SODIUM CHLORIDE 0.9% IV SOLUTION: Freq: Once | INTRAVENOUS | Status: AC

## 2022-10-30 MED ORDER — ONDANSETRON HCL 4 MG PO TABS: 4.0000 mg | ORAL_TABLET | Freq: Four times a day (QID) | ORAL | Status: DC | PRN

## 2022-10-30 MED ORDER — MEGESTROL ACETATE 20 MG PO TABS
20.0000 mg | ORAL_TABLET | Freq: Once | ORAL | Status: DC
  Filled 2022-10-30: qty 1

## 2022-10-30 MED ORDER — SODIUM CHLORIDE 0.9 % IV BOLUS
1000.0000 mL | Freq: Once | INTRAVENOUS | Status: AC
  Administered 2022-10-30: 1000 mL via INTRAVENOUS

## 2022-10-30 MED ORDER — MEGESTROL ACETATE 20 MG PO TABS
20.0000 mg | ORAL_TABLET | Freq: Three times a day (TID) | ORAL | Status: DC
  Administered 2022-10-30 – 2022-11-01 (×5): 20 mg via ORAL
  Filled 2022-10-30 (×7): qty 1

## 2022-10-30 MED ORDER — ACETAMINOPHEN 325 MG PO TABS: 650.0000 mg | ORAL_TABLET | Freq: Four times a day (QID) | ORAL | Status: DC | PRN

## 2022-10-30 MED ORDER — SODIUM CHLORIDE 0.9 % IV SOLN
250.0000 mg | Freq: Every day | INTRAVENOUS | Status: AC
  Administered 2022-10-30 – 2022-10-31 (×2): 250 mg via INTRAVENOUS
  Filled 2022-10-30 (×2): qty 20

## 2022-10-30 MED ORDER — NORETHINDRONE ACETATE 5 MG PO TABS
10.0000 mg | ORAL_TABLET | Freq: Two times a day (BID) | ORAL | Status: DC
  Filled 2022-10-30: qty 2

## 2022-10-30 MED ORDER — B COMPLEX-C PO TABS
1.0000 | ORAL_TABLET | Freq: Every day | ORAL | Status: DC
  Administered 2022-10-30 – 2022-11-01 (×3): 1 via ORAL
  Filled 2022-10-30 (×3): qty 1

## 2022-10-30 MED ORDER — ONDANSETRON HCL 4 MG/2ML IJ SOLN: 4.0000 mg | Freq: Four times a day (QID) | INTRAMUSCULAR | Status: DC | PRN

## 2022-10-30 MED ORDER — MEGESTROL ACETATE 20 MG PO TABS: 20.0000 mg | ORAL_TABLET | Freq: Every day | ORAL | Status: DC

## 2022-10-30 MED ORDER — ACETAMINOPHEN 650 MG RE SUPP: 650.0000 mg | Freq: Four times a day (QID) | RECTAL | Status: DC | PRN

## 2022-10-30 NOTE — Progress Notes (Signed)
Plan of Care Note for accepted transfer   Patient: Chloe Graves MRN: [id number]   DOA: 10/30/2022  Facility requesting transfer: [facility] ED Requesting Provider: Lurena Nida, PA Reason for transfer: Vaginal bleeding Facility course: [facility]  Plan of care: The patient is accepted for admission to Telemetry unit, at [facility]..   Patient with recent diagnosis of PE on Eliquis started on June 2, and menorrhagia on progesterone, presented with heavy menstrual period bleeding for last 2+ weeks, and acute anemia, hemoglobin dropped to 6.4 compared to baseline> 11, feeling lightheaded and borderline hypotensive tachycardia.  Allergy Dr. Hyacinth Meeker contacted and recommended patient double dose her baseline progesterone dosage and transfer to [facility] for management.  Gynecology also mentioned about IR involvement if bleeding continue to be uncontrolled. Accepted to [facility] Tele admit. I have asked PA to do ED to ED.  Author: Emeline General, MD 10/30/2022  Check [email or web address] for on-call coverage.  Nursing staff, Please call [facility] Admits & Consults System-Wide number on Amion as soon as patient's arrival, so appropriate admitting provider can evaluate the pt.

## 2022-10-30 NOTE — ED Provider Notes (Signed)
[place] EMERGENCY DEPARTMENT AT [facility] Provider Note   CSN: [id number] Arrival date & time: 10/30/22  1029     History  Chief Complaint  Patient presents with   Vaginal Bleeding   Back Pain   Abdominal Pain    Chloe Graves is a 32 y.o. female.  Patient with history of newly diagnosed DVT/PE on xarelto presents today with complaints of vaginal bleeding. She states that she is on day 19 of her menstrual cycle and is having progressively worsening bleeding. She started her xarelto on 6/2 after her PE diagnosis. She also endorses pain throughout her abdomen that radiates to her back that started a few days ago and got worse today. She denies nausea, vomiting, or diarrhea. Does endorse significant lightheadedness especially while standing. States that she has gone through 2 boxes of 27 pads in the past week and is passing large clots. Has known history of fibroids with heavy menstrual cycles but has never had a cycle last this long before. She is on progesterone only birth control.  The history is provided by the patient. No language interpreter was used.  Vaginal Bleeding Associated symptoms: abdominal pain and back pain   Back Pain Associated symptoms: abdominal pain   Abdominal Pain Associated symptoms: vaginal bleeding        Home Medications Prior to Admission medications   Medication Sig Start Date End Date Taking? Authorizing Provider  norethindrone (AYGESTIN) 5 MG tablet Take 1 tablet (5 mg total) by mouth daily. 10/08/22   Jerene Bears, MD  RIVAROXABAN Carlena Hurl) VTE STARTER PACK (15 & 20 MG) Take 1 each by mouth once for 1 dose. Follow package directions: Take one 15mg  tablet by mouth twice a day. On day 22, switch to one 20mg  tablet once a day. Take with food. 10/19/22 10/19/22  Glyn Ade, MD      Allergies    Latex    Review of Systems   Review of Systems  Gastrointestinal:  Positive for abdominal pain.  Genitourinary:  Positive for  vaginal bleeding.  Musculoskeletal:  Positive for back pain.  All other systems reviewed and are negative.   Physical Exam Updated Vital Signs BP 121/78   Pulse 93   Temp 98.4 F (36.9 C)   Resp (!) 24   LMP 08/27/2022 (Exact Date)   SpO2 100%  Physical Exam Vitals and nursing note reviewed.  Constitutional:      General: She is not in acute distress.    Appearance: Normal appearance. She is normal weight. She is not ill-appearing, toxic-appearing or diaphoretic.  HENT:     Head: Normocephalic and atraumatic.  Cardiovascular:     Rate and Rhythm: Normal rate.  Pulmonary:     Effort: Pulmonary effort is normal. No respiratory distress.  Abdominal:     General: Abdomen is flat.     Palpations: Abdomen is soft.     Tenderness: There is generalized abdominal tenderness.  Musculoskeletal:        General: Normal range of motion.     Cervical back: Normal range of motion.  Skin:    General: Skin is warm and dry.  Neurological:     General: No focal deficit present.     Mental Status: She is alert.  Psychiatric:        Mood and Affect: Mood normal.        Behavior: Behavior normal.    ED Results / Procedures / Treatments   Labs (all labs ordered are  listed, but only abnormal results are displayed) Labs Reviewed  CBC WITH DIFFERENTIAL/PLATELET - Abnormal; Notable for the following components:      Result Value   RBC 2.64 (*)    Hemoglobin 6.4 (*)    HCT 19.7 (*)    MCV 74.6 (*)    MCH 24.2 (*)    RDW 21.8 (*)    Platelets 570 (*)    All other components within normal limits  COMPREHENSIVE METABOLIC PANEL - Abnormal; Notable for the following components:   CO2 21 (*)    Calcium 8.8 (*)    Alkaline Phosphatase 30 (*)    Total Bilirubin 0.2 (*)    All other components within normal limits  URINALYSIS, W/ REFLEX TO CULTURE (INFECTION SUSPECTED) - Abnormal; Notable for the following components:   Specific Gravity, Urine <1.005 (*)    Hgb urine dipstick LARGE (*)     All other components within normal limits  RETICULOCYTES - Abnormal; Notable for the following components:   RBC. 2.44 (*)    Immature Retic Fract 23.4 (*)    All other components within normal limits  PREGNANCY, URINE  VITAMIN B12  FOLATE  IRON AND TIBC  FERRITIN  HEMOGLOBIN AND HEMATOCRIT, BLOOD    EKG None  Radiology No results found.  Procedures .Critical Care  Performed by: Silva Bandy, PA-C Authorized by: Silva Bandy, PA-C   Critical care provider statement:    Critical care time (minutes):  30   Critical care start time:  10/30/2022 12:30 PM   Critical care end time:  10/30/2022 1:00 PM   Critical care was necessary to treat or prevent imminent or life-threatening deterioration of the following conditions:  Circulatory failure   Critical care was time spent personally by me on the following activities:  Development of treatment plan with patient or surrogate, discussions with consultants, discussions with primary provider, examination of patient, evaluation of patient's response to treatment, obtaining history from patient or surrogate, ordering and review of laboratory studies, ordering and review of radiographic studies, pulse oximetry, re-evaluation of patient's condition and review of old charts   Care discussed with: admitting provider       Medications Ordered in ED Medications  sodium chloride 0.9 % bolus 1,000 mL (1,000 mLs Intravenous New Bag/Given 10/30/22 1156)    ED Course/ Medical Decision Making/ A&P Clinical Course as of 10/30/22 1847  Thu Oct 30, 2022  1431 This is a 32 year old female presenting to the ED with complaint of vaginal bleeding, abnormal, ongoing for several days, setting of recent initiation of Xarelto for PE.  She was found to have a significant drop in her hemoglobin count, down to 6.6 today.  She is symptomatic and tachycardic with ambulation but at rest on the bed is asymptomatic with normal vital signs.  She was evaluated by her  gynecologist here in the ED, who agreed with plan for medical admission, transfusion, and Guynn consultation.  Further decisions will need to be made among her inpatient team regarding continuation of anticoagulation given her bleeding risk.  Patient is consented for blood transfusion, but she is stable for transfer to the main hospital for transfusion, given that we only have access to limited emergency products here [MT]    Clinical Course User Index [MT] Trifan, Kermit Balo, MD                             Medical Decision Making Amount  and/or Complexity of Data Reviewed Labs: ordered. Radiology: ordered.  Risk Decision regarding hospitalization.   This patient is a 32 y.o. female who presents to the ED for concern of vaginal bleeding, abdominal pain, this involves an extensive number of treatment options, and is a complaint that carries with it a high risk of complications and morbidity. The emergent differential diagnosis prior to evaluation includes, but is not limited to,  Abnormal uterine bleeding, threatened miscarriage, incomplete miscarriage, normal bleeding from an early trimester pregnancy, ectopic pregnancy, vaginal/cervical trauma, subchorionic hemorrhage/hematoma  This is not an exhaustive differential.   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 The pertinent findings include: generalized abdominal TTP  Lab Tests: I ordered, and personally interpreted labs.  The pertinent results include:  hgb 6.4 down from 11.0 11 days ago. Upreg negative   Imaging Studies: I ordered imaging studies including pelvic US. I independently visualized and interpreted imaging which showed   1. No explanation for right lower quadrant pain. 2. Diffuse uterine heterogeneity is nonspecific but can be seen with adenomyosis. 3. Probable left-sided hydrosalpinx. Pre and postcontrast pelvic MRI could  confirm.  I agree with the radiologist interpretation.   Medications: I ordered medication including fluids  for dehydration. Reevaluation of the patient after these medicines showed that the patient improved. I have reviewed the patients home medicines and have made adjustments as needed.  Consultations Obtained: I requested consultation with the patients OB/GYN Dr. Hyacinth Meeker who has seen the patient,  and discussed lab and imaging findings as well as pertinent plan - they recommend: increase patients progesterone from 5 mg daily to 10 mg BID. They will talk to IR to discuss possible ablation and see the patient in consultation. Recommend hospitalist admission for blood transfusion.   Disposition: After consideration of the diagnostic results and the patients response to treatment, I feel that patient requires admission for active vaginal bleeding and symptomatic anemia requiring blood transfusion given her hemoglobin of 6.  Unfortunately we do not have blood available here and patient will require ED to ED transfer while awaiting a bed.  Patient is understanding and amenable with this plan.  Discussed patient with hospitalist Dr. Chipper Herb who accepts patient for admission.  Discussed patient with [facility] Gainer, EDP Dr. Adela Lank who accepts patient for ED to ED transfer.   I discussed this case with my attending physician Dr. Renaye Rakers who cosigned this note including patient's presenting symptoms, physical exam, and planned diagnostics and interventions. Attending physician stated agreement with plan or made changes to plan which were implemented.     Final Clinical Impression(s) / ED Diagnoses Final diagnoses:  Vaginal bleeding  Symptomatic anemia    Rx / DC Orders ED Discharge Orders     None         Vear Clock 10/30/22 1847    Terald Sleeper, MD 10/31/22 [phone]

## 2022-10-30 NOTE — ED Notes (Signed)
Ambulated to restroom with RN, patient passed clots in toilet and reported feeling anxious and short of breath. Returned to stretcher and noted to have HR 145, RR 40, SpO2 100 RA. Respiratory to bedside. PA notified of change.

## 2022-10-30 NOTE — ED Notes (Signed)
PA notified of pt critical HGN- requesting pelvic cart be placed at bedside for exam.

## 2022-10-30 NOTE — ED Notes (Signed)
Hyacinth Meeker MD, OBGYN at bedside. EDP advised of same

## 2022-10-30 NOTE — ED Notes (Signed)
[facility] will be coming to get pt, tx to ED Chloe Graves; accepting: Melene Plan, DO

## 2022-10-30 NOTE — ED Triage Notes (Signed)
Pt reports being "on my cycle for 19 days- passing big clots." Hx PE 6/2, on xarelto for same "but I was on my period when I was diagnosed." States she is also having L arm numbness "like it's asleep but I can work it out, & deeper than a period cramp" pain in lower back, abdomen. Associated HA, "feeling off-center," lightheaded. Hx anemia

## 2022-10-30 NOTE — H&P (Signed)
History and Physical  Patient: Chloe Graves BJY:[id number] DOB: 10-14-1990 DOA: 10/30/2022 DOS: the patient was seen and examined on 10/30/2022 Patient coming from: Home  Chief Complaint: Prolonged menstrual cycle ED TRIAGE note : "Pt reports being "on my cycle for 19 days- passing big clots." Hx PE 6/2, on xarelto for same "but I was on my period when I was diagnosed." States she is also having L arm numbness "like it's asleep but I can work it out, & deeper than a period cramp" pain in lower back, abdomen. Associated HA, "feeling off-center," lightheaded. Hx anemia " HPI: Chloe Graves is a 32 y.o. female with PMH significant of menorrhagia, endometriosis, obesity, recently diagnosed PE presented to hospital with complaints of prolonged menstrual cycle. Patient does have history of menorrhagia in the past. Follows up with OB/GYN for the same. Known history of fibroids as well. Had an umbilical mass for which she went for CT abdomen which incidentally identified a potential PE. She was referred to ER for further evaluation and a CT PE protocol confirmed diagnosis of acute segmental right middle lobe and right lower lobe PE without any right heart strain. Patient was completely asymptomatic without any chest pain, shortness of breath, tachycardia, dizziness, lightheadedness or leg pain or leg cramps. She was started on Xarelto which she initially handled okay. Last week she started having return of excessive menstrual bleeding. Went through 28 pads and more in 3 to 4 days. At which time she decided to come to the ER. She also noted to have some left-sided numbness in the left upper extremity and shoulder area and in the left lower leg a feeling of sciatica since last 3 to 4 days. No fall no trauma no injury reported. No bleeding anywhere else reported. Since arrival to ER she continues to have some clot coming out every time she tries to go to the bathroom. She does not appear to  have any active bleeding other than that.  Patient reports significant history of DVT/PE in young age on her mother side of family with few deaths secondary to the PE as well.  Review of Systems: As mentioned in the history of present illness. All other systems reviewed and are negative.  Prior to Admission medications   Medication Sig Start Date End Date Taking? Authorizing Provider  Ferrous Sulfate (IRON PO) Take 1 tablet by mouth daily at 6 (six) AM.   Yes [provider]  norethindrone (AYGESTIN) 5 MG tablet Take 1 tablet (5 mg total) by mouth daily. Patient taking differently: Take 5 mg by mouth every evening. 10/08/22  Yes Jerene Bears, MD  RIVAROXABAN Carlena Hurl) VTE STARTER PACK (15 & 20 MG) Take 1 each by mouth once for 1 dose. Follow package directions: Take one 15mg  tablet by mouth twice a day. On day 22, switch to one 20mg  tablet once a day. Take with food. Patient taking differently: Take 1 each by mouth See admin instructions. Follow package directions: Take one 15mg  tablet by mouth twice a day. On day 22, switch to one 20mg  tablet once a day. Take with food. 10/19/22 10/30/22 Yes Glyn Ade, MD    Past Medical History:  Diagnosis Date   H/O rubella    H/O varicella    History of pregnancy induced hypertension    IDA (iron deficiency anemia)    Preeclampsia    h/o severe preeclampsia with pregnancies   Past Surgical History:  Procedure Laterality Date   CESAREAN SECTION  11/16/2011  Procedure: CESAREAN SECTION;  Surgeon: Michael Litter, MD;  Location: [facility];  Service: Gynecology;  Laterality: N/A;  Primary cesarean section with delivery of baby boy at 59.  Apgars 6/8.   CESAREAN SECTION N/A 07/14/2018   Procedure: CESAREAN SECTION;  Surgeon: Maxie Better, MD;  Location: [facility];  Service: Obstetrics;  Laterality: N/A;   LAPAROSCOPIC TUBAL LIGATION Bilateral 12/06/2018   Procedure: LAPAROSCOPIC TUBAL LIGATION With Bipolar Cautery;  Surgeon: Maxie Better, MD;  Location: [facility];  Service: Gynecology;  Laterality: Bilateral;   WISDOM TOOTH EXTRACTION     Social History:  reports that she quit smoking about 5 years ago. Her smoking use included cigarettes. She has never used smokeless tobacco. She reports current alcohol use of about 1.0 standard drink of alcohol per week. She reports that she does not use drugs. Allergies  Allergen Reactions   Latex Itching   Family History  Problem Relation Age of Onset   Cancer Maternal Aunt    Cancer Maternal Grandmother    Physical Exam: Vitals:   10/30/22 1049 10/30/22 1050 10/30/22 1537 10/30/22 1730  BP: 121/78  (!) 98/50 107/69  Pulse: 93  80   Resp: (!) 24  (!) 22   Temp:  98.4 F (36.9 C)  98.1 F (36.7 C)  TempSrc:    Oral  SpO2: 100%  100%    General: Appear in mild distress; no visible Abnormal Neck Mass Or lumps, Conjunctiva normal Cardiovascular: S1 and S2 Present, no Murmur, Respiratory: good respiratory effort, Bilateral Air entry present and CTA, no Crackles, no wheezes Abdomen: Bowel Sound present, Non tender  Extremities: no Pedal edema Neurology: alert and oriented to time, place, and person, no pronator drift, normal finger-nose-finger, extraocular muscle movement: Normal, pupils are equal and reactive to light bilaterally. Motor strength equal bilaterally. Sensation to light touch present bilaterally equal. Gait not checked due to patient safety concerns   Data Reviewed: I have reviewed ED notes, Vitals, Lab results and outpatient records. Since last encounter, pertinent lab results CBC and BMP and type and screen   . I have ordered test including CBC and BMP  . I have discussed pt's care plan and test results with GYN as well as hematology on-call  .  Assessment and Plan Acute blood loss anemia Menorrhagia with irregular cycle Endometriosis, umbilicus Presents with complaints of excessive menstrual bleeding ongoing for last 19  days. Suspect this is secondary to use of Xarelto. Patient was already on progestin 5 mg daily prior to admission. GYN initially recommended to increase it to 10 mg twice daily. Based on my conversation with on-call GYN for [facility] faculty practice, we will switch her to make as 20 mg 3 times daily for 5 days followed by 20 mg twice daily for 5 days followed by 20 mg daily. Monitor response. May require IR evaluation for embolization if he has profuse bleeding.  Iron deficiency anemia due to chronic blood loss Initial iron study pending.  1 month ago iron level was very low.  Will be providing IV iron for 2 days as well as B12 and vitamin C supplementation.  PE (pulmonary thromboembolism) (HCC) Identified incidentally. Has a strong family history of DVT PE antibody engaged. Patient is already referred to hematology outpatient for further workup. Low utility of initiating hypercoagulable workup right now. Discussed with on-call hematology, agree with currently holding anticoagulation and should her hemoglobin remained stable challenge her with IV heparin. Will also check lower extremity Doppler  for DVT and if positive may require consideration for IVC filter. This was discussed with the patient as well.  Left adnexal lesion concerning for possible hydrosalpinx Seen incidentally on the ultrasound. Discussed with GYN on-call for [facility] and recommend as long as the patient remains asymptomatic without any fever or abdominal pain no need to pursue further workup and they will follow-up with ultrasound outpatient.  Advance Care Planning:   Code Status: Full Code  Consults: GYN Family Communication: Family at bedside  Author: Lynden Oxford, MD 10/30/2022 6:47 PM For on call review [email or web address].

## 2022-10-30 NOTE — ED Notes (Signed)
Patient transported to US 

## 2022-10-31 ENCOUNTER — Inpatient Hospital Stay (HOSPITAL_COMMUNITY): Payer: BC Managed Care – PPO

## 2022-10-31 DIAGNOSIS — N80C2 Endometriosis of the umbilicus: Secondary | ICD-10-CM | POA: Diagnosis not present

## 2022-10-31 DIAGNOSIS — I2699 Other pulmonary embolism without acute cor pulmonale: Secondary | ICD-10-CM

## 2022-10-31 DIAGNOSIS — D62 Acute posthemorrhagic anemia: Secondary | ICD-10-CM | POA: Diagnosis not present

## 2022-10-31 LAB — CBC
HCT: 29.5 % — ABNORMAL LOW (ref 36.0–46.0)
HCT: 23.4 % — ABNORMAL LOW (ref 36.0–46.0)
Hemoglobin: 9.9 g/dL — ABNORMAL LOW (ref 12.0–15.0)
Hemoglobin: 7.7 g/dL — ABNORMAL LOW (ref 12.0–15.0)
MCH: 27 pg (ref 26.0–34.0)
MCH: 26.4 pg (ref 26.0–34.0)
MCHC: 33.6 g/dL (ref 30.0–36.0)
MCHC: 32.9 g/dL (ref 30.0–36.0)
MCV: 80.6 fL (ref 80.0–100.0)
MCV: 80.1 fL (ref 80.0–100.0)
Platelets: 460 10*3/uL — ABNORMAL HIGH (ref 150–400)
Platelets: 363 10*3/uL (ref 150–400)
RBC: 3.66 MIL/uL — ABNORMAL LOW (ref 3.87–5.11)
RBC: 2.92 MIL/uL — ABNORMAL LOW (ref 3.87–5.11)
RDW: 18.9 % — ABNORMAL HIGH (ref 11.5–15.5)
RDW: 19.3 % — ABNORMAL HIGH (ref 11.5–15.5)
WBC: 8.4 10*3/uL (ref 4.0–10.5)
WBC: 7 10*3/uL (ref 4.0–10.5)
nRBC: 0 % (ref 0.0–0.2)
nRBC: 0 % (ref 0.0–0.2)

## 2022-10-31 LAB — BASIC METABOLIC PANEL
Anion gap: 7 (ref 5–15)
BUN: <5 mg/dL — ABNORMAL LOW (ref 6–20)
CO2: 20 mmol/L — ABNORMAL LOW (ref 22–32)
Calcium: 8.4 mg/dL — ABNORMAL LOW (ref 8.9–10.3)
Chloride: 110 mmol/L (ref 98–111)
Creatinine, Ser: 0.86 mg/dL (ref 0.44–1.00)
GFR, Estimated: >60 mL/min (ref >60)
Glucose, Bld: 89 mg/dL (ref 70–99)
Potassium: 3.9 mmol/L (ref 3.5–5.1)
Sodium: 137 mmol/L (ref 135–145)

## 2022-10-31 LAB — TYPE AND SCREEN: Antibody Screen: NEGATIVE

## 2022-10-31 LAB — HEPARIN LEVEL (UNFRACTIONATED): Heparin Unfractionated: 0.64 IU/mL (ref 0.30–0.70)

## 2022-10-31 LAB — BPAM RBC
Blood Product Expiration Date: 202407022359
ISSUE DATE / TIME: 202406131733
ISSUE DATE / TIME: 202406132237
ISSUE DATE / TIME: 202406140145

## 2022-10-31 LAB — HIV ANTIBODY (ROUTINE TESTING W REFLEX): HIV Screen 4th Generation wRfx: NONREACTIVE

## 2022-10-31 LAB — APTT: aPTT: 74 seconds — ABNORMAL HIGH (ref 24–36)

## 2022-10-31 MED ORDER — CYANOCOBALAMIN 1000 MCG/ML IJ SOLN
1000.0000 ug | Freq: Every day | INTRAMUSCULAR | Status: DC
  Administered 2022-10-31 – 2022-11-01 (×2): 1000 ug via SUBCUTANEOUS
  Filled 2022-10-31 (×2): qty 1

## 2022-10-31 MED ORDER — HEPARIN (PORCINE) 25000 UT/250ML-% IV SOLN
1150.0000 [IU]/h | INTRAVENOUS | Status: DC
  Administered 2022-10-31: 1150 [IU]/h via INTRAVENOUS
  Filled 2022-10-31 (×2): qty 250

## 2022-10-31 NOTE — Progress Notes (Addendum)
ANTICOAGULATION CONSULT NOTE  Pharmacy Consult for heparin Indication: pulmonary embolus  Allergies  Allergen Reactions   Latex Itching    Patient Measurements: Weight: 107.5 kg (236 lb 15.9 oz) Heparin Dosing Weight: 76kg  Vital Signs: Temp: 97.7 F (36.5 C) (06/14 1700) Temp Source: Oral (06/14 1700) BP: 104/65 (06/14 0750) Pulse Rate: 74 (06/14 0750)  Labs: Recent Labs    10/30/22 1148 10/30/22 1629 10/30/22 2148 10/31/22 0809 10/31/22 1017 10/31/22 1739 10/31/22 1759  HGB 6.4* 5.9* 7.7* 9.9* 7.7*  --   --   HCT 19.7* 18.8* 23.6* 29.5* 23.4*  --   --   PLT 570* 551* 564* 460* 363  --   --   APTT  --   --   --   --   --   --  74*  LABPROT  --  20.9*  --   --   --   --   --   INR  --  1.8*  --   --   --   --   --   HEPARINUNFRC  --   --   --   --   --  0.64  --   CREATININE 0.83  --   --  0.86  --   --   --      Estimated Creatinine Clearance: 106.4 mL/min (by C-G formula based on SCr of 0.86 mg/dL).   Assessment: Pt was recently started on Xarelto for PE. She presented with significant menorrhagia. She is s/p several PRBCs. Plan to bridge her with IV heparin today and re-assess to resume Xarelto tomorrow. We will use a modified goal for heparin without bolus.   Last dose of Xarelto 6/13 around 0800.  aPTT therapeutic at 74 sec.  Heparin level and aPTT are not yet correlating.  No other sources of bleeding.  Goal of Therapy:  Heparin level 0.3-0.5 units/ml aPTT 66-85 seconds Monitor platelets by anticoagulation protocol: Yes   Plan:  Continue heparin gtt at 1150 units/hr Daily heparin level, aPTT and CBC F/u resume Xarelto as able  Duong Haydel D. Laney Potash, PharmD, BCPS, BCCCP 10/31/2022, 7:19 PM

## 2022-10-31 NOTE — Progress Notes (Signed)
Mobility Specialist Progress Note   10/31/22 1424  Mobility  Activity Ambulated independently in hallway  Level of Assistance Modified independent, requires aide device or extra time  Assistive Device Other (Comment) (IV pole)  Distance Ambulated (ft) 470 ft  Activity Response Tolerated well  Mobility Referral Yes  $Mobility charge 1 Mobility  Mobility Specialist Start Time (ACUTE ONLY) 1413  Mobility Specialist Stop Time (ACUTE ONLY) 1424  Mobility Specialist Time Calculation (min) (ACUTE ONLY) 11 min   Patient received at EOB agreeable to participate in mobility. Ambulated in hallway independently with steady gait. Returned to room without complaint or incident. Was left back on EOB with all needs met, call bell in reach.    Frederico Hamman Mobility Specialist Please contact via SecureChat or  Rehab office at [phone]

## 2022-10-31 NOTE — Progress Notes (Signed)
ANTICOAGULATION CONSULT NOTE - Initial Consult  Pharmacy Consult for heparin Indication: pulmonary embolus  Allergies  Allergen Reactions   Latex Itching    Patient Measurements: Weight: 107.5 kg (236 lb 15.9 oz) Heparin Dosing Weight: 76kg  Vital Signs: Temp: 98.4 F (36.9 C) (06/14 0441) Temp Source: Oral (06/14 0441) BP: 100/64 (06/14 0441) Pulse Rate: 81 (06/14 0441)  Labs: Recent Labs    10/30/22 1148 10/30/22 1629 10/30/22 2148 10/31/22 0809 10/31/22 1017  HGB 6.4* 5.9* 7.7* 9.9* 7.7*  HCT 19.7* 18.8* 23.6* 29.5* 23.4*  PLT 570* 551* 564* 460* 363  LABPROT  --  20.9*  --   --   --   INR  --  1.8*  --   --   --   CREATININE 0.83  --   --  0.86  --     Estimated Creatinine Clearance: 106.4 mL/min (by C-G formula based on SCr of 0.86 mg/dL).   Medical History: Past Medical History:  Diagnosis Date   H/O rubella    H/O varicella    History of pregnancy induced hypertension    IDA (iron deficiency anemia)    Preeclampsia    h/o severe preeclampsia with pregnancies    Medications:  Medications Prior to Admission  Medication Sig Dispense Refill Last Dose   Ferrous Sulfate (IRON PO) Take 1 tablet by mouth daily at 6 (six) AM.   10/29/2022   norethindrone (AYGESTIN) 5 MG tablet Take 1 tablet (5 mg total) by mouth daily. (Patient taking differently: Take 5 mg by mouth every evening.) 90 tablet 3 10/29/2022   RIVAROXABAN (XARELTO) VTE STARTER PACK (15 & 20 MG) Take 1 each by mouth once for 1 dose. Follow package directions: Take one 15mg  tablet by mouth twice a day. On day 22, switch to one 20mg  tablet once a day. Take with food. (Patient taking differently: Take 1 each by mouth See admin instructions. Follow package directions: Take one 15mg  tablet by mouth twice a day. On day 22, switch to one 20mg  tablet once a day. Take with food.) 51 each 0 10/30/2022 at 0815   Scheduled:   B-complex with vitamin C  1 tablet Oral Daily   cyanocobalamin  1,000 mcg Subcutaneous  Daily   megestrol  20 mg Oral TID   Followed by   Melene Muller ON 11/04/2022] megestrol  20 mg Oral BID   Followed by   Melene Muller ON 11/10/2022] megestrol  20 mg Oral Daily   megestrol  20 mg Oral Once   Infusions:   ferric gluconate (FERRLECIT) IVPB 250 mg (10/31/22 0922)   heparin      Assessment: Pt was recently started on Xarelto for PE. She presented with significant menorrhagia. She is s/p several PRBCs. Plan to bridge her with IV heparin today and re-assess to resume Xarelto tomorrow. We will use a modified goal for heparin without bolus.   Hgb 6.4>>7.7 Plt wnl Scr 0.86 Last dose of Xarelto 6/13 around 0800  Goal of Therapy:  Heparin level 0.3-0.5 units/ml Monitor platelets by anticoagulation protocol: Yes   Plan:  Heparin 1150 units/hr Check 6 hr HL  Daily HL and CBC F/u resume Xarelto  Ulyses Southward, PharmD, [place], AAHIVP, CPP Infectious Disease Pharmacist 10/31/2022 10:56 AM

## 2022-10-31 NOTE — Progress Notes (Signed)
PROGRESS NOTE    Paden Hemmings  ZOX:[id number] DOB: [birth year] DOA: 10/30/2022 PCP: Patient, No Pcp Per    Chief Complaint  Patient presents with   Vaginal Bleeding   Back Pain   Abdominal Pain    Brief Narrative:   Monse Cunico is a 32 y.o. female with PMH significant of menorrhagia, endometriosis, obesity, recently diagnosed PE presented to hospital with complaints of prolonged menstrual cycle. Patient does have history of menorrhagia in the past.Follows up with OB/GYN for the same. Known history of fibroids as well.  Started on Xarelto for recent diagnosis of PE, she presents with symptomatic anemia.  Assessment & Plan:   Principal Problem:   Acute blood loss anemia Active Problems:   Menorrhagia with irregular cycle   Endometriosis, umbilicus   Obesity (BMI 30-39.9)   Iron deficiency anemia due to chronic blood loss   PE (pulmonary thromboembolism) (HCC)   Left adnexal lesion concerning for possible hydrosalpinx   Acute blood loss anemia/traumatic anemia Menorrhagia with irregular cycle Endometriosis -Patient presents with bleeding -Symptomatic anemia, with hemoglobin of 5.9, she was transfused 3 units PRBC with good response, hemoglobin this morning is at 9.9 -Admitting  physician discussed with on-call OB/GYN, who recommended increasing her progestin to  20 mg 3 times daily for 5 days followed by 20 mg twice daily for 5 days followed by 20 mg daily. -Now her CBC has improved, will challenge with heparin GTT on increased dose progestin and if stable can transition to Xarelto by tomorrow    Iron deficiency anemia due to chronic blood loss B12 deficiency  -Transfuse as needed -  continue with IV iron  -Started on IM B12 supplements  PE (pulmonary thromboembolism) (HCC) -Identified incidentally. -Has a strong family history of DVT PE antibody engaged. -Patient is already referred to hematology outpatient for further workup. -Low utility of initiating  hypercoagulable workup right now. -Will challenge with IV heparin today, and if stable can transition to Xarelto tomorrow -Will also check lower extremity Doppler for DVT and if positive may require consideration for IVC filter. This was discussed with the patient as well.   Left adnexal lesion concerning for possible hydrosalpinx - Seen incidentally on the ultrasound. -Admitting physician discussed with GYN on-call for [facility] and recommend as long as the patient remains asymptomatic without any fever or abdominal pain no need to pursue further workup and they will follow-up with ultrasound outpatient.     DVT prophylaxis: Heparin Gtt Code Status: full Family Communication: None at bedside Disposition:   Status is: Inpatient    Consultants:  none   Subjective:  She reports her bleeding has improved overnight  Objective: Vitals:   10/30/22 2354 10/31/22 0138 10/31/22 0203 10/31/22 0441  BP: 97/61 104/73 99/63 100/64  Pulse: 86 69 71 81  Resp: 17 18 17 15   Temp: 97.7 F (36.5 C) 97.8 F (36.6 C) 98.8 F (37.1 C) 98.4 F (36.9 C)  TempSrc: Oral   Oral  SpO2: 100%       Intake/Output Summary (Last 24 hours) at 10/31/2022 1018 Last data filed at 10/31/2022 0441 Gross per 24 hour  Intake 1298 ml  Output --  Net 1298 ml   There were no vitals filed for this visit.  Examination:  Awake Alert, Oriented X 3, No new F.N deficits, Normal affect Symmetrical Chest wall movement, Good air movement bilaterally, CTAB RRR,No Gallops,Rubs or new Murmurs, No Parasternal Heave +ve B.Sounds, Abd Soft, No tenderness, No rebound - guarding or  rigidity. No Cyanosis, Clubbing or edema, No new Rash or bruise      Data Reviewed: I have personally reviewed following labs and imaging studies  CBC: Recent Labs  Lab 10/30/22 1148 10/30/22 1629 10/30/22 2148 10/31/22 0809  WBC 7.4 9.6 10.0 8.4  NEUTROABS 4.6 5.6  --   --   HGB 6.4* 5.9* 7.7* 9.9*  HCT 19.7* 18.8* 23.6* 29.5*  MCV  74.6* 75.5* 78.9* 80.6  PLT 570* 551* 564* 460*    Basic Metabolic Panel: Recent Labs  Lab 10/30/22 1148 10/31/22 0809  NA 139 137  K 3.9 3.9  CL 111 110  CO2 21* 20*  GLUCOSE 88 89  BUN 8 <5*  CREATININE 0.83 0.86  CALCIUM 8.8* 8.4*    GFR: CrCl cannot be calculated (Unknown ideal weight.).  Liver Function Tests: Recent Labs  Lab 10/30/22 1148  AST 15  ALT 15  ALKPHOS 30*  BILITOT 0.2*  PROT 6.5  ALBUMIN 4.1    CBG: No results for input(s): "GLUCAP" in the last 168 hours.   No results found for this or any previous visit (from the past 240 hour(s)).       Radiology Studies: US PELVIC COMPLETE W TRANSVAGINAL AND TORSION R/O  Result Date: 10/30/2022 CLINICAL DATA:  Right lower quadrant pain. History of endometriosis. EXAM: TRANSABDOMINAL AND TRANSVAGINAL ULTRASOUND OF PELVIS DOPPLER ULTRASOUND OF OVARIES TECHNIQUE: Both transabdominal and transvaginal ultrasound examinations of the pelvis were performed. Transabdominal technique was performed for global imaging of the pelvis including uterus, ovaries, adnexal regions, and pelvic cul-de-sac. It was necessary to proceed with endovaginal exam following the transabdominal exam to visualize the uterus, ovaries, and adnexa. Color and duplex Doppler ultrasound was utilized to evaluate blood flow to the ovaries. COMPARISON:  CT of 10/19/2022 FINDINGS: Uterus Measurements: 11.2 x 5.5 x 5.4 cm = volume: 176 mL. Diffuse uterine myometrial heterogeneity without well-defined mass. Endometrium Thickness: Normal, 12 mm.  No focal abnormality visualized. Right ovary Measurements: 3.3 x 2.8 x 2.9 cm = volume: 14 mL. Normal appearance/no adnexal mass. Left ovary Measurements: 2.3 x 1.7 x 2.4 cm = volume: 5.0 mL. Normal appearance of the ovary. Adjacent cylindrical hypoechoic structure is suspicious for hydrosalpinx including on image 28/2. On the order of 3.4 x 2.7 cm. Pulsed Doppler evaluation of both ovaries demonstrates normal  low-resistance arterial and venous waveforms. Other findings No abnormal free fluid. IMPRESSION: 1. No explanation for right lower quadrant pain. 2. Diffuse uterine heterogeneity is nonspecific but can be seen with adenomyosis. 3. Probable left-sided hydrosalpinx. Pre and postcontrast pelvic MRI could confirm. Electronically Signed   By: Jeronimo Greaves M.D.   On: 10/30/2022 14:18        Scheduled Meds:  B-complex with vitamin C  1 tablet Oral Daily   cyanocobalamin  1,000 mcg Subcutaneous Daily   megestrol  20 mg Oral TID   Followed by   Melene Muller ON 11/04/2022] megestrol  20 mg Oral BID   Followed by   Melene Muller ON 11/10/2022] megestrol  20 mg Oral Daily   megestrol  20 mg Oral Once   Continuous Infusions:  ferric gluconate (FERRLECIT) IVPB 250 mg (10/31/22 0922)     LOS: 1 day       Huey Bienenstock, MD [facility]   To contact the attending provider between 7A-7P or the covering provider during after hours 7P-7A, please log into the web site [email or web address] and access using universal [place] password for that web site. If you do not  have the password, please call the hospital operator.  10/31/2022, 10:18 AM

## 2022-10-31 NOTE — Progress Notes (Signed)
Lower extremity venous bilateral study completed.   Please see CV Proc for preliminary results.   Ginny Loomer, RDMS, RVT  

## 2022-11-01 ENCOUNTER — Other Ambulatory Visit (HOSPITAL_COMMUNITY): Payer: Self-pay

## 2022-11-01 DIAGNOSIS — D62 Acute posthemorrhagic anemia: Secondary | ICD-10-CM | POA: Diagnosis not present

## 2022-11-01 LAB — TYPE AND SCREEN
Unit division: 0
Unit division: 0

## 2022-11-01 LAB — BASIC METABOLIC PANEL
Anion gap: 8 (ref 5–15)
BUN: 6 mg/dL (ref 6–20)
CO2: 19 mmol/L — ABNORMAL LOW (ref 22–32)
Calcium: 8.4 mg/dL — ABNORMAL LOW (ref 8.9–10.3)
Chloride: 110 mmol/L (ref 98–111)
Creatinine, Ser: 0.81 mg/dL (ref 0.44–1.00)
GFR, Estimated: >60 mL/min (ref >60)
Glucose, Bld: 98 mg/dL (ref 70–99)
Potassium: 3.8 mmol/L (ref 3.5–5.1)
Sodium: 137 mmol/L (ref 135–145)

## 2022-11-01 LAB — CBC
HCT: 26.9 % — ABNORMAL LOW (ref 36.0–46.0)
Hemoglobin: 8.9 g/dL — ABNORMAL LOW (ref 12.0–15.0)
MCH: 26.8 pg (ref 26.0–34.0)
MCHC: 33.1 g/dL (ref 30.0–36.0)
MCV: 81 fL (ref 80.0–100.0)
Platelets: 445 10*3/uL — ABNORMAL HIGH (ref 150–400)
RBC: 3.32 MIL/uL — ABNORMAL LOW (ref 3.87–5.11)
RDW: 19.6 % — ABNORMAL HIGH (ref 11.5–15.5)
WBC: 8.5 10*3/uL (ref 4.0–10.5)
nRBC: 0.2 % (ref 0.0–0.2)

## 2022-11-01 LAB — BPAM RBC
Unit Type and Rh: 6200
Unit Type and Rh: 6200

## 2022-11-01 LAB — APTT: aPTT: 83 seconds — ABNORMAL HIGH (ref 24–36)

## 2022-11-01 LAB — HEPARIN LEVEL (UNFRACTIONATED): Heparin Unfractionated: 0.59 IU/mL (ref 0.30–0.70)

## 2022-11-01 MED ORDER — VITAMIN B-12 1000 MCG PO TABS
1000.0000 ug | ORAL_TABLET | Freq: Every day | ORAL | 0 refills | Status: DC
  Filled 2022-11-01: qty 130, 130d supply, fill #0

## 2022-11-01 MED ORDER — MEGESTROL ACETATE 40 MG PO TABS
ORAL_TABLET | ORAL | 0 refills | Status: DC
  Filled 2022-11-01: qty 25, 30d supply, fill #0

## 2022-11-01 MED ORDER — SODIUM CHLORIDE 0.9 % IV SOLN
250.0000 mg | Freq: Once | INTRAVENOUS | Status: AC
  Administered 2022-11-01: 250 mg via INTRAVENOUS
  Filled 2022-11-01: qty 20

## 2022-11-01 MED ORDER — MEGESTROL ACETATE 20 MG PO TABS
ORAL_TABLET | ORAL | 0 refills | Status: DC
  Filled 2022-11-01: qty 50, fill #0

## 2022-11-01 MED ORDER — FERROUS SULFATE 325 (65 FE) MG PO TABS
325.0000 mg | ORAL_TABLET | Freq: Three times a day (TID) | ORAL | 0 refills | Status: DC
  Filled 2022-11-01: qty 100, 34d supply, fill #0

## 2022-11-01 NOTE — Discharge Summary (Signed)
Physician Discharge Summary  Chloe Graves WUJ:[id number] DOB: 07-27-1990 DOA: 10/30/2022  PCP: Patient, No Pcp Per  Admit date: 10/30/2022 Discharge date: 11/01/2022   Recommendations for Outpatient Follow-up:  Follow up with PCP in 1-2 weeks Please obtain BMP/CBC in one week Follow on B12 level in 4 to 6 weeks after p.o. supplement Instructed to follow with OB/GYN regarding further recommendation of her endometriosis and heavy menstrual bleed    Discharge Condition: (Stable) CODE STATUS: (FULL) Diet recommendation:  Regular  Brief/Interim Summary:  Chloe Graves is a 32 y.o. female with PMH significant of menorrhagia, endometriosis, obesity, recently diagnosed PE presented to hospital with complaints of prolonged menstrual cycle. Patient does have history of menorrhagia in the past.Follows up with OB/GYN for the same. Known history of fibroids as well.  Started on Xarelto for recent diagnosis of PE, she presents with symptomatic anemia.  Felt due to to her menorrhagia with irregular cycle, heavy menstrual bleed.  Please see discussion below     Acute blood loss anemia/traumatic anemia Menorrhagia with irregular cycle Endometriosis -Patient presents with nevic and prolonged menstrual bleeding, -Symptomatic anemia, with hemoglobin of 5.9, she was transfused 3 units PRBC with good response, hemoglobin remained stable, it is 8.9 and day of discharge.  She was resumed on heparin GTT yesterday, overall her menstrual bleed back to baseline while on increased dose of progesterone, so she is to resume Xarelto on discharge. -Admitting  physician discussed with on-call OB/GYN, who recommended increasing her progestin to  20 mg 3 times daily for 5 days followed by 20 mg twice daily for 5 days followed by 20 mg daily.      Iron deficiency anemia due to chronic blood loss B12 deficiency  -B12 level is low, started on iron supplement during hospital stay, she will be discharged on  p.o. supplement, recheck level as an outpatient-iron level and ferritin level is low, received IV iron during hospital stay, to continue p.o. supplements on discharge  PE (pulmonary thromboembolism) (HCC) -Identified incidentally. -Has a strong family history of DVT PE antibody engaged. -Patient is already referred to hematology outpatient for further workup. -Low utility of initiating hypercoagulable workup right now. -She was on IV heparin yesterday, hemoglobin remained stable, to resume Xarelto today.   -lower ext venous Dopplers with no evidence  of DVT   Left adnexal lesion concerning for possible hydrosalpinx - Seen incidentally on the ultrasound. -Admitting physician discussed with GYN on-call for [facility] and recommend as long as the patient remains asymptomatic without any fever or abdominal pain no need to pursue further workup and they will follow-up with ultrasound outpatient.        Discharge Diagnoses:  Principal Problem:   Acute blood loss anemia Active Problems:   Menorrhagia with irregular cycle   Endometriosis, umbilicus   Obesity (BMI 30-39.9)   Iron deficiency anemia due to chronic blood loss   PE (pulmonary thromboembolism) (HCC)   Left adnexal lesion concerning for possible hydrosalpinx    Discharge Instructions  Discharge Instructions     Diet - low sodium heart healthy   Complete by: As directed    Discharge instructions   Complete by: As directed    Follow with Primary MD in 7 days   Get CBC, CMP, checked  by Primary MD next visit.    Disposition Home   Diet: Regular Diet   On your next visit with your primary care physician please Get Medicines reviewed and adjusted.   Please request your Prim.MD  to go over all Hospital Tests and Procedure/Radiological results at the follow up, please get all Hospital records sent to your Prim MD by signing hospital release before you go home.   If you experience worsening of your admission symptoms, develop  shortness of breath, life threatening emergency, suicidal or homicidal thoughts you must seek medical attention immediately by calling 911 or calling your MD immediately  if symptoms less severe.  You Must read complete instructions/literature along with all the possible adverse reactions/side effects for all the Medicines you take and that have been prescribed to you. Take any new Medicines after you have completely understood and accpet all the possible adverse reactions/side effects.   Do not drive, operating heavy machinery, perform activities at heights, swimming or participation in water activities or provide baby sitting services if your were admitted for syncope or siezures until you have seen by Primary MD or a Neurologist and advised to do so again.  Do not drive when taking Pain medications.    Do not take more than prescribed Pain, Sleep and Anxiety Medications  Special Instructions: If you have smoked or chewed Tobacco  in the last 2 yrs please stop smoking, stop any regular Alcohol  and or any Recreational drug use.  Wear Seat belts while driving.   Please note  You were cared for by a hospitalist during your hospital stay. If you have any questions about your discharge medications or the care you received while you were in the hospital after you are discharged, you can call the unit and asked to speak with the hospitalist on call if the hospitalist that took care of you is not available. Once you are discharged, your primary care physician will handle any further medical issues. Please note that NO REFILLS for any discharge medications will be authorized once you are discharged, as it is imperative that you return to your primary care physician (or establish a relationship with a primary care physician if you do not have one) for your aftercare needs so that they can reassess your need for medications and monitor your lab values.   Increase activity slowly   Complete by: As  directed       Allergies as of 11/01/2022       Reactions   Latex Itching        Medication List     STOP taking these medications    norethindrone 5 MG tablet Commonly known as: AYGESTIN       TAKE these medications    cyanocobalamin 1000 MCG tablet Commonly known as: VITAMIN B12 Take 1 tablet (1,000 mcg total) by mouth daily.   ferrous sulfate 325 (65 FE) MG tablet Take 1 tablet (325 mg total) by mouth 3 (three) times daily with meals. What changed:  medication strength how much to take when to take this   megestrol 20 MG tablet Commonly known as: MEGACE Take 20 mg 3 times daily for 1 week, then 20 mg 2 times daily for 1 week, then 20 mg daily after that.   Rivaroxaban Starter Pack (15 mg and 20 mg) Commonly known as: XARELTO STARTER PACK Take 1 each by mouth once for 1 dose. Follow package directions: Take one 15mg  tablet by mouth twice a day. On day 22, switch to one 20mg  tablet once a day. Take with food. What changed: when to take this        Allergies  Allergen Reactions   Latex Itching    Consultations: none  Procedures/Studies: VAS [place] LOWER EXTREMITY VENOUS (DVT)  Result Date: 10/31/2022  Lower Venous DVT Study Patient Name:  NDIA SANKS  Date of Exam:   10/31/2022 Medical Rec #: [id number]            Accession #:    [id number] Date of Birth: 04-20-1991             Patient Gender: F Patient Age:   7 years Exam Location:  [facility] Procedure:      VAS [place] LOWER EXTREMITY VENOUS (DVT) Referring Phys: PRANAV PATEL --------------------------------------------------------------------------------  Indications: Pulmonary embolism. Other Indications: Symptomatic anemia on xarelto. Risk Factors: Extensive family history of DVT/PE. Comparison Study: 10-19-2022 CTA chest positive for PE Performing Technologist: Jean Rosenthal RDMS, RVT  Examination Guidelines: A complete evaluation includes B-mode imaging, spectral Doppler, color Doppler, and  power Doppler as needed of all accessible portions of each vessel. Bilateral testing is considered an integral part of a complete examination. Limited examinations for reoccurring indications may be performed as noted. The reflux portion of the exam is performed with the patient in reverse Trendelenburg.  +---------+---------------+---------+-----------+----------+--------------+ RIGHT    CompressibilityPhasicitySpontaneityPropertiesThrombus Aging +---------+---------------+---------+-----------+----------+--------------+ CFV      Full           Yes      Yes                                 +---------+---------------+---------+-----------+----------+--------------+ SFJ      Full                                                        +---------+---------------+---------+-----------+----------+--------------+ FV Prox  Full                                                        +---------+---------------+---------+-----------+----------+--------------+ FV Mid   Full                                                        +---------+---------------+---------+-----------+----------+--------------+ FV DistalFull                                                        +---------+---------------+---------+-----------+----------+--------------+ PFV      Full                                                        +---------+---------------+---------+-----------+----------+--------------+ POP      Full           Yes      Yes                                 +---------+---------------+---------+-----------+----------+--------------+  PTV      Full                                                        +---------+---------------+---------+-----------+----------+--------------+ PERO     Full                                                        +---------+---------------+---------+-----------+----------+--------------+ Gastroc  Full                                                         +---------+---------------+---------+-----------+----------+--------------+   +---------+---------------+---------+-----------+----------+--------------+ LEFT     CompressibilityPhasicitySpontaneityPropertiesThrombus Aging +---------+---------------+---------+-----------+----------+--------------+ CFV      Full           Yes      Yes                                 +---------+---------------+---------+-----------+----------+--------------+ SFJ      Full                                                        +---------+---------------+---------+-----------+----------+--------------+ FV Prox  Full                                                        +---------+---------------+---------+-----------+----------+--------------+ FV Mid   Full                                                        +---------+---------------+---------+-----------+----------+--------------+ FV DistalFull                                                        +---------+---------------+---------+-----------+----------+--------------+ PFV      Full                                                        +---------+---------------+---------+-----------+----------+--------------+ POP      Full           Yes      Yes                                 +---------+---------------+---------+-----------+----------+--------------+  PTV      Full                                                        +---------+---------------+---------+-----------+----------+--------------+ PERO     Full                                                        +---------+---------------+---------+-----------+----------+--------------+ Gastroc  Full                                                        +---------+---------------+---------+-----------+----------+--------------+    Summary: RIGHT: - There is no evidence of deep vein thrombosis in the lower extremity.  -  No cystic structure found in the popliteal fossa.  LEFT: - There is no evidence of deep vein thrombosis in the lower extremity.  - No cystic structure found in the popliteal fossa.  *See table(s) above for measurements and observations.    Preliminary    US PELVIC COMPLETE W TRANSVAGINAL AND TORSION R/O  Result Date: 10/30/2022 CLINICAL DATA:  Right lower quadrant pain. History of endometriosis. EXAM: TRANSABDOMINAL AND TRANSVAGINAL ULTRASOUND OF PELVIS DOPPLER ULTRASOUND OF OVARIES TECHNIQUE: Both transabdominal and transvaginal ultrasound examinations of the pelvis were performed. Transabdominal technique was performed for global imaging of the pelvis including uterus, ovaries, adnexal regions, and pelvic cul-de-sac. It was necessary to proceed with endovaginal exam following the transabdominal exam to visualize the uterus, ovaries, and adnexa. Color and duplex Doppler ultrasound was utilized to evaluate blood flow to the ovaries. COMPARISON:  CT of 10/19/2022 FINDINGS: Uterus Measurements: 11.2 x 5.5 x 5.4 cm = volume: 176 mL. Diffuse uterine myometrial heterogeneity without well-defined mass. Endometrium Thickness: Normal, 12 mm.  No focal abnormality visualized. Right ovary Measurements: 3.3 x 2.8 x 2.9 cm = volume: 14 mL. Normal appearance/no adnexal mass. Left ovary Measurements: 2.3 x 1.7 x 2.4 cm = volume: 5.0 mL. Normal appearance of the ovary. Adjacent cylindrical hypoechoic structure is suspicious for hydrosalpinx including on image 28/2. On the order of 3.4 x 2.7 cm. Pulsed Doppler evaluation of both ovaries demonstrates normal low-resistance arterial and venous waveforms. Other findings No abnormal free fluid. IMPRESSION: 1. No explanation for right lower quadrant pain. 2. Diffuse uterine heterogeneity is nonspecific but can be seen with adenomyosis. 3. Probable left-sided hydrosalpinx. Pre and postcontrast pelvic MRI could confirm. Electronically Signed   By: Jeronimo Greaves M.D.   On: 10/30/2022  14:18   CT Angio Chest PE W and/or Wo Contrast  Result Date: 10/19/2022 CLINICAL DATA:  High probability for PE EXAM: CT ANGIOGRAPHY CHEST WITH CONTRAST TECHNIQUE: Multidetector CT imaging of the chest was performed using the standard protocol during bolus administration of intravenous contrast. Multiplanar CT image reconstructions and MIPs were obtained to evaluate the vascular anatomy. RADIATION DOSE REDUCTION: This exam was performed according to the departmental dose-optimization program which includes automated exposure control, adjustment of the mA and/or kV according to patient size and/or use of iterative reconstruction technique. CONTRAST:  75mL OMNIPAQUE IOHEXOL 350 MG/ML SOLN COMPARISON:  CT abdomen 10/19/2022 FINDINGS: Cardiovascular: There is adequate opacification of the pulmonary arteries. There are segmental pulmonary emboli in the right middle lobe and right lower lobe. Heart and aorta are normal in size. There is an aberrant right subclavian artery, normal variant. No pericardial effusion. Mediastinum/Nodes: No enlarged mediastinal, hilar, or axillary lymph nodes. Thyroid gland, trachea, and esophagus demonstrate no significant findings. Lungs/Pleura: There is minimal dependent atelectasis bilaterally. The lungs are otherwise clear. No pleural effusion or pneumothorax. Upper Abdomen: No acute abnormality. Musculoskeletal: No chest wall abnormality. No acute or significant osseous findings. Review of the MIP images confirms the above findings. IMPRESSION: Acute segmental pulmonary emboli in the right middle lobe and right lower lobe. No evidence for right heart strain. Electronically Signed   By: Darliss Cheney M.D.   On: 10/19/2022 19:31   CT ABDOMEN W CONTRAST  Result Date: 10/19/2022 CLINICAL DATA:  32 year old female with periumbilical pain and bleeding during her. For the past year and a half. EXAM: CT ABDOMEN WITH CONTRAST TECHNIQUE: Multidetector CT imaging of the abdomen was performed  using the standard protocol following bolus administration of intravenous contrast. RADIATION DOSE REDUCTION: This exam was performed according to the departmental dose-optimization program which includes automated exposure control, adjustment of the mA and/or kV according to patient size and/or use of iterative reconstruction technique. CONTRAST:  80mL OMNIPAQUE IOHEXOL 300 MG/ML  SOLN COMPARISON:  No priors. FINDINGS: Lower chest: Possible filling defect in a segmental sized branch to the right lower lobe (axial image 4 of series 2), suspicious for incidental pulmonary embolism. Hepatobiliary: No suspicious cystic or solid hepatic lesions. No intra or extrahepatic biliary ductal dilatation. Gallbladder is unremarkable in appearance. Pancreas: No pancreatic mass. No pancreatic ductal dilatation. No pancreatic or peripancreatic fluid collections or inflammatory changes. Spleen: Unremarkable. Adrenals/Urinary Tract: Subcentimeter low-attenuation lesion in the upper pole of the left kidney, too small to definitively characterize, but statistically likely a tiny cyst (no imaging follow-up recommended). Right kidney and bilateral adrenal glands are otherwise normal in appearance. No hydroureteronephrosis in the visualized portions of the abdomen. Stomach/Bowel: The appearance of the stomach is normal. No pathologic dilatation of visualized portions of small bowel or colon. Normal appendix. Vascular/Lymphatic: No significant atherosclerotic disease, aneurysm or dissection noted in the visualized portions of the abdominal vasculature. No definite lymphadenopathy noted in the abdomen. Other: Enhancing soft tissue mass associated with the umbilicus (axial image 52 of series 2 and coronal image 94 of series 5) measuring approximately 2.7 x 2.0 x 2.4 cm. No significant volume of ascites and no pneumoperitoneum noted in the visualized portions of the peritoneal cavity. Musculoskeletal: There are no aggressive appearing lytic  or blastic lesions noted in the visualized portions of the skeleton. IMPRESSION: 1. Possible filling defect in a segmental sized pulmonary artery branch to the right lower lobe. If there is clinical concern for potential pulmonary embolism, further evaluation with PE protocol CT scan should be considered at this time. 2. Enhancing soft tissue mass associated with the umbilicus suspicious for potential endometrioma given the patient's history of spontaneous bleeding during menses. Electronically Signed   By: Trudie Reed M.D.   On: 10/19/2022 09:48   US PELVIS TRANSVAGINAL NON-OB (TV ONLY)  Result Date: 10/14/2022 CLINICAL DATA:  Irregular menses  EXAM: TRANSVAGINAL ULTRASOUND OF PELVIS  TECHNIQUE: Transvaginal ultrasound examination of the pelvis was performed for imaging of the pelvis including uterus, ovaries, adnexal regions, and pelvic cul-de-sac.   FINDINGS: Uterus: 8.8  x 6.0 x 5.8cm with intramural fibroids 2.3 and 1.7cm.  The largest one is displacing the endometrium anteriorly.  Anteverted.  Volume: 160.88ml  Endometrial thickness:  9.40mm  Right ovary:  3.5 x 4.3 x 2.1cm with 1.5 x 1.3cm follicle.  Volume:  16.74ml  Left ovary:  4.8 x 4.4 x 2.3cm with 1.7 x 1.7cm follicle.  Volume:  25.39ml  Other findings:  No abnormal free fluid.  Cervix WNL.      Subjective: Reports her menstrual bleed at baseline, not heavy after starting increased progestin  Discharge Exam: Vitals:   10/31/22 2329 11/01/22 0328  BP: 104/60 (!) 101/56  Pulse: 100 85  Resp: 20 20  Temp: 97.7 F (36.5 C) 97.8 F (36.6 C)  SpO2:     Vitals:   10/31/22 1700 10/31/22 1920 10/31/22 2329 11/01/22 0328  BP:  103/61 104/60 (!) 101/56  Pulse:   100 85  Resp: 20 18 20 20   Temp: 97.7 F (36.5 C) 97.8 F (36.6 C) 97.7 F (36.5 C) 97.8 F (36.6 C)  TempSrc: Oral Oral Oral Oral  SpO2:      Weight:        General: Pt is alert, awake, not in acute distress Cardiovascular: RRR, S1/S2 +, no rubs, no  gallops Respiratory: CTA bilaterally, no wheezing, no rhonchi Abdominal: Soft, NT, ND, bowel sounds + Extremities: no edema, no cyanosis    The results of significant diagnostics from this hospitalization (including imaging, microbiology, ancillary and laboratory) are listed below for reference.     Microbiology: No results found for this or any previous visit (from the past 240 hour(s)).   Labs: BNP (last 3 results) Recent Labs    10/19/22 1640  BNP 170.8*   Basic Metabolic Panel: Recent Labs  Lab 10/30/22 1148 10/31/22 0809 11/01/22 0452  NA 139 137 137  K 3.9 3.9 3.8  CL 111 110 110  CO2 21* 20* 19*  GLUCOSE 88 89 98  BUN 8 <5* 6  CREATININE 0.83 0.86 0.81  CALCIUM 8.8* 8.4* 8.4*   Liver Function Tests: Recent Labs  Lab 10/30/22 1148  AST 15  ALT 15  ALKPHOS 30*  BILITOT 0.2*  PROT 6.5  ALBUMIN 4.1   No results for input(s): "LIPASE", "AMYLASE" in the last 168 hours. No results for input(s): "AMMONIA" in the last 168 hours. CBC: Recent Labs  Lab 10/30/22 1148 10/30/22 1629 10/30/22 2148 10/31/22 0809 10/31/22 1017 11/01/22 0607  WBC 7.4 9.6 10.0 8.4 7.0 8.5  NEUTROABS 4.6 5.6  --   --   --   --   HGB 6.4* 5.9* 7.7* 9.9* 7.7* 8.9*  HCT 19.7* 18.8* 23.6* 29.5* 23.4* 26.9*  MCV 74.6* 75.5* 78.9* 80.6 80.1 81.0  PLT 570* 551* 564* 460* 363 445*   Cardiac Enzymes: No results for input(s): "CKTOTAL", "CKMB", "CKMBINDEX", "TROPONINI" in the last 168 hours. BNP: Invalid input(s): "POCBNP" CBG: No results for input(s): "GLUCAP" in the last 168 hours. D-Dimer No results for input(s): "DDIMER" in the last 72 hours. Hgb A1c No results for input(s): "HGBA1C" in the last 72 hours. Lipid Profile No results for input(s): "CHOL", "HDL", "LDLCALC", "TRIG", "CHOLHDL", "LDLDIRECT" in the last 72 hours. Thyroid function studies No results for input(s): "TSH", "T4TOTAL", "T3FREE", "THYROIDAB" in the last 72 hours.  Invalid input(s): "FREET3" Anemia work  up Recent Labs    10/30/22 1356  VITAMINB12 136*  FOLATE 12.2  FERRITIN 4*  TIBC 403  IRON <5*  RETICCTPCT 2.5  Urinalysis    Component Value Date/Time   COLORURINE YELLOW 10/30/2022 1154   APPEARANCEUR CLEAR 10/30/2022 1154   LABSPEC <1.005 (L) 10/30/2022 1154   PHURINE 6.0 10/30/2022 1154   GLUCOSEU NEGATIVE 10/30/2022 1154   HGBUR LARGE (A) 10/30/2022 1154   BILIRUBINUR NEGATIVE 10/30/2022 1154   BILIRUBINUR 1 11/05/2011 1017   KETONESUR NEGATIVE 10/30/2022 1154   PROTEINUR NEGATIVE 10/30/2022 1154   UROBILINOGEN 0.2 11/13/2011 1550   NITRITE NEGATIVE 10/30/2022 1154   LEUKOCYTESUR NEGATIVE 10/30/2022 1154   Sepsis Labs Recent Labs  Lab 10/30/22 2148 10/31/22 0809 10/31/22 1017 11/01/22 0607  WBC 10.0 8.4 7.0 8.5   Microbiology No results found for this or any previous visit (from the past 240 hour(s)).   Time coordinating discharge: Over 30 minutes  SIGNED:   Huey Bienenstock, MD  [facility] 11/01/2022, 10:08 AM Pager   If 7PM-7AM, please contact night-coverage [email or web address]

## 2022-11-01 NOTE — Discharge Instructions (Signed)
Follow with Primary MD in 7 days   Get CBC, CMP,  checked  by Primary MD next visit.    Disposition Home    Diet: Regular Diet   On your next visit with your primary care physician please Get Medicines reviewed and adjusted.   Please request your Prim.MD to go over all Hospital Tests and Procedure/Radiological results at the follow up, please get all Hospital records sent to your Prim MD by signing hospital release before you go home.   If you experience worsening of your admission symptoms, develop shortness of breath, life threatening emergency, suicidal or homicidal thoughts you must seek medical attention immediately by calling 911 or calling your MD immediately  if symptoms less severe.  You Must read complete instructions/literature along with all the possible adverse reactions/side effects for all the Medicines you take and that have been prescribed to you. Take any new Medicines after you have completely understood and accpet all the possible adverse reactions/side effects.   Do not drive, operating heavy machinery, perform activities at heights, swimming or participation in water activities or provide baby sitting services if your were admitted for syncope or siezures until you have seen by Primary MD or a Neurologist and advised to do so again.  Do not drive when taking Pain medications.    Do not take more than prescribed Pain, Sleep and Anxiety Medications  Special Instructions: If you have smoked or chewed Tobacco  in the last 2 yrs please stop smoking, stop any regular Alcohol  and or any Recreational drug use.  Wear Seat belts while driving.   Please note  You were cared for by a hospitalist during your hospital stay. If you have any questions about your discharge medications or the care you received while you were in the hospital after you are discharged, you can call the unit and asked to speak with the hospitalist on call if the hospitalist that took care of you  is not available. Once you are discharged, your primary care physician will handle any further medical issues. Please note that NO REFILLS for any discharge medications will be authorized once you are discharged, as it is imperative that you return to your primary care physician (or establish a relationship with a primary care physician if you do not have one) for your aftercare needs so that they can reassess your need for medications and monitor your lab values. 

## 2022-11-01 NOTE — Progress Notes (Addendum)
ANTICOAGULATION CONSULT NOTE- Follow Up  Pharmacy Consult for heparin Indication: pulmonary embolus  Allergies  Allergen Reactions   Latex Itching    Patient Measurements: Weight: 107.5 kg (236 lb 15.9 oz) Heparin Dosing Weight: 76kg  Vital Signs: Temp: 97.8 F (36.6 C) (06/15 0328) Temp Source: Oral (06/15 0328) BP: 101/56 (06/15 0328) Pulse Rate: 85 (06/15 0328)  Labs: Recent Labs    10/30/22 1148 10/30/22 1629 10/30/22 2148 10/31/22 0809 10/31/22 1017 10/31/22 1739 10/31/22 1759 11/01/22 0452 11/01/22 0607  HGB 6.4* 5.9*   < > 9.9* 7.7*  --   --   --  8.9*  HCT 19.7* 18.8*   < > 29.5* 23.4*  --   --   --  26.9*  PLT 570* 551*   < > 460* 363  --   --   --  445*  APTT  --   --   --   --   --   --  74* 83*  --   LABPROT  --  20.9*  --   --   --   --   --   --   --   INR  --  1.8*  --   --   --   --   --   --   --   HEPARINUNFRC  --   --   --   --   --  0.64  --  0.59  --   CREATININE 0.83  --   --  0.86  --   --   --  0.81  --    < > = values in this interval not displayed.     Estimated Creatinine Clearance: 112.9 mL/min (by C-G formula based on SCr of 0.81 mg/dL).   Assessment: Pt was recently started on Xarelto for PE. She presented with significant menorrhagia. She is s/p several PRBCs. Plan to bridge her with IV heparin today and re-assess to resume Xarelto tomorrow. We will use a modified goal for heparin without bolus.   Last dose of Xarelto 6/13 around 0800  aPTT therapeutic at 83 sec and heparin level 0.59.  Heparin level and aPTT are almost correlating.  No other sources of bleeding identified; Hgb 7.7>>8.9  Goal of Therapy:  Heparin level 0.3-0.5 units/ml aPTT 66-85 seconds Monitor platelets by anticoagulation protocol: Yes   Plan:  Continue heparin gtt at 1150 units/hr Daily heparin level, aPTT and CBC F/u resume Xarelto as able (this AM)  Arabella Merles, PharmD. [facility] Acute Care PGY-1 11/01/2022 7:09 AM

## 2022-11-03 NOTE — Telephone Encounter (Signed)
Called pt to schedule follow up appt. Advised that Dr. Hyacinth Meeker would also like to place IUD at this appt for bleeding control if she is willing. Pt agrees. Appt provided.

## 2022-11-05 ENCOUNTER — Ambulatory Visit (INDEPENDENT_AMBULATORY_CARE_PROVIDER_SITE_OTHER): Payer: BC Managed Care – PPO | Admitting: Obstetrics & Gynecology

## 2022-11-05 ENCOUNTER — Encounter (HOSPITAL_BASED_OUTPATIENT_CLINIC_OR_DEPARTMENT_OTHER): Payer: Self-pay | Admitting: Obstetrics & Gynecology

## 2022-11-05 VITALS — BP 122/83 | HR 80 | Ht 62.75 in | Wt 227.8 lb

## 2022-11-05 DIAGNOSIS — R638 Other symptoms and signs concerning food and fluid intake: Secondary | ICD-10-CM

## 2022-11-05 DIAGNOSIS — I2699 Other pulmonary embolism without acute cor pulmonale: Secondary | ICD-10-CM | POA: Diagnosis not present

## 2022-11-05 DIAGNOSIS — R1909 Other intra-abdominal and pelvic swelling, mass and lump: Secondary | ICD-10-CM | POA: Diagnosis not present

## 2022-11-05 DIAGNOSIS — D500 Iron deficiency anemia secondary to blood loss (chronic): Secondary | ICD-10-CM

## 2022-11-05 DIAGNOSIS — N921 Excessive and frequent menstruation with irregular cycle: Secondary | ICD-10-CM

## 2022-11-05 MED ORDER — METRONIDAZOLE 500 MG PO TABS: 500.0000 mg | ORAL_TABLET | Freq: Two times a day (BID) | ORAL | 0 refills | Status: DC

## 2022-11-05 MED ORDER — MEGESTROL ACETATE 20 MG PO TABS
20.0000 mg | ORAL_TABLET | Freq: Two times a day (BID) | ORAL | 5 refills | Status: DC
Start: 2022-11-05 — End: 2022-12-31

## 2022-11-06 ENCOUNTER — Encounter: Payer: Self-pay | Admitting: Family Medicine

## 2022-11-06 ENCOUNTER — Ambulatory Visit (INDEPENDENT_AMBULATORY_CARE_PROVIDER_SITE_OTHER): Payer: BC Managed Care – PPO | Admitting: Family Medicine

## 2022-11-06 VITALS — BP 116/80 | HR 90 | Temp 98.5°F | Ht 62.75 in | Wt 230.2 lb

## 2022-11-06 DIAGNOSIS — N921 Excessive and frequent menstruation with irregular cycle: Secondary | ICD-10-CM

## 2022-11-06 DIAGNOSIS — D500 Iron deficiency anemia secondary to blood loss (chronic): Secondary | ICD-10-CM | POA: Diagnosis not present

## 2022-11-06 DIAGNOSIS — I2693 Single subsegmental pulmonary embolism without acute cor pulmonale: Secondary | ICD-10-CM | POA: Diagnosis not present

## 2022-11-06 DIAGNOSIS — F419 Anxiety disorder, unspecified: Secondary | ICD-10-CM | POA: Diagnosis not present

## 2022-11-06 LAB — CBC WITH DIFFERENTIAL/PLATELET
Basophils Absolute: 0.1 10*3/uL (ref 0.0–0.1)
Basophils Relative: 0.9 % (ref 0.0–3.0)
Eosinophils Absolute: 0.1 10*3/uL (ref 0.0–0.7)
Eosinophils Relative: 1.5 % (ref 0.0–5.0)
HCT: 35.6 % — ABNORMAL LOW (ref 36.0–46.0)
Hemoglobin: 11.6 g/dL — ABNORMAL LOW (ref 12.0–15.0)
Lymphocytes Relative: 29.8 % (ref 12.0–46.0)
Lymphs Abs: 2.5 10*3/uL (ref 0.7–4.0)
MCHC: 32.4 g/dL (ref 30.0–36.0)
MCV: 84.6 fl (ref 78.0–100.0)
Monocytes Absolute: 0.8 10*3/uL (ref 0.1–1.0)
Monocytes Relative: 9 % (ref 3.0–12.0)
Neutro Abs: 5 10*3/uL (ref 1.4–7.7)
Neutrophils Relative %: 58.8 % (ref 43.0–77.0)
Platelets: 491 10*3/uL — ABNORMAL HIGH (ref 150.0–400.0)
RBC: 4.21 Mil/uL (ref 3.87–5.11)
RDW: 24.6 % — ABNORMAL HIGH (ref 11.5–15.5)
WBC: 8.5 10*3/uL (ref 4.0–10.5)

## 2022-11-06 LAB — BASIC METABOLIC PANEL
BUN: 11 mg/dL (ref 6–23)
CO2: 20 mEq/L (ref 19–32)
Calcium: 9.3 mg/dL (ref 8.4–10.5)
Chloride: 108 mEq/L (ref 96–112)
Creatinine, Ser: 0.94 mg/dL (ref 0.40–1.20)
GFR: 79.38 mL/min (ref >60.00)
Glucose, Bld: 87 mg/dL (ref 70–99)
Potassium: 4.2 mEq/L (ref 3.5–5.1)
Sodium: 137 mEq/L (ref 135–145)

## 2022-11-06 MED ORDER — APIXABAN 5 MG PO TABS
5.0000 mg | ORAL_TABLET | Freq: Two times a day (BID) | ORAL | 6 refills | Status: DC
Start: 2022-11-06 — End: 2023-05-29

## 2022-11-06 MED ORDER — LEVONORGESTREL 20 MCG/DAY IU IUD
1.0000 | INTRAUTERINE_SYSTEM | Freq: Once | INTRAUTERINE | Status: AC
Start: 2022-11-06 — End: 2022-11-05
  Administered 2022-11-05: 1 via INTRAUTERINE

## 2022-11-06 NOTE — Patient Instructions (Signed)
It was nice meeting you today. You can schedule a follow-up in the next few months or sooner if needed since you have all the other upcoming appointments.  [facility] Services: To make an appointment contact the office/provider you are interested in seeing.  No referral is needed.  The below is not an all inclusive list, but will help you get started.  [email or web address] -counseling located off of [address]  [email or web address] -website helps you find providers in your area  [facility] -Located off of [address]. across from [address]  Dr. Jannifer Franklin is a Therapist, sports with [facility]. [phone]  [facility]

## 2022-11-06 NOTE — Progress Notes (Signed)
Established Patient Office Visit   Subjective  Patient ID: Chloe Graves, female    DOB: 10-Apr-1991  Age: 32 y.o. MRN: [id number]  Chief Complaint  Patient presents with   Establish Care    Just started xarelto and megace. Will need another cbc, was n hospital 2 days. GYN did myrena to stop bleeding    Patient is a 32 year old female seen to est care and follow-up on ongoing concerns/issues.  Patient seen in ED on 10/19/22 for single subsegmental PE.  Started on Xarelto.  Pt states she did not have any sx at the time of dx.  No cough, CP, tacyhcardia, SOB, LE edema.  Pt hospitalized 6/13-6/15 for symptomatic anemia.  Symptoms due to acute blood loss anemia 2/2 menorrhagia with irregular cycles after starting Xarelto on 10/19/2022 for subsegmental PE.  Hemoglobin 5.9.  Patient transfused 3 units PRBCs.  Hemoglobin 8.9 a day of discharge.  Heparin drip while hospitalized.  Incidental finding of endometriosis noted with a left adnexal lesion concerning for possible hydrosalpinx.  Followed by OB/GYN for history of fibroids and AUB.  Had IUD placed yesterday.  Still bleeding but a little less with small clots.  On Megace, starting 20 mg twice daily dosing tomorrow.  Mild dizziness if he moves too fast.  Patient interested in hysterectomy.  Would like to switch from Xarelto to Eliquis due to cost and increased bleeding..  Given prescription savings card.  Was told PCP would need to write Rx.  Taking iron.  Denies constipation.  Actually feels like stools are more regular.  Patient endorses episodes of near panic/increased anxiety.  States her mind starts wandering/thoughts increase.  History of tobacco use:  Patient states she smoked Black and milds, 2/day briefly for 3-4 years  Surgical history: Tubal ligation 2020  Family history: Dad-leukemia and blood clots MGM-breast cancer MGF-cancer Maternal aunt breast cancer, blood clots.  Died from blood clots. Maternal aunt's daughter  (patient's cousin)-Lupus Maternal aunts daughter's son-blood clots   Past Medical History:  Diagnosis Date   H/O rubella    H/O varicella    History of pregnancy induced hypertension    IDA (iron deficiency anemia)    Preeclampsia    h/o severe preeclampsia with pregnancies   Past Surgical History:  Procedure Laterality Date   CESAREAN SECTION  11/16/2011   Procedure: CESAREAN SECTION;  Surgeon: Michael Litter, MD;  Location: [facility];  Service: Gynecology;  Laterality: N/A;  Primary cesarean section with delivery of baby boy at 66.  Apgars 6/8.   CESAREAN SECTION N/A 07/14/2018   Procedure: CESAREAN SECTION;  Surgeon: Maxie Better, MD;  Location: [facility];  Service: Obstetrics;  Laterality: N/A;   LAPAROSCOPIC TUBAL LIGATION Bilateral 12/06/2018   Procedure: LAPAROSCOPIC TUBAL LIGATION With Bipolar Cautery;  Surgeon: Maxie Better, MD;  Location: [facility];  Service: Gynecology;  Laterality: Bilateral;   WISDOM TOOTH EXTRACTION     Social History   Tobacco Use   Smoking status: Former    Years: 1    Types: Cigarettes    Quit date: 12/01/2016    Years since quitting: 5.9   Smokeless tobacco: Never  Vaping Use   Vaping Use: Never used  Substance Use Topics   Alcohol use: Yes    Alcohol/week: 1.0 standard drink of alcohol    Types: 1 Standard drinks or equivalent per week    Comment: occasional   Drug use: Never   Family History  Problem Relation Age of Onset  Cancer Maternal Aunt    Cancer Maternal Grandmother    Allergies  Allergen Reactions   Latex Itching      ROS Negative unless stated above    Objective:     BP 116/80 (BP Location: Right Arm, Patient Position: Sitting, Cuff Size: Large)   Pulse 90   Temp 98.5 F (36.9 C) (Oral)   Ht 5' 2.75" (1.594 m)   Wt 230 lb 3.2 oz (104.4 kg)   LMP 10/13/2022 (Exact Date)   SpO2 98%   BMI 41.10 kg/m    Physical Exam Constitutional:      General: She is not in acute  distress.    Appearance: Normal appearance.  HENT:     Head: Normocephalic and atraumatic.     Nose: Nose normal.     Mouth/Throat:     Mouth: Mucous membranes are moist.  Cardiovascular:     Rate and Rhythm: Normal rate and regular rhythm.     Heart sounds: Normal heart sounds. No murmur heard.    No gallop.  Pulmonary:     Effort: Pulmonary effort is normal. No respiratory distress.     Breath sounds: Normal breath sounds. No wheezing, rhonchi or rales.  Skin:    General: Skin is warm and dry.  Neurological:     Mental Status: She is alert and oriented to person, place, and time.       11/06/2022    1:15 PM 11/05/2022    3:36 PM 09/02/2022    9:16 AM  Depression screen PHQ 2/9  Decreased Interest 0 0 0  Down, Depressed, Hopeless 0 0 0  PHQ - 2 Score 0 0 0  Altered sleeping 1    Tired, decreased energy 1    Change in appetite 0    Feeling bad or failure about yourself  0    Trouble concentrating 0    Moving slowly or fidgety/restless 0    Suicidal thoughts 0    PHQ-9 Score 2    Difficult doing work/chores Not difficult at all        11/06/2022    1:16 PM  GAD 7 : Generalized Anxiety Score  Nervous, Anxious, on Edge 2  Control/stop worrying 0  Worry too much - different things 1  Trouble relaxing 1  Restless 0  Easily annoyed or irritable 1  Afraid - awful might happen 1  Total GAD 7 Score 6  Anxiety Difficulty Somewhat difficult     No results found for any visits on 11/06/22.    Assessment & Plan:  Single subsegmental pulmonary embolism without acute cor pulmonale (HCC) -Patient to complete starter pack of Xarelto.  Once complete start Rx for Eliquis. -     Apixaban; Take 1 tablet (5 mg total) by mouth 2 (two) times daily.  Dispense: 60 tablet; Refill: 6  Iron deficiency anemia due to chronic blood loss -H/H 8.9/26.9 on 11/01/2022 -     CBC with Differential/Platelet -     Iron, TIBC and Ferritin Panel -     Basic metabolic panel  Menorrhagia with  irregular cycle -IUD placed yesterday.  Continue follow-up with OB/GYN. -     CBC with Differential/Platelet -     Iron, TIBC and Ferritin Panel  Anxiety -GAD-7 score 6 -Discussed the importance of self-care. -Not currently interested in medication. -Consider counseling.  Given information about area providers.   Return if symptoms worsen or fail to improve.  Follow-up in the next few months at patient's  convenience for CPE  Deeann Saint, MD

## 2022-11-07 LAB — IRON,TIBC AND FERRITIN PANEL
%SAT: 18 % (calc) (ref 16–45)
Ferritin: 172 ng/mL — ABNORMAL HIGH (ref 16–154)
Iron: 66 ug/dL (ref 40–190)
TIBC: 373 mcg/dL (calc) (ref 250–450)

## 2022-11-08 NOTE — Progress Notes (Signed)
32 y.o. [id number] Married [place] American female presents for insertion of IUD due to menorrhagia that started after treatment for PE with Xarelto.  Bleeding is better with megace but has not stopped.  Does needs refill.  Discussed not tapering off this until she has had the IUD placed at least  a month.  New rx neded.    She was hospitalized and received 3 blood transfusions and iron infusion as well.  Seeing Dr. Salomon Fick on 6/20 and will have blood work done then.  She is planning on using Mirena IUD.  Pt has also been counseled about risks and benefits as well as complications.  Consent is obtained today.  All questions answered prior to start of procedure.   Recent STD testing:  not indicated as is married with no STI concerns LMP:  Patient's last menstrual period was 10/13/2022 (exact date).  Patient Active Problem List   Diagnosis Date Noted   Acute blood loss anemia 10/30/2022   Obesity (BMI 30-39.9) 10/30/2022   Iron deficiency anemia due to chronic blood loss 10/30/2022   PE (pulmonary thromboembolism) (HCC) 10/30/2022   Left adnexal lesion concerning for possible hydrosalpinx 10/30/2022   Umbilical mass 10/14/2022   Menorrhagia with irregular cycle 09/21/2022   Endometriosis, umbilicus 09/21/2022   Umbilical pain 09/21/2022   Preterm delivery 11/21/2011   Increased BMI 37.7  09/04/2011   Latex allergy 07/30/2011   Past Medical History:  Diagnosis Date   H/O rubella    H/O varicella    History of pregnancy induced hypertension    IDA (iron deficiency anemia)    Preeclampsia    h/o severe preeclampsia with pregnancies   Current Outpatient Medications on File Prior to Visit  Medication Sig Dispense Refill   cyanocobalamin (VITAMIN B12) 1000 MCG tablet Take 1 tablet (1,000 mcg total) by mouth daily. 130 tablet 0   ferrous sulfate 325 (65 FE) MG tablet Take 1 tablet (325 mg total) by mouth 3 (three) times daily with meals. 100 tablet 0   RIVAROXABAN (XARELTO) VTE STARTER PACK  (15 & 20 MG) Take 1 each by mouth once for 1 dose. Follow package directions: Take one 15mg  tablet by mouth twice a day. On day 22, switch to one 20mg  tablet once a day. Take with food. (Patient taking differently: Take 1 each by mouth See admin instructions. Follow package directions: Take one 15mg  tablet by mouth twice a day. On day 22, switch to one 20mg  tablet once a day. Take with food.) 51 each 0   No current facility-administered medications on file prior to visit.   Latex  Review of Systems  Genitourinary:        Vaginal bleeding   Vitals:   11/05/22 1535  BP: 122/83  Pulse: 80  Weight: 227 lb 12.8 oz (103.3 kg)  Height: 5' 2.75" (1.594 m)    Gen:  WNWF healthy female NAD Abdomen: soft, non-tender Groin:  no inguinal nodes palpated  Pelvic exam: Vulva:  normal female genitalia Vagina:  normal vagina Cervix:  Non-tender, Negative CMT, no lesions or redness. Uterus:  enlarged size, about 10 weeks, slightly nodular  Procedure:  Speculum reinserted.  Cervix visualized and cleansed with Betadine x 3.  Paracervical block was not placed.  Single toothed tenaculum applied to anterior lip of cervix without difficulty.  Uterus sounded to 8cm.  IUD package was opened.  IUD and introducer passed to fundus and then withdrawn slightly before IUD was passed into endometrial cavity.  Introducer removed.  Strings  cut to 2cm.  Tenaculum removed from cervix.  Minimal bleeding noted.  Pt tolerated the procedure well.  All instruments removed from vagina.  1. Menorrhagia with irregular cycle - will continue megace and new rx needed - megestrol (MEGACE) 20 MG tablet; Take 1 tablet (20 mg total) by mouth 2 (two) times daily.  Dispense: 60 tablet; Refill: 5 - levonorgestrel (MIRENA) 20 MCG/DAY IUD 1 each - recheck 6 weeks. Pt aware to call with any concern  2. Iron deficiency anemia due to chronic blood loss - on oral iron  3. PE (pulmonary thromboembolism) (HCC) - on xarelto.  Does have appt  with hematology scheduled  4. Umbilical mass - has appt with general surgery.  Aware no elective procedures for about 6 months after diagnosis for PE unless urgent/emergent  5. Increased BMI 37.7

## 2022-11-14 ENCOUNTER — Inpatient Hospital Stay: Payer: BC Managed Care – PPO | Attending: Hematology and Oncology | Admitting: Hematology and Oncology

## 2022-11-14 ENCOUNTER — Other Ambulatory Visit: Payer: Self-pay

## 2022-11-14 ENCOUNTER — Inpatient Hospital Stay: Payer: BC Managed Care – PPO

## 2022-11-14 VITALS — BP 118/75 | HR 90 | Temp 97.7°F | Resp 16 | Wt 228.0 lb

## 2022-11-14 DIAGNOSIS — R1033 Periumbilical pain: Secondary | ICD-10-CM | POA: Diagnosis not present

## 2022-11-14 DIAGNOSIS — Z87891 Personal history of nicotine dependence: Secondary | ICD-10-CM

## 2022-11-14 DIAGNOSIS — N920 Excessive and frequent menstruation with regular cycle: Secondary | ICD-10-CM | POA: Diagnosis not present

## 2022-11-14 DIAGNOSIS — Z809 Family history of malignant neoplasm, unspecified: Secondary | ICD-10-CM | POA: Insufficient documentation

## 2022-11-14 DIAGNOSIS — I2699 Other pulmonary embolism without acute cor pulmonale: Secondary | ICD-10-CM | POA: Insufficient documentation

## 2022-11-14 DIAGNOSIS — R5383 Other fatigue: Secondary | ICD-10-CM | POA: Insufficient documentation

## 2022-11-14 DIAGNOSIS — Z79899 Other long term (current) drug therapy: Secondary | ICD-10-CM | POA: Diagnosis not present

## 2022-11-14 DIAGNOSIS — Z7901 Long term (current) use of anticoagulants: Secondary | ICD-10-CM | POA: Diagnosis not present

## 2022-11-14 DIAGNOSIS — Z86711 Personal history of pulmonary embolism: Secondary | ICD-10-CM

## 2022-11-14 DIAGNOSIS — F410 Panic disorder [episodic paroxysmal anxiety] without agoraphobia: Secondary | ICD-10-CM

## 2022-11-14 DIAGNOSIS — R002 Palpitations: Secondary | ICD-10-CM | POA: Diagnosis not present

## 2022-11-14 DIAGNOSIS — D509 Iron deficiency anemia, unspecified: Secondary | ICD-10-CM | POA: Diagnosis not present

## 2022-11-14 LAB — ANTITHROMBIN III: AntiThromb III Func: 107 % (ref 75–120)

## 2022-11-14 NOTE — Progress Notes (Signed)
[facility] CONSULT NOTE  Patient Care Team: Patient, No Pcp Per as PCP - General (General Practice) Dillard, Samule Ohm, MD (Obstetrics and Gynecology)  CHIEF COMPLAINTS/PURPOSE OF CONSULTATION:  IDA and recent PE  ASSESSMENT & PLAN:   This is a very pleasant 32 year old female patient with new diagnosis of iron deficiency anemia and recent pulmonary embolism referred to hematology for additional recommendations.  #1 iron deficiency anemia secondary to menstrual blood loss.  She is working with gynecology and is hoping to proceed with hysterectomy before the end of this year given heavy menstrual cycles.  She is also on Megace which has helped light of her cycle. Since her ED visit, she received some packed red blood cells as well as some IV iron.  Her most recent labs show significant increase in hemoglobin.  She is tolerating oral iron well.  She has no complaints from the iron deficiency standpoint.  She will continue oral iron until her next visit.  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  Patient apparently had some shortness of breath and palpitations but I attributed this to a panic attack.  She however denies any provoking factors, no recent use of birth control prior to this event.  No trauma or surgery. She does have some family history of blood clots.  Given unprovoked DVT/PE in a young woman, we have discussed about hypercoagulable workup.  She will proceed with this.  She will continue anticoagulation and return to clinic in November.  She should at least consider continuing blood thinners for 6 months if there is no evidence of hypercoagulable disorders.  Now if she happens to have another blood clot, she will have to continue this indefinitely.  She is agreeable to all the recommendations.  Thank you for consulting [place] in the care of this patient.  Please do not hesitate to contact us with any additional questions or concerns.  HISTORY OF PRESENTING  ILLNESS:  Tsuneko Zhan 32 y.o. female is here because of IDA and PE  This is a very pleasant 32 yr pre menopausal female patient with recent ED admission for severe anemia and incidental finding of PE referred to hematology for the same. She says the PE is an incidental finding. She apparently had a CT abdomen for periumbilical pain and bleeding during her cycle, this showed incidental PE. She denies any provoking factors, no recent surgery, trauma, use of birth control etc. She has 2 kids, no peripartum or post partum DVT or PE. She now is post PRBC tranfusion and IV iron, oral iron replacement for IDA. On the day of the ED visit, she didn't feel well, dizzy. Prior to this, she denies any complains except for fatigue, excess sleeping. She attributed this to menorrhagia. She may have felt a bit SOB and palpitations but attributed this to a panic attack There is some family history of blood clots. She is otherwise quite healthy she says. Today, she has no complains, feels well and is taking her blood thinner as recommended Rest of the pertinent 10 point ROS reviewed and neg.  REVIEW OF SYSTEMS:   Constitutional: Denies fevers, chills or abnormal night sweats Eyes: Denies blurriness of vision, double vision or watery eyes Ears, nose, mouth, throat, and face: Denies mucositis or sore throat Respiratory: Denies cough, dyspnea or wheezes Cardiovascular: Denies palpitation, chest discomfort or lower extremity swelling Gastrointestinal:  Denies nausea, heartburn or change in bowel habits Skin: Denies abnormal skin rashes Lymphatics: Denies new lymphadenopathy or  easy bruising Neurological:Denies numbness, tingling or new weaknesses Behavioral/Psych: Mood is stable, no new changes  All other systems were reviewed with the patient and are negative.  MEDICAL HISTORY:  Past Medical History:  Diagnosis Date   H/O rubella    H/O varicella    History of pregnancy induced hypertension    IDA  (iron deficiency anemia)    Preeclampsia    h/o severe preeclampsia with pregnancies    SURGICAL HISTORY: Past Surgical History:  Procedure Laterality Date   CESAREAN SECTION  11/16/2011   Procedure: CESAREAN SECTION;  Surgeon: Michael Litter, MD;  Location: [facility];  Service: Gynecology;  Laterality: N/A;  Primary cesarean section with delivery of baby boy at 68.  Apgars 6/8.   CESAREAN SECTION N/A 07/14/2018   Procedure: CESAREAN SECTION;  Surgeon: Maxie Better, MD;  Location: [facility];  Service: Obstetrics;  Laterality: N/A;   LAPAROSCOPIC TUBAL LIGATION Bilateral 12/06/2018   Procedure: LAPAROSCOPIC TUBAL LIGATION With Bipolar Cautery;  Surgeon: Maxie Better, MD;  Location: [facility];  Service: Gynecology;  Laterality: Bilateral;   WISDOM TOOTH EXTRACTION      SOCIAL HISTORY: Social History   Socioeconomic History   Marital status: Married    Spouse name: Not on file   Number of children: Not on file   Years of education: Not on file   Highest education level: Not on file  Occupational History   Not on file  Tobacco Use   Smoking status: Former    Years: 1    Types: Cigarettes    Quit date: 12/01/2016    Years since quitting: 5.9   Smokeless tobacco: Never  Vaping Use   Vaping Use: Never used  Substance and Sexual Activity   Alcohol use: Yes    Alcohol/week: 1.0 standard drink of alcohol    Types: 1 Standard drinks or equivalent per week    Comment: occasional   Drug use: Never   Sexual activity: Yes    Birth control/protection: Surgical  Other Topics Concern   Not on file  Social History Narrative   ** Merged History Encounter **       Social Determinants of Health   Financial Resource Strain: Not on file  Food Insecurity: Not on file  Transportation Needs: Not on file  Physical Activity: Not on file  Stress: Not on file  Social Connections: Not on file  Intimate Partner Violence: Not on file    FAMILY HISTORY: Family  History  Problem Relation Age of Onset   Cancer Maternal Aunt    Cancer Maternal Grandmother     ALLERGIES:  is allergic to latex.  MEDICATIONS:  Current Outpatient Medications  Medication Sig Dispense Refill   apixaban (ELIQUIS) 5 MG TABS tablet Take 1 tablet (5 mg total) by mouth 2 (two) times daily. 60 tablet 6   cyanocobalamin (VITAMIN B12) 1000 MCG tablet Take 1 tablet (1,000 mcg total) by mouth daily. 130 tablet 0   ferrous sulfate 325 (65 FE) MG tablet Take 1 tablet (325 mg total) by mouth 3 (three) times daily with meals. 100 tablet 0   megestrol (MEGACE) 20 MG tablet Take 1 tablet (20 mg total) by mouth 2 (two) times daily. 60 tablet 5   metroNIDAZOLE (FLAGYL) 500 MG tablet Take 1 tablet (500 mg total) by mouth 2 (two) times daily. 14 tablet 0   RIVAROXABAN (XARELTO) VTE STARTER PACK (15 & 20 MG) Take 1 each by mouth once for 1 dose. Follow  package directions: Take one 15mg  tablet by mouth twice a day. On day 22, switch to one 20mg  tablet once a day. Take with food. (Patient taking differently: Take 1 each by mouth See admin instructions. Follow package directions: Take one 15mg  tablet by mouth twice a day. On day 22, switch to one 20mg  tablet once a day. Take with food.) 51 each 0   No current facility-administered medications for this visit.     PHYSICAL EXAMINATION: ECOG PERFORMANCE STATUS: 0 - Asymptomatic  Vitals:   11/14/22 1000  BP: 118/75  Pulse: 90  Resp: 16  Temp: 97.7 F (36.5 C)  SpO2: 100%   Filed Weights   11/14/22 1000  Weight: 228 lb (103.4 kg)    GENERAL:alert, no distress and comfortable SKIN: skin color, texture, turgor are normal, no rashes or significant lesions EYES: normal, conjunctiva are pink and non-injected, sclera clear OROPHARYNX:no exudate, no erythema and lips, buccal mucosa, and tongue normal  NECK: supple, thyroid normal size, non-tender, without nodularity LYMPH:  no palpable lymphadenopathy in the cervical, axillary  LUNGS:  clear to auscultation and percussion with normal breathing effort HEART: regular rate & rhythm and no murmurs and no lower extremity edema ABDOMEN:abdomen soft, non-tender and normal bowel sounds Musculoskeletal:no cyanosis of digits and no clubbing  PSYCH: alert & oriented x 3 with fluent speech NEURO: no focal motor/sensory deficits  LABORATORY DATA:  I have reviewed the data as listed Lab Results  Component Value Date   WBC 8.5 11/06/2022   HGB 11.6 (L) 11/06/2022   HCT 35.6 (L) 11/06/2022   MCV 84.6 11/06/2022   PLT 491.0 (H) 11/06/2022     Chemistry      Component Value Date/Time   NA 137 11/06/2022 1348   K 4.2 11/06/2022 1348   CL 108 11/06/2022 1348   CO2 20 11/06/2022 1348   BUN 11 11/06/2022 1348   CREATININE 0.94 11/06/2022 1348   CREATININE 0.94 11/21/2011 2115      Component Value Date/Time   CALCIUM 9.3 11/06/2022 1348   ALKPHOS 30 (L) 10/30/2022 1148   AST 15 10/30/2022 1148   ALT 15 10/30/2022 1148   BILITOT 0.2 (L) 10/30/2022 1148       RADIOGRAPHIC STUDIES: I have personally reviewed the radiological images as listed and agreed with the findings in the report. VAS [place] LOWER EXTREMITY VENOUS (DVT)  Result Date: 11/01/2022  Lower Venous DVT Study Patient Name:  TINLEE SESSUM  Date of Exam:   10/31/2022 Medical Rec #: [id number]            Accession #:    [id number] Date of Birth: [birth year]             Patient Gender: F Patient Age:   22 years Exam Location:  [facility] Procedure:      VAS [place] LOWER EXTREMITY VENOUS (DVT) Referring Phys: PRANAV PATEL --------------------------------------------------------------------------------  Indications: Pulmonary embolism. Other Indications: Symptomatic anemia on xarelto. Risk Factors: Extensive family history of DVT/PE. Comparison Study: 10-19-2022 CTA chest positive for PE Performing Technologist: Jean Rosenthal RDMS, RVT  Examination Guidelines: A complete evaluation includes B-mode imaging, spectral Doppler,  color Doppler, and power Doppler as needed of all accessible portions of each vessel. Bilateral testing is considered an integral part of a complete examination. Limited examinations for reoccurring indications may be performed as noted. The reflux portion of the exam is performed with the patient in reverse Trendelenburg.  +---------+---------------+---------+-----------+----------+--------------+ RIGHT    CompressibilityPhasicitySpontaneityPropertiesThrombus Aging +---------+---------------+---------+-----------+----------+--------------+ CFV  Full           Yes      Yes                                 +---------+---------------+---------+-----------+----------+--------------+ SFJ      Full                                                        +---------+---------------+---------+-----------+----------+--------------+ FV Prox  Full                                                        +---------+---------------+---------+-----------+----------+--------------+ FV Mid   Full                                                        +---------+---------------+---------+-----------+----------+--------------+ FV DistalFull                                                        +---------+---------------+---------+-----------+----------+--------------+ PFV      Full                                                        +---------+---------------+---------+-----------+----------+--------------+ POP      Full           Yes      Yes                                 +---------+---------------+---------+-----------+----------+--------------+ PTV      Full                                                        +---------+---------------+---------+-----------+----------+--------------+ PERO     Full                                                        +---------+---------------+---------+-----------+----------+--------------+ Gastroc  Full                                                         +---------+---------------+---------+-----------+----------+--------------+   +---------+---------------+---------+-----------+----------+--------------+  LEFT     CompressibilityPhasicitySpontaneityPropertiesThrombus Aging +---------+---------------+---------+-----------+----------+--------------+ CFV      Full           Yes      Yes                                 +---------+---------------+---------+-----------+----------+--------------+ SFJ      Full                                                        +---------+---------------+---------+-----------+----------+--------------+ FV Prox  Full                                                        +---------+---------------+---------+-----------+----------+--------------+ FV Mid   Full                                                        +---------+---------------+---------+-----------+----------+--------------+ FV DistalFull                                                        +---------+---------------+---------+-----------+----------+--------------+ PFV      Full                                                        +---------+---------------+---------+-----------+----------+--------------+ POP      Full           Yes      Yes                                 +---------+---------------+---------+-----------+----------+--------------+ PTV      Full                                                        +---------+---------------+---------+-----------+----------+--------------+ PERO     Full                                                        +---------+---------------+---------+-----------+----------+--------------+ Gastroc  Full                                                        +---------+---------------+---------+-----------+----------+--------------+  Summary: RIGHT: - There is no evidence of deep vein thrombosis in the  lower extremity.  - No cystic structure found in the popliteal fossa.  LEFT: - There is no evidence of deep vein thrombosis in the lower extremity.  - No cystic structure found in the popliteal fossa.  *See table(s) above for measurements and observations. Electronically signed by Heath Lark on 11/01/2022 at 6:03:36 PM.    Final    US PELVIC COMPLETE W TRANSVAGINAL AND TORSION R/O  Result Date: 10/30/2022 CLINICAL DATA:  Right lower quadrant pain. History of endometriosis. EXAM: TRANSABDOMINAL AND TRANSVAGINAL ULTRASOUND OF PELVIS DOPPLER ULTRASOUND OF OVARIES TECHNIQUE: Both transabdominal and transvaginal ultrasound examinations of the pelvis were performed. Transabdominal technique was performed for global imaging of the pelvis including uterus, ovaries, adnexal regions, and pelvic cul-de-sac. It was necessary to proceed with endovaginal exam following the transabdominal exam to visualize the uterus, ovaries, and adnexa. Color and duplex Doppler ultrasound was utilized to evaluate blood flow to the ovaries. COMPARISON:  CT of 10/19/2022 FINDINGS: Uterus Measurements: 11.2 x 5.5 x 5.4 cm = volume: 176 mL. Diffuse uterine myometrial heterogeneity without well-defined mass. Endometrium Thickness: Normal, 12 mm.  No focal abnormality visualized. Right ovary Measurements: 3.3 x 2.8 x 2.9 cm = volume: 14 mL. Normal appearance/no adnexal mass. Left ovary Measurements: 2.3 x 1.7 x 2.4 cm = volume: 5.0 mL. Normal appearance of the ovary. Adjacent cylindrical hypoechoic structure is suspicious for hydrosalpinx including on image 28/2. On the order of 3.4 x 2.7 cm. Pulsed Doppler evaluation of both ovaries demonstrates normal low-resistance arterial and venous waveforms. Other findings No abnormal free fluid. IMPRESSION: 1. No explanation for right lower quadrant pain. 2. Diffuse uterine heterogeneity is nonspecific but can be seen with adenomyosis. 3. Probable left-sided hydrosalpinx. Pre and postcontrast pelvic MRI  could confirm. Electronically Signed   By: Jeronimo Greaves M.D.   On: 10/30/2022 14:18   CT Angio Chest PE W and/or Wo Contrast  Result Date: 10/19/2022 CLINICAL DATA:  High probability for PE EXAM: CT ANGIOGRAPHY CHEST WITH CONTRAST TECHNIQUE: Multidetector CT imaging of the chest was performed using the standard protocol during bolus administration of intravenous contrast. Multiplanar CT image reconstructions and MIPs were obtained to evaluate the vascular anatomy. RADIATION DOSE REDUCTION: This exam was performed according to the departmental dose-optimization program which includes automated exposure control, adjustment of the mA and/or kV according to patient size and/or use of iterative reconstruction technique. CONTRAST:  75mL OMNIPAQUE IOHEXOL 350 MG/ML SOLN COMPARISON:  CT abdomen 10/19/2022 FINDINGS: Cardiovascular: There is adequate opacification of the pulmonary arteries. There are segmental pulmonary emboli in the right middle lobe and right lower lobe. Heart and aorta are normal in size. There is an aberrant right subclavian artery, normal variant. No pericardial effusion. Mediastinum/Nodes: No enlarged mediastinal, hilar, or axillary lymph nodes. Thyroid gland, trachea, and esophagus demonstrate no significant findings. Lungs/Pleura: There is minimal dependent atelectasis bilaterally. The lungs are otherwise clear. No pleural effusion or pneumothorax. Upper Abdomen: No acute abnormality. Musculoskeletal: No chest wall abnormality. No acute or significant osseous findings. Review of the MIP images confirms the above findings. IMPRESSION: Acute segmental pulmonary emboli in the right middle lobe and right lower lobe. No evidence for right heart strain. Electronically Signed   By: Darliss Cheney M.D.   On: 10/19/2022 19:31   CT ABDOMEN W CONTRAST  Result Date: 10/19/2022 CLINICAL DATA:  32 year old female with periumbilical pain and bleeding during her. For the past year and a half.  EXAM: CT ABDOMEN  WITH CONTRAST TECHNIQUE: Multidetector CT imaging of the abdomen was performed using the standard protocol following bolus administration of intravenous contrast. RADIATION DOSE REDUCTION: This exam was performed according to the departmental dose-optimization program which includes automated exposure control, adjustment of the mA and/or kV according to patient size and/or use of iterative reconstruction technique. CONTRAST:  80mL OMNIPAQUE IOHEXOL 300 MG/ML  SOLN COMPARISON:  No priors. FINDINGS: Lower chest: Possible filling defect in a segmental sized branch to the right lower lobe (axial image 4 of series 2), suspicious for incidental pulmonary embolism. Hepatobiliary: No suspicious cystic or solid hepatic lesions. No intra or extrahepatic biliary ductal dilatation. Gallbladder is unremarkable in appearance. Pancreas: No pancreatic mass. No pancreatic ductal dilatation. No pancreatic or peripancreatic fluid collections or inflammatory changes. Spleen: Unremarkable. Adrenals/Urinary Tract: Subcentimeter low-attenuation lesion in the upper pole of the left kidney, too small to definitively characterize, but statistically likely a tiny cyst (no imaging follow-up recommended). Right kidney and bilateral adrenal glands are otherwise normal in appearance. No hydroureteronephrosis in the visualized portions of the abdomen. Stomach/Bowel: The appearance of the stomach is normal. No pathologic dilatation of visualized portions of small bowel or colon. Normal appendix. Vascular/Lymphatic: No significant atherosclerotic disease, aneurysm or dissection noted in the visualized portions of the abdominal vasculature. No definite lymphadenopathy noted in the abdomen. Other: Enhancing soft tissue mass associated with the umbilicus (axial image 52 of series 2 and coronal image 94 of series 5) measuring approximately 2.7 x 2.0 x 2.4 cm. No significant volume of ascites and no pneumoperitoneum noted in the visualized portions of  the peritoneal cavity. Musculoskeletal: There are no aggressive appearing lytic or blastic lesions noted in the visualized portions of the skeleton. IMPRESSION: 1. Possible filling defect in a segmental sized pulmonary artery branch to the right lower lobe. If there is clinical concern for potential pulmonary embolism, further evaluation with PE protocol CT scan should be considered at this time. 2. Enhancing soft tissue mass associated with the umbilicus suspicious for potential endometrioma given the patient's history of spontaneous bleeding during menses. Electronically Signed   By: Trudie Reed M.D.   On: 10/19/2022 09:48    All questions were answered. The patient knows to call the clinic with any problems, questions or concerns. I spent 45 minutes in the care of this patient including H and P, review of records, counseling and coordination of care.     Rachel Moulds, MD 11/14/2022 10:19 AM

## 2022-11-15 ENCOUNTER — Telehealth: Payer: Self-pay | Admitting: Hematology and Oncology

## 2022-11-15 NOTE — Telephone Encounter (Signed)
Spoke with patient confirming upcoming appointment  

## 2022-11-16 LAB — PROTEIN S ACTIVITY: Protein S Activity: 66 % (ref 63–140)

## 2022-11-16 LAB — LUPUS ANTICOAGULANT PANEL
DRVVT: 139.8 s — ABNORMAL HIGH (ref 0.0–47.0)
PTT Lupus Anticoagulant: 49.1 s — ABNORMAL HIGH (ref 0.0–43.5)

## 2022-11-16 LAB — PTT-LA MIX: PTT-LA Mix: 48.7 s — ABNORMAL HIGH (ref 0.0–40.5)

## 2022-11-16 LAB — BETA-2-GLYCOPROTEIN I ABS, IGG/M/A
Beta-2 Glyco I IgG: <9 GPI IgG units (ref 0–20)
Beta-2-Glycoprotein I IgA: <9 GPI IgA units (ref 0–25)
Beta-2-Glycoprotein I IgM: <9 GPI IgM units (ref 0–32)

## 2022-11-16 LAB — HEXAGONAL PHASE PHOSPHOLIPID: Hexagonal Phase Phospholipid: 16 s — ABNORMAL HIGH (ref 0–11)

## 2022-11-16 LAB — DRVVT MIX: dRVVT Mix: 89.5 s — ABNORMAL HIGH (ref 0.0–40.4)

## 2022-11-16 LAB — PROTEIN C ACTIVITY: Protein C Activity: 88 % (ref 73–180)

## 2022-11-16 LAB — DRVVT CONFIRM: dRVVT Confirm: 1.7 ratio — ABNORMAL HIGH (ref 0.8–1.2)

## 2022-11-17 LAB — HEXAGONAL PHOSPHOLIPID NEUTRALIZATION: Hexagonal Phospholipid Neutral: 12 s — ABNORMAL HIGH

## 2022-11-19 LAB — CARDIOLIPIN ANTIBODIES, IGG, IGM, IGA
Anticardiolipin IgA: <9 APL U/mL (ref 0–11)
Anticardiolipin IgG: <9 GPL U/mL (ref 0–14)
Anticardiolipin IgM: <9 MPL U/mL (ref 0–12)

## 2022-11-26 ENCOUNTER — Telehealth: Payer: Self-pay | Admitting: Hematology and Oncology

## 2022-11-26 NOTE — Telephone Encounter (Signed)
Spoke with patient confirming upcoming appointment  

## 2022-11-28 LAB — PROTHROMBIN GENE MUTATION

## 2022-12-03 LAB — FACTOR 5 LEIDEN

## 2022-12-11 ENCOUNTER — Inpatient Hospital Stay: Payer: BC Managed Care – PPO | Attending: Hematology and Oncology | Admitting: Hematology and Oncology

## 2022-12-11 DIAGNOSIS — I2699 Other pulmonary embolism without acute cor pulmonale: Secondary | ICD-10-CM

## 2022-12-11 NOTE — Progress Notes (Signed)
[facility] CONSULT NOTE  Patient Care Team: Patient, No Pcp Per as PCP - General (General Practice) Dillard, Samule Ohm, MD (Obstetrics and Gynecology)  CHIEF COMPLAINTS/PURPOSE OF CONSULTATION:  IDA and recent PE  ASSESSMENT & PLAN:   This is a very pleasant 32 year old female patient with new diagnosis of iron deficiency anemia and recent pulmonary embolism referred to hematology for additional recommendations.  #1 iron deficiency anemia secondary to menstrual blood loss.  Her last labs showed significant improvement.  She currently continues on oral iron.  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  This is a follow-up telephone visit to review her hypercoagulable workup results.  Everything is unremarkable except for positive lupus anticoagulant which is likely a false positive while on Eliquis.  I have recommended that she continue anticoagulation for 6 months, come back in December to repeat lupus anticoagulant about at least a week or 10 days after she stops Eliquis.  She expressed understanding.  If repeat lupus anticoagulant is negative, then there is no indication for indefinite anticoagulation although it can be considered since she is very young and had an unprovoked episode of PE.  We have discussed about antiphospholipid antibody syndrome, anticoagulation of choice.  HISTORY OF PRESENTING ILLNESS:  Chloe Graves 32 y.o. female is here because of IDA and PE  This is a very pleasant 32 yr pre menopausal female patient with recent ED admission for severe anemia and incidental finding of PE referred to hematology for the same. She is here for telephone visit to review hypercoagulable workup results.  REVIEW OF SYSTEMS:   Constitutional: Denies fevers, chills or abnormal night sweats Eyes: Denies blurriness of vision, double vision or watery eyes Ears, nose, mouth, throat, and face: Denies mucositis or sore throat Respiratory: Denies cough,  dyspnea or wheezes Cardiovascular: Denies palpitation, chest discomfort or lower extremity swelling Gastrointestinal:  Denies nausea, heartburn or change in bowel habits Skin: Denies abnormal skin rashes Lymphatics: Denies new lymphadenopathy or easy bruising Neurological:Denies numbness, tingling or new weaknesses Behavioral/Psych: Mood is stable, no new changes  All other systems were reviewed with the patient and are negative.  MEDICAL HISTORY:  Past Medical History:  Diagnosis Date   H/O rubella    H/O varicella    History of pregnancy induced hypertension    IDA (iron deficiency anemia)    Preeclampsia    h/o severe preeclampsia with pregnancies    SURGICAL HISTORY: Past Surgical History:  Procedure Laterality Date   CESAREAN SECTION  11/16/2011   Procedure: CESAREAN SECTION;  Surgeon: Michael Litter, MD;  Location: [facility];  Service: Gynecology;  Laterality: N/A;  Primary cesarean section with delivery of baby boy at 61.  Apgars 6/8.   CESAREAN SECTION N/A 07/14/2018   Procedure: CESAREAN SECTION;  Surgeon: Maxie Better, MD;  Location: [facility];  Service: Obstetrics;  Laterality: N/A;   LAPAROSCOPIC TUBAL LIGATION Bilateral 12/06/2018   Procedure: LAPAROSCOPIC TUBAL LIGATION With Bipolar Cautery;  Surgeon: Maxie Better, MD;  Location: [facility];  Service: Gynecology;  Laterality: Bilateral;   WISDOM TOOTH EXTRACTION      SOCIAL HISTORY: Social History   Socioeconomic History   Marital status: Married    Spouse name: Not on file   Number of children: Not on file   Years of education: Not on file   Highest education level: Not on file  Occupational History   Not on file  Tobacco Use   Smoking status: Former  Current packs/day: 0.00    Types: Cigarettes    Start date: 12/02/2015    Quit date: 12/01/2016    Years since quitting: 6.0   Smokeless tobacco: Never  Vaping Use   Vaping status: Never Used  Substance and Sexual Activity    Alcohol use: Yes    Alcohol/week: 1.0 standard drink of alcohol    Types: 1 Standard drinks or equivalent per week    Comment: occasional   Drug use: Never   Sexual activity: Yes    Birth control/protection: Surgical  Other Topics Concern   Not on file  Social History Narrative   ** Merged History Encounter **       Social Determinants of Health   Financial Resource Strain: Not on file  Food Insecurity: Not on file  Transportation Needs: Not on file  Physical Activity: Not on file  Stress: Not on file  Social Connections: Not on file  Intimate Partner Violence: Not on file    FAMILY HISTORY: Family History  Problem Relation Age of Onset   Cancer Maternal Aunt    Cancer Maternal Grandmother     ALLERGIES:  is allergic to latex.  MEDICATIONS:  Current Outpatient Medications  Medication Sig Dispense Refill   apixaban (ELIQUIS) 5 MG TABS tablet Take 1 tablet (5 mg total) by mouth 2 (two) times daily. 60 tablet 6   cyanocobalamin (VITAMIN B12) 1000 MCG tablet Take 1 tablet (1,000 mcg total) by mouth daily. 130 tablet 0   ferrous sulfate 325 (65 FE) MG tablet Take 1 tablet (325 mg total) by mouth 3 (three) times daily with meals. 100 tablet 0   megestrol (MEGACE) 20 MG tablet Take 1 tablet (20 mg total) by mouth 2 (two) times daily. 60 tablet 5   metroNIDAZOLE (FLAGYL) 500 MG tablet Take 1 tablet (500 mg total) by mouth 2 (two) times daily. 14 tablet 0   RIVAROXABAN (XARELTO) VTE STARTER PACK (15 & 20 MG) Take 1 each by mouth once for 1 dose. Follow package directions: Take one 15mg  tablet by mouth twice a day. On day 22, switch to one 20mg  tablet once a day. Take with food. (Patient taking differently: Take 1 each by mouth See admin instructions. Follow package directions: Take one 15mg  tablet by mouth twice a day. On day 22, switch to one 20mg  tablet once a day. Take with food.) 51 each 0   No current facility-administered medications for this visit.     PHYSICAL  EXAMINATION: ECOG PERFORMANCE STATUS: 0 - Asymptomatic  There were no vitals filed for this visit.  There were no vitals filed for this visit.   PE not done, telephone visit.  I have reviewed the data as listed Lab Results  Component Value Date   WBC 8.5 11/06/2022   HGB 11.6 (L) 11/06/2022   HCT 35.6 (L) 11/06/2022   MCV 84.6 11/06/2022   PLT 491.0 (H) 11/06/2022     Chemistry      Component Value Date/Time   NA 137 11/06/2022 1348   K 4.2 11/06/2022 1348   CL 108 11/06/2022 1348   CO2 20 11/06/2022 1348   BUN 11 11/06/2022 1348   CREATININE 0.94 11/06/2022 1348   CREATININE 0.94 11/21/2011 2115      Component Value Date/Time   CALCIUM 9.3 11/06/2022 1348   ALKPHOS 30 (L) 10/30/2022 1148   AST 15 10/30/2022 1148   ALT 15 10/30/2022 1148   BILITOT 0.2 (L) 10/30/2022 1148  RADIOGRAPHIC STUDIES: I have personally reviewed the radiological images as listed and agreed with the findings in the report. No results found.  All questions were answered. The patient knows to call the clinic with any problems, questions or concerns. I spent 12 minutes in the care of this patient including H and P, review of records, counseling and coordination of care.  I connected with  Chloe Graves on 12/11/22 by a telephone application and verified that I am speaking with the correct person using two identifiers.   I discussed the limitations of evaluation and management by telemedicine. The patient expressed understanding and agreed to proceed.      Rachel Moulds, MD 12/11/2022 12:10 PM

## 2022-12-15 DIAGNOSIS — R1909 Other intra-abdominal and pelvic swelling, mass and lump: Secondary | ICD-10-CM | POA: Diagnosis not present

## 2022-12-25 ENCOUNTER — Ambulatory Visit: Payer: BC Managed Care – PPO | Admitting: Family Medicine

## 2022-12-31 ENCOUNTER — Ambulatory Visit (INDEPENDENT_AMBULATORY_CARE_PROVIDER_SITE_OTHER): Payer: BC Managed Care – PPO | Admitting: Obstetrics & Gynecology

## 2022-12-31 ENCOUNTER — Encounter (HOSPITAL_BASED_OUTPATIENT_CLINIC_OR_DEPARTMENT_OTHER): Payer: Self-pay | Admitting: Obstetrics & Gynecology

## 2022-12-31 VITALS — BP 122/78 | HR 74 | Ht 62.75 in | Wt 234.2 lb

## 2022-12-31 DIAGNOSIS — Z975 Presence of (intrauterine) contraceptive device: Secondary | ICD-10-CM

## 2022-12-31 DIAGNOSIS — N898 Other specified noninflammatory disorders of vagina: Secondary | ICD-10-CM

## 2022-12-31 DIAGNOSIS — N921 Excessive and frequent menstruation with irregular cycle: Secondary | ICD-10-CM

## 2022-12-31 DIAGNOSIS — Z30432 Encounter for removal of intrauterine contraceptive device: Secondary | ICD-10-CM | POA: Diagnosis not present

## 2022-12-31 DIAGNOSIS — D500 Iron deficiency anemia secondary to blood loss (chronic): Secondary | ICD-10-CM

## 2022-12-31 MED ORDER — METRONIDAZOLE 500 MG PO TABS
500.0000 mg | ORAL_TABLET | Freq: Two times a day (BID) | ORAL | 0 refills | Status: DC
Start: 2022-12-31 — End: 2023-05-27

## 2022-12-31 MED ORDER — MISOPROSTOL 100 MCG PO TABS
ORAL_TABLET | ORAL | 0 refills | Status: DC
Start: 2022-12-31 — End: 2023-01-25

## 2023-01-01 ENCOUNTER — Other Ambulatory Visit (HOSPITAL_BASED_OUTPATIENT_CLINIC_OR_DEPARTMENT_OTHER): Payer: Self-pay | Admitting: Obstetrics & Gynecology

## 2023-01-01 ENCOUNTER — Encounter (HOSPITAL_BASED_OUTPATIENT_CLINIC_OR_DEPARTMENT_OTHER): Payer: Self-pay | Admitting: Obstetrics & Gynecology

## 2023-01-01 DIAGNOSIS — I2699 Other pulmonary embolism without acute cor pulmonale: Secondary | ICD-10-CM

## 2023-01-01 DIAGNOSIS — N921 Excessive and frequent menstruation with irregular cycle: Secondary | ICD-10-CM

## 2023-01-01 MED ORDER — NORETHINDRONE ACETATE 5 MG PO TABS
5.0000 mg | ORAL_TABLET | Freq: Two times a day (BID) | ORAL | 3 refills | Status: DC
Start: 2023-01-01 — End: 2023-05-27

## 2023-01-09 NOTE — Progress Notes (Signed)
GYNECOLOGY  VISIT  CC:   desires IUD removal  HPI: 32 y.o. [id number] Married [place] or [place] American female here for removal of IUD that was placed 11/05/2022 due to menorrhagia.  Pt has been having heavier bleeding but was diagnosed with an incidental PE.  Initially she was on Xarelto and bleeding became very heavy.  She was hospitalized for significant anemia and transfusions on 6/13.  IUD was placed 6/19 after discharge.  Pt reports bleeding is not heavy but just has not stopped.  She is really tired of this and feels there is always an odor.  She desires IUD removal.  We did discuss the typical bleeding that occurs after IUD placement.  Right now, the plan would be for her to stay on Xarelto until December and then she can stop.  She knows she should not proceed with surgery until after that time.  She knows I am concerned about the volume of bleeding that she had in June, however, she is not on Xarelto so it could be better with the IUD removed.  Do feel she would benefit from progesterone and she is ok with this.  Will start norethindrone today.  Lab hb was 11.6 on 11/06/2022.   Past Medical History:  Diagnosis Date   H/O rubella    H/O varicella    History of pregnancy induced hypertension    IDA (iron deficiency anemia)    Preeclampsia    h/o severe preeclampsia with pregnancies    MEDS:   Current Outpatient Medications on File Prior to Visit  Medication Sig Dispense Refill   apixaban (ELIQUIS) 5 MG TABS tablet Take 1 tablet (5 mg total) by mouth 2 (two) times daily. 60 tablet 6   cyanocobalamin (VITAMIN B12) 1000 MCG tablet Take 1 tablet (1,000 mcg total) by mouth daily. 130 tablet 0   ferrous sulfate 325 (65 FE) MG tablet Take 1 tablet (325 mg total) by mouth 3 (three) times daily with meals. 100 tablet 0   RIVAROXABAN (XARELTO) VTE STARTER PACK (15 & 20 MG) Take 1 each by mouth once for 1 dose. Follow package directions: Take one 15mg  tablet by mouth twice a day. On day 22, switch  to one 20mg  tablet once a day. Take with food. (Patient taking differently: Take 1 each by mouth See admin instructions. Follow package directions: Take one 15mg  tablet by mouth twice a day. On day 22, switch to one 20mg  tablet once a day. Take with food.) 51 each 0   No current facility-administered medications on file prior to visit.    ALLERGIES: Latex  SH:  married, non smoker  Review of Systems  Genitourinary:        Irregular bleeding Vaginal odor    PHYSICAL EXAMINATION:    BP 122/78 (BP Location: Right Arm, Patient Position: Sitting, Cuff Size: Large)   Pulse 74   Ht 5' 2.75" (1.594 m) Comment: Reported  Wt 234 lb 3.2 oz (106.2 kg)   BMI 41.82 kg/m     General appearance: alert, cooperative and appears stated age Lymph:  no inguinal LAD noted  Pelvic: External genitalia:  no lesions              Urethra:  normal appearing urethra with no masses, tenderness or lesions              Bartholins and Skenes: normal                 Vagina: normal appearing vagina with  normal color and discharge, no lesions              Cervix: no lesions and no IUD string noted              Procedure:  speculum placed. Cervix cleansed with Betadine x 3.  Tenaculum applied to anterior lip of cervix.  Cervix dilated.  Curette passed into cervix and endometrial canal.  This was rotate clockwise and withdrawn several times.  The string did not come out nor did the IUD.  Due to pt discomfort, procedure ended.  Tenaculum removed.  Minimal bleeding present after procedure.  Pt tolerated procedure well.  Chaperone, Ina Homes, CMA, was present for exam.  Assessment/Plan: 1. Menorrhagia with irregular cycle - pt will start aygestin 5mg  BID.  Rx to pharmacy  2.  Vaginal odor - discussed testing first or treatment and pt desired treatment without any testing. - metroNIDAZOLE (FLAGYL) 500 MG tablet; Take 1 tablet (500 mg total) by mouth 2 (two) times daily.  Dispense: 14 tablet; Refill: 0  3. Iron  deficiency anemia due to chronic blood loss - she is taking oral iron  4. IUD (intrauterine device) in place - pt will return for removal with ultrasound guidance - will pre treat with cytotec - misoprostol (CYTOTEC) 100 MCG tablet; Place tablet buccally pm and am before procedure  Dispense: 2 tablet; Refill: 0

## 2023-01-14 ENCOUNTER — Encounter (HOSPITAL_BASED_OUTPATIENT_CLINIC_OR_DEPARTMENT_OTHER): Payer: Self-pay | Admitting: Obstetrics & Gynecology

## 2023-01-14 ENCOUNTER — Ambulatory Visit (INDEPENDENT_AMBULATORY_CARE_PROVIDER_SITE_OTHER): Payer: BC Managed Care – PPO | Admitting: Obstetrics & Gynecology

## 2023-01-14 ENCOUNTER — Other Ambulatory Visit (HOSPITAL_BASED_OUTPATIENT_CLINIC_OR_DEPARTMENT_OTHER): Payer: Self-pay | Admitting: Obstetrics & Gynecology

## 2023-01-14 ENCOUNTER — Ambulatory Visit (HOSPITAL_BASED_OUTPATIENT_CLINIC_OR_DEPARTMENT_OTHER): Payer: BC Managed Care – PPO

## 2023-01-14 VITALS — BP 112/71 | HR 93 | Ht 62.75 in | Wt 236.8 lb

## 2023-01-14 DIAGNOSIS — D251 Intramural leiomyoma of uterus: Secondary | ICD-10-CM

## 2023-01-14 DIAGNOSIS — Z30432 Encounter for removal of intrauterine contraceptive device: Secondary | ICD-10-CM | POA: Diagnosis not present

## 2023-01-14 DIAGNOSIS — Z7901 Long term (current) use of anticoagulants: Secondary | ICD-10-CM

## 2023-01-14 DIAGNOSIS — N921 Excessive and frequent menstruation with irregular cycle: Secondary | ICD-10-CM

## 2023-01-14 DIAGNOSIS — I2699 Other pulmonary embolism without acute cor pulmonale: Secondary | ICD-10-CM | POA: Diagnosis not present

## 2023-01-14 DIAGNOSIS — Z8742 Personal history of other diseases of the female genital tract: Secondary | ICD-10-CM

## 2023-01-21 ENCOUNTER — Encounter (HOSPITAL_BASED_OUTPATIENT_CLINIC_OR_DEPARTMENT_OTHER): Payer: Self-pay | Admitting: Obstetrics & Gynecology

## 2023-01-25 NOTE — Progress Notes (Signed)
32 y.o. [id number] Married [place] American female presents for removal of Mirena IUD.  She feels she is having unacceptable side effects including persistent spotting.  She knows I am concerned about the possible bleeding she may have after the removal due to eliquis she is taking.  I cannot see the IUD strings on physical exam so she is here for u/s guidance for removal.    I have recommended she start norethindrone 5mg  daily to help with bleeding when it does restart.  She took flagyl for vaginal odor that she felt was due to the persistent spotting.  This has improved.  Content obtained.   Patient Active Problem List   Diagnosis Date Noted   Acute blood loss anemia 10/30/2022   Obesity (BMI 30-39.9) 10/30/2022   Iron deficiency anemia due to chronic blood loss 10/30/2022   PE (pulmonary thromboembolism) (HCC) 10/30/2022   Left adnexal lesion concerning for possible hydrosalpinx 10/30/2022   Umbilical mass 10/14/2022   Menorrhagia with irregular cycle 09/21/2022   Endometriosis, umbilicus 09/21/2022   Umbilical pain 09/21/2022   Preterm delivery 11/21/2011   Increased BMI 37.7  09/04/2011   Latex allergy 07/30/2011   Past Medical History:  Diagnosis Date   H/O rubella    H/O varicella    History of pregnancy induced hypertension    IDA (iron deficiency anemia)    Preeclampsia    h/o severe preeclampsia with pregnancies   Current Outpatient Medications on File Prior to Visit  Medication Sig Dispense Refill   apixaban (ELIQUIS) 5 MG TABS tablet Take 1 tablet (5 mg total) by mouth 2 (two) times daily. 60 tablet 6   cyanocobalamin (VITAMIN B12) 1000 MCG tablet Take 1 tablet (1,000 mcg total) by mouth daily. 130 tablet 0   ferrous sulfate 325 (65 FE) MG tablet Take 1 tablet (325 mg total) by mouth 3 (three) times daily with meals. 100 tablet 0   metroNIDAZOLE (FLAGYL) 500 MG tablet Take 1 tablet (500 mg total) by mouth 2 (two) times daily. 14 tablet 0   misoprostol (CYTOTEC) 100 MCG  tablet Place tablet buccally pm and am before procedure 2 tablet 0   norethindrone (AYGESTIN) 5 MG tablet Take 1 tablet (5 mg total) by mouth 2 (two) times daily. 60 tablet 3   RIVAROXABAN (XARELTO) VTE STARTER PACK (15 & 20 MG) Take 1 each by mouth once for 1 dose. Follow package directions: Take one 15mg  tablet by mouth twice a day. On day 22, switch to one 20mg  tablet once a day. Take with food. (Patient taking differently: Take 1 each by mouth See admin instructions. Follow package directions: Take one 15mg  tablet by mouth twice a day. On day 22, switch to one 20mg  tablet once a day. Take with food.) 51 each 0   No current facility-administered medications on file prior to visit.   Latex  Review of Systems  Constitutional: Negative.   Genitourinary:        Irregular spotting   Vitals:   01/14/23 0903  BP: 112/71  Pulse: 93  Weight: 236 lb 12.8 oz (107.4 kg)  Height: 5' 2.75" (1.594 m)    Gen:  WNWF healthy female NAD Abdomen: soft, non-tender Groin:  no inguinal nodes palpated  Pelvic exam: Vulva:  normal female genitalia Vagina:  normal vagina Cervix:  Non-tender, Negative CMT, no lesions or redness.  Procedure:  Speculum inserted.  Cervix visualized and cleansed with Betadine x 3.  Paracervical block was not placed.  Single toothed tenaculum applied  to anterior lip of cervix without difficulty.  Using endometrial curette, this was passed into the cervix and then rotated in clockwise motion and then removed.  Several passes were required but IUD was grasped with this device and removed intact.  Pt has some mild cramping but tolerated this well.  Minimal bleeding noted.  All instruments removed from vagina.  Assessment/Plan: 1. Encounter for IUD removal - IUD removed today  2. PE (pulmonary thromboembolism) (HCC) - followed by hematology.    3. On continuous oral anticoagulation - expecting to be on this for at least 6 months  4. History of menorrhagia - restart  norethindrone 1 tablets bid to hopefully help with bleeding when it restarts - encouraged pt to call if has heavy bleeding.

## 2023-01-27 ENCOUNTER — Other Ambulatory Visit (HOSPITAL_BASED_OUTPATIENT_CLINIC_OR_DEPARTMENT_OTHER): Payer: BC Managed Care – PPO

## 2023-01-27 ENCOUNTER — Ambulatory Visit (HOSPITAL_BASED_OUTPATIENT_CLINIC_OR_DEPARTMENT_OTHER): Payer: BC Managed Care – PPO | Admitting: Obstetrics & Gynecology

## 2023-02-18 ENCOUNTER — Other Ambulatory Visit (HOSPITAL_BASED_OUTPATIENT_CLINIC_OR_DEPARTMENT_OTHER): Payer: BC Managed Care – PPO

## 2023-02-18 ENCOUNTER — Ambulatory Visit (HOSPITAL_BASED_OUTPATIENT_CLINIC_OR_DEPARTMENT_OTHER): Payer: BC Managed Care – PPO | Admitting: Obstetrics & Gynecology

## 2023-02-18 VITALS — BP 127/79 | HR 92 | Ht 62.75 in | Wt 239.8 lb

## 2023-02-18 DIAGNOSIS — Z30430 Encounter for insertion of intrauterine contraceptive device: Secondary | ICD-10-CM

## 2023-02-18 DIAGNOSIS — I2699 Other pulmonary embolism without acute cor pulmonale: Secondary | ICD-10-CM

## 2023-02-18 DIAGNOSIS — N920 Excessive and frequent menstruation with regular cycle: Secondary | ICD-10-CM

## 2023-02-18 DIAGNOSIS — Z7901 Long term (current) use of anticoagulants: Secondary | ICD-10-CM

## 2023-02-18 MED ORDER — LEVONORGESTREL 20 MCG/DAY IU IUD
1.0000 | INTRAUTERINE_SYSTEM | Freq: Once | INTRAUTERINE | Status: AC
Start: 2023-02-18 — End: 2023-02-18
  Administered 2023-02-18: 1 via INTRAUTERINE

## 2023-03-06 NOTE — Progress Notes (Signed)
32 y.o. [id number] Married [place] American female presents for insertion of Mirena IUD due to menorrhagia.  This is being done with ultrasound guidance today due to prior malposition of IUD with it located in the lower uterus.  Pt initially desired removal without replacement but then realized how much bleeding improvement she was having so has decided to have this replaced.  Other then progesterone methods, she does not have any other options due to recent PE and current anti-coagulation.  Pt has also been counseled about risks and benefits as well as complications.  Consent is obtained today.  All questions answered prior to start of procedure.    Current contraception: no but no current pregnancy risk Last STD testing:  not indicate due to no concerns  Patient Active Problem List   Diagnosis Date Noted   Acute blood loss anemia 10/30/2022   Obesity (BMI 30-39.9) 10/30/2022   Iron deficiency anemia due to chronic blood loss 10/30/2022   PE (pulmonary thromboembolism) (HCC) 10/30/2022   Left adnexal lesion concerning for possible hydrosalpinx 10/30/2022   Umbilical mass 10/14/2022   Menorrhagia with irregular cycle 09/21/2022   Endometriosis, umbilicus 09/21/2022   Umbilical pain 09/21/2022   Preterm delivery 11/21/2011   Increased BMI 37.7  09/04/2011   Latex allergy 07/30/2011   Past Medical History:  Diagnosis Date   H/O rubella    H/O varicella    History of pregnancy induced hypertension    IDA (iron deficiency anemia)    Preeclampsia    h/o severe preeclampsia with pregnancies   Current Outpatient Medications on File Prior to Visit  Medication Sig Dispense Refill   apixaban (ELIQUIS) 5 MG TABS tablet Take 1 tablet (5 mg total) by mouth 2 (two) times daily. 60 tablet 6   cyanocobalamin (VITAMIN B12) 1000 MCG tablet Take 1 tablet (1,000 mcg total) by mouth daily. 130 tablet 0   ferrous sulfate 325 (65 FE) MG tablet Take 1 tablet (325 mg total) by mouth 3 (three) times daily with  meals. 100 tablet 0   metroNIDAZOLE (FLAGYL) 500 MG tablet Take 1 tablet (500 mg total) by mouth 2 (two) times daily. 14 tablet 0   norethindrone (AYGESTIN) 5 MG tablet Take 1 tablet (5 mg total) by mouth 2 (two) times daily. 60 tablet 3   RIVAROXABAN (XARELTO) VTE STARTER PACK (15 & 20 MG) Take 1 each by mouth once for 1 dose. Follow package directions: Take one 15mg  tablet by mouth twice a day. On day 22, switch to one 20mg  tablet once a day. Take with food. (Patient taking differently: Take 1 each by mouth See admin instructions. Follow package directions: Take one 15mg  tablet by mouth twice a day. On day 22, switch to one 20mg  tablet once a day. Take with food.) 51 each 0   No current facility-administered medications on file prior to visit.   Latex  Review of Systems  Constitutional: Negative.   Genitourinary:        Menorrhagia   Vitals:   02/18/23 1347  BP: 127/79  Pulse: 92  Weight: 239 lb 12.8 oz (108.8 kg)  Height: 5' 2.75" (1.594 m)    Gen:  WNWF healthy female NAD Abdomen: soft, non-tender Groin:  no inguinal nodes palpated  Pelvic exam: Vulva:  normal female genitalia Vagina:  normal vagina Cervix:  Non-tender, Negative CMT, no lesions or redness. Uterus:  enlarged size   Procedure:  Speculum reinserted.  Cervix visualized and cleansed with Betadine x 3.  Paracervical block was  not placed.  Single toothed tenaculum applied to anterior lip of cervix without difficulty.  Uterus sounded to 9cm.  This was done with ultrasound guidance.  IUD package was opened.  IUD and introducer passed to fundus and then withdrawn slightly before IUD was passed into endometrial cavity.  Introducer removed.  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  IUD was placed with ultrasound guidance in endometrial cavity and without difficulty.  Strings cut to 2cm.  Tenaculum removed from cervix.  Minimal bleeding  noted.  Pt tolerated the procedure well.  All instruments removed from vagina.  Assessment/Plan: 1. Encounter for insertion of Mirena IUD - Return for recheck 6-8 weeks - Pt aware to call for any concerns - Pt aware removal due no later than 5 years from today.  IUD card given to pt. - levonorgestrel (MIRENA) 20 MCG/DAY IUD 1 each  2. Menorrhagia with regular cycle  3. PE (pulmonary thromboembolism) (HCC)  4. Anticoagulation adequate

## 2023-03-31 ENCOUNTER — Inpatient Hospital Stay: Payer: BC Managed Care – PPO | Attending: Hematology and Oncology | Admitting: Hematology and Oncology

## 2023-03-31 VITALS — BP 129/69 | HR 87 | Temp 97.5°F | Resp 16 | Wt 242.0 lb

## 2023-03-31 DIAGNOSIS — Z7901 Long term (current) use of anticoagulants: Secondary | ICD-10-CM | POA: Insufficient documentation

## 2023-03-31 DIAGNOSIS — Z86711 Personal history of pulmonary embolism: Secondary | ICD-10-CM | POA: Insufficient documentation

## 2023-03-31 DIAGNOSIS — Z8759 Personal history of other complications of pregnancy, childbirth and the puerperium: Secondary | ICD-10-CM | POA: Diagnosis not present

## 2023-03-31 DIAGNOSIS — Z809 Family history of malignant neoplasm, unspecified: Secondary | ICD-10-CM | POA: Diagnosis not present

## 2023-03-31 DIAGNOSIS — D509 Iron deficiency anemia, unspecified: Secondary | ICD-10-CM | POA: Insufficient documentation

## 2023-03-31 DIAGNOSIS — Z87891 Personal history of nicotine dependence: Secondary | ICD-10-CM | POA: Diagnosis not present

## 2023-03-31 DIAGNOSIS — D6862 Lupus anticoagulant syndrome: Secondary | ICD-10-CM | POA: Diagnosis not present

## 2023-03-31 DIAGNOSIS — Z793 Long term (current) use of hormonal contraceptives: Secondary | ICD-10-CM | POA: Diagnosis not present

## 2023-03-31 DIAGNOSIS — Z86718 Personal history of other venous thrombosis and embolism: Secondary | ICD-10-CM | POA: Diagnosis not present

## 2023-03-31 DIAGNOSIS — I2699 Other pulmonary embolism without acute cor pulmonale: Secondary | ICD-10-CM | POA: Diagnosis not present

## 2023-03-31 NOTE — Progress Notes (Signed)
[facility] CONSULT NOTE  Patient Care Team: Patient, No Pcp Per as PCP - General (General Practice) Dillard, Samule Ohm, MD (Obstetrics and Gynecology)  CHIEF COMPLAINTS/PURPOSE OF CONSULTATION:  IDA and recent PE  ASSESSMENT & PLAN:   This is a very pleasant 32 year old female patient with new diagnosis of iron deficiency anemia and recent pulmonary embolism referred to hematology for additional recommendations.  History of PE Patient is currently on Eliquis. Discussed the possibility of discontinuing Eliquis after 6 months if lupus anticoagulant test is negative. Patient expressed preference to continue anticoagulation due to fear of recurrent clot. Discussed potential switch to warfarin due to cost considerations. -Stop Eliquis on 04/24/2023. -Perform lupus anticoagulant test and iron studies on 05/04/2023. -Telephone consultation on 05/18/2023 to discuss results and plan. -If she has persistent lupus anticoagulant despite discontinuation of Eliquis, then she may benefit from switching to warfarin since this has been noted to be superior to DOAC's in antiphospholipid antibody syndrome.  Patient is agreeable.  Iron Supplementation Patient is currently taking iron pills. No changes discussed. -Continue current iron supplementation. -CBC, iron panel, ferritin ordered for her next lab.  Planned Surgery Depending on lupus anticoagulant results, we will discuss bridging recommendations for the surgery.  HISTORY OF PRESENTING ILLNESS:  Chloe Graves 32 y.o. female is here because of IDA and PE  This is a very pleasant 32 yr pre menopausal female patient with recent ED admission for severe anemia and incidental finding of PE referred to hematology for the same. She is here for telephone visit to review hypercoagulable workup results.  Discussed the use of AI scribe software for clinical note transcription with the patient, who gave verbal consent to proceed.  History of  Present Illness    The patient, with a history of blood clots, presents with tingling and numbness on her left side, especially at night. She reports that the symptoms are so severe that she can't get comfortable at night.  She otherwise denies any lower extremity swelling, chest pain, shortness of breath.  She also reports some difficulty with her menstrual cycles when she had her Mirena removed hence had it reinserted.  The patient is currently on Eliquis, a blood thinner, and iron pills. She also has a mass in her belly button and fibroids that need to be removed. The patient is considering switching from Eliquis to warfarin due to the cost of Eliquis.  REVIEW OF SYSTEMS:   Constitutional: Denies fevers, chills or abnormal night sweats Eyes: Denies blurriness of vision, double vision or watery eyes Ears, nose, mouth, throat, and face: Denies mucositis or sore throat Respiratory: Denies cough, dyspnea or wheezes Cardiovascular: Denies palpitation, chest discomfort or lower extremity swelling Gastrointestinal:  Denies nausea, heartburn or change in bowel habits Skin: Denies abnormal skin rashes Lymphatics: Denies new lymphadenopathy or easy bruising Neurological:Denies numbness, tingling or new weaknesses Behavioral/Psych: Mood is stable, no new changes  All other systems were reviewed with the patient and are negative.  MEDICAL HISTORY:  Past Medical History:  Diagnosis Date   H/O rubella    H/O varicella    History of pregnancy induced hypertension    IDA (iron deficiency anemia)    Preeclampsia    h/o severe preeclampsia with pregnancies    SURGICAL HISTORY: Past Surgical History:  Procedure Laterality Date   CESAREAN SECTION  11/16/2011   Procedure: CESAREAN SECTION;  Surgeon: Michael Litter, MD;  Location: [facility];  Service: Gynecology;  Laterality: N/A;  Primary cesarean section with  delivery of baby boy at 59.  Apgars 6/8.   CESAREAN SECTION N/A 07/14/2018   Procedure:  CESAREAN SECTION;  Surgeon: Maxie Better, MD;  Location: [facility];  Service: Obstetrics;  Laterality: N/A;   LAPAROSCOPIC TUBAL LIGATION Bilateral 12/06/2018   Procedure: LAPAROSCOPIC TUBAL LIGATION With Bipolar Cautery;  Surgeon: Maxie Better, MD;  Location: [facility];  Service: Gynecology;  Laterality: Bilateral;   WISDOM TOOTH EXTRACTION      SOCIAL HISTORY: Social History   Socioeconomic History   Marital status: Married    Spouse name: Not on file   Number of children: Not on file   Years of education: Not on file   Highest education level: Not on file  Occupational History   Not on file  Tobacco Use   Smoking status: Former    Current packs/day: 0.00    Types: Cigarettes    Start date: 12/02/2015    Quit date: 12/01/2016    Years since quitting: 6.3   Smokeless tobacco: Never  Vaping Use   Vaping status: Never Used  Substance and Sexual Activity   Alcohol use: Yes    Alcohol/week: 1.0 standard drink of alcohol    Types: 1 Standard drinks or equivalent per week    Comment: occasional   Drug use: Never   Sexual activity: Yes    Birth control/protection: Surgical  Other Topics Concern   Not on file  Social History Narrative   ** Merged History Encounter **       Social Determinants of Health   Financial Resource Strain: Not on file  Food Insecurity: Not on file  Transportation Needs: Not on file  Physical Activity: Not on file  Stress: Not on file  Social Connections: Not on file  Intimate Partner Violence: Not on file    FAMILY HISTORY: Family History  Problem Relation Age of Onset   Cancer Maternal Aunt    Cancer Maternal Grandmother     ALLERGIES:  is allergic to latex.  MEDICATIONS:  Current Outpatient Medications  Medication Sig Dispense Refill   apixaban (ELIQUIS) 5 MG TABS tablet Take 1 tablet (5 mg total) by mouth 2 (two) times daily. 60 tablet 6   cyanocobalamin (VITAMIN B12) 1000 MCG tablet Take 1 tablet (1,000  mcg total) by mouth daily. 130 tablet 0   ferrous sulfate 325 (65 FE) MG tablet Take 1 tablet (325 mg total) by mouth 3 (three) times daily with meals. 100 tablet 0   metroNIDAZOLE (FLAGYL) 500 MG tablet Take 1 tablet (500 mg total) by mouth 2 (two) times daily. 14 tablet 0   norethindrone (AYGESTIN) 5 MG tablet Take 1 tablet (5 mg total) by mouth 2 (two) times daily. 60 tablet 3   RIVAROXABAN (XARELTO) VTE STARTER PACK (15 & 20 MG) Take 1 each by mouth once for 1 dose. Follow package directions: Take one 15mg  tablet by mouth twice a day. On day 22, switch to one 20mg  tablet once a day. Take with food. (Patient taking differently: Take 1 each by mouth See admin instructions. Follow package directions: Take one 15mg  tablet by mouth twice a day. On day 22, switch to one 20mg  tablet once a day. Take with food.) 51 each 0   No current facility-administered medications for this visit.     PHYSICAL EXAMINATION: ECOG PERFORMANCE STATUS: 0 - Asymptomatic  Vitals:   03/31/23 1139  BP: 129/69  Pulse: 87  Resp: 16  Temp: (!) 97.5 F (36.4 C)  SpO2:  100%    Filed Weights   03/31/23 1139  Weight: 242 lb (109.8 kg)   Physical Exam Constitutional:      Appearance: Normal appearance.  Cardiovascular:     Rate and Rhythm: Normal rate and regular rhythm.     Pulses: Normal pulses.     Heart sounds: Normal heart sounds.  Pulmonary:     Effort: Pulmonary effort is normal.     Breath sounds: Normal breath sounds.  Musculoskeletal:        General: Normal range of motion.     Cervical back: Normal range of motion and neck supple. No rigidity.  Lymphadenopathy:     Cervical: No cervical adenopathy.  Skin:    General: Skin is warm and dry.  Neurological:     General: No focal deficit present.     Mental Status: She is alert.  Psychiatric:        Mood and Affect: Mood normal.     Lab Results  Component Value Date   WBC 8.5 11/06/2022   HGB 11.6 (L) 11/06/2022   HCT 35.6 (L) 11/06/2022    MCV 84.6 11/06/2022   PLT 491.0 (H) 11/06/2022     Chemistry      Component Value Date/Time   NA 137 11/06/2022 1348   K 4.2 11/06/2022 1348   CL 108 11/06/2022 1348   CO2 20 11/06/2022 1348   BUN 11 11/06/2022 1348   CREATININE 0.94 11/06/2022 1348   CREATININE 0.94 11/21/2011 2115      Component Value Date/Time   CALCIUM 9.3 11/06/2022 1348   ALKPHOS 30 (L) 10/30/2022 1148   AST 15 10/30/2022 1148   ALT 15 10/30/2022 1148   BILITOT 0.2 (L) 10/30/2022 1148       RADIOGRAPHIC STUDIES: I have personally reviewed the radiological images as listed and agreed with the findings in the report. No results found.  All questions were answered. The patient knows to call the clinic with any problems, questions or concerns.     Rachel Moulds, MD 03/31/2023 11:45 AM

## 2023-05-04 ENCOUNTER — Inpatient Hospital Stay: Payer: BC Managed Care – PPO | Attending: Hematology and Oncology

## 2023-05-04 DIAGNOSIS — Z86711 Personal history of pulmonary embolism: Secondary | ICD-10-CM | POA: Diagnosis not present

## 2023-05-04 DIAGNOSIS — Z79899 Other long term (current) drug therapy: Secondary | ICD-10-CM | POA: Diagnosis not present

## 2023-05-04 DIAGNOSIS — Z87891 Personal history of nicotine dependence: Secondary | ICD-10-CM | POA: Diagnosis not present

## 2023-05-04 DIAGNOSIS — Z8759 Personal history of other complications of pregnancy, childbirth and the puerperium: Secondary | ICD-10-CM | POA: Diagnosis not present

## 2023-05-04 DIAGNOSIS — Z9851 Tubal ligation status: Secondary | ICD-10-CM | POA: Diagnosis not present

## 2023-05-04 DIAGNOSIS — I2699 Other pulmonary embolism without acute cor pulmonale: Secondary | ICD-10-CM

## 2023-05-04 DIAGNOSIS — Z793 Long term (current) use of hormonal contraceptives: Secondary | ICD-10-CM | POA: Diagnosis not present

## 2023-05-04 DIAGNOSIS — Z809 Family history of malignant neoplasm, unspecified: Secondary | ICD-10-CM | POA: Insufficient documentation

## 2023-05-04 DIAGNOSIS — D509 Iron deficiency anemia, unspecified: Secondary | ICD-10-CM | POA: Insufficient documentation

## 2023-05-04 LAB — CBC WITH DIFFERENTIAL/PLATELET
Abs Immature Granulocytes: 0.01 10*3/uL (ref 0.00–0.07)
Basophils Absolute: 0 10*3/uL (ref 0.0–0.1)
Basophils Relative: 0 %
Eosinophils Absolute: 0.1 10*3/uL (ref 0.0–0.5)
Eosinophils Relative: 2 %
HCT: 41.3 % (ref 36.0–46.0)
Hemoglobin: 14.1 g/dL (ref 12.0–15.0)
Immature Granulocytes: 0 %
Lymphocytes Relative: 38 %
Lymphs Abs: 1.8 10*3/uL (ref 0.7–4.0)
MCH: 29.6 pg (ref 26.0–34.0)
MCHC: 34.1 g/dL (ref 30.0–36.0)
MCV: 86.8 fL (ref 80.0–100.0)
Monocytes Absolute: 0.3 10*3/uL (ref 0.1–1.0)
Monocytes Relative: 7 %
Neutro Abs: 2.5 10*3/uL (ref 1.7–7.7)
Neutrophils Relative %: 53 %
Platelets: 300 10*3/uL (ref 150–400)
RBC: 4.76 MIL/uL (ref 3.87–5.11)
RDW: 14.1 % (ref 11.5–15.5)
WBC: 4.7 10*3/uL (ref 4.0–10.5)
nRBC: 0 % (ref 0.0–0.2)

## 2023-05-04 LAB — IRON AND IRON BINDING CAPACITY (CC-WL,HP ONLY)
Iron: 102 ug/dL (ref 28–170)
Saturation Ratios: 31 % (ref 10.4–31.8)
TIBC: 328 ug/dL (ref 250–450)
UIBC: 226 ug/dL (ref 148–442)

## 2023-05-04 LAB — FERRITIN: Ferritin: 19 ng/mL (ref 11–307)

## 2023-05-05 LAB — LUPUS ANTICOAGULANT PANEL
DRVVT: 45.1 s (ref 0.0–47.0)
PTT Lupus Anticoagulant: 41.9 s (ref 0.0–43.5)

## 2023-05-18 ENCOUNTER — Inpatient Hospital Stay (HOSPITAL_BASED_OUTPATIENT_CLINIC_OR_DEPARTMENT_OTHER): Payer: BC Managed Care – PPO | Admitting: Hematology and Oncology

## 2023-05-18 DIAGNOSIS — D509 Iron deficiency anemia, unspecified: Secondary | ICD-10-CM

## 2023-05-18 DIAGNOSIS — I2699 Other pulmonary embolism without acute cor pulmonale: Secondary | ICD-10-CM

## 2023-05-18 NOTE — Progress Notes (Signed)
[name] CONSULT NOTE  Patient Care Team: Patient, No Pcp Per as PCP - General (General Practice) Dillard, Samule Ohm, MD (Obstetrics and Gynecology)  CHIEF COMPLAINTS/PURPOSE OF CONSULTATION:  IDA and recent PE  ASSESSMENT & PLAN:   This is a very pleasant 32 year old female patient with new diagnosis of iron deficiency anemia and recent pulmonary embolism referred to hematology for additional recommendations. She is here for a telephone visit   History of PE Patient completed eliquis for a total of 6 months. Repeat LA testing neg,  No further indication for anticoagulation. Hypercoag work up neg. Consider indefinite anticoagulation if she has another episode of DVT/PE.   Iron Supplementation Patient is currently taking iron pills TID.  Most recent labs with downtrending ferritin, normal hemoglobin.  Since she is already taking iron supplementation thrice a day and there has been no significant improvement of her ferritin, I would consider IV iron supplementation again.  She is now back on Mirena for control of her menorrhagia and hopefully this decreases the severity of anemia in the future.  She is agreeable to these recommendations.  She will return to clinic in 6 months with labs. HISTORY OF PRESENTING ILLNESS:  Chloe Graves 32 y.o. female is here because of IDA and PE  This is a very pleasant 32 yr pre menopausal female patient with recent ED admission for severe anemia and incidental finding of PE referred to hematology for the same. She is here for telephone visit to review hypercoagulable workup results.  Discussed the use of AI scribe software for clinical note transcription with the patient, who gave verbal consent to proceed.  History of Present Illness    She is here for telephone visit to follow-up on her lupus anticoagulant workup as well as her iron panel and ferritin.  She is doing well otherwise.  She had a good Christmas.  REVIEW OF SYSTEMS:    Constitutional: Denies fevers, chills or abnormal night sweats Eyes: Denies blurriness of vision, double vision or watery eyes Ears, nose, mouth, throat, and face: Denies mucositis or sore throat Respiratory: Denies cough, dyspnea or wheezes Cardiovascular: Denies palpitation, chest discomfort or lower extremity swelling Gastrointestinal:  Denies nausea, heartburn or change in bowel habits Skin: Denies abnormal skin rashes Lymphatics: Denies new lymphadenopathy or easy bruising Neurological:Denies numbness, tingling or new weaknesses Behavioral/Psych: Mood is stable, no new changes  All other systems were reviewed with the patient and are negative.  MEDICAL HISTORY:  Past Medical History:  Diagnosis Date   H/O rubella    H/O varicella    History of pregnancy induced hypertension    IDA (iron deficiency anemia)    Preeclampsia    h/o severe preeclampsia with pregnancies    SURGICAL HISTORY: Past Surgical History:  Procedure Laterality Date   CESAREAN SECTION  11/16/2011   Procedure: CESAREAN SECTION;  Surgeon: Michael Litter, MD;  Location: [facility];  Service: Gynecology;  Laterality: N/A;  Primary cesarean section with delivery of baby boy at 60.  Apgars 6/8.   CESAREAN SECTION N/A 07/14/2018   Procedure: CESAREAN SECTION;  Surgeon: Maxie Better, MD;  Location: [facility];  Service: Obstetrics;  Laterality: N/A;   LAPAROSCOPIC TUBAL LIGATION Bilateral 12/06/2018   Procedure: LAPAROSCOPIC TUBAL LIGATION With Bipolar Cautery;  Surgeon: Maxie Better, MD;  Location: [facility];  Service: Gynecology;  Laterality: Bilateral;   WISDOM TOOTH EXTRACTION      SOCIAL HISTORY: Social History   Socioeconomic History   Marital  status: Married    Spouse name: Not on file   Number of children: Not on file   Years of education: Not on file   Highest education level: Not on file  Occupational History   Not on file  Tobacco Use   Smoking status: Former     Current packs/day: 0.00    Types: Cigarettes    Start date: 12/02/2015    Quit date: 12/01/2016    Years since quitting: 6.4   Smokeless tobacco: Never  Vaping Use   Vaping status: Never Used  Substance and Sexual Activity   Alcohol use: Yes    Alcohol/week: 1.0 standard drink of alcohol    Types: 1 Standard drinks or equivalent per week    Comment: occasional   Drug use: Never   Sexual activity: Yes    Birth control/protection: Surgical  Other Topics Concern   Not on file  Social History Narrative   ** Merged History Encounter **       Social Drivers of Corporate investment banker Strain: Not on file  Food Insecurity: Not on file  Transportation Needs: Not on file  Physical Activity: Not on file  Stress: Not on file  Social Connections: Not on file  Intimate Partner Violence: Not on file    FAMILY HISTORY: Family History  Problem Relation Age of Onset   Cancer Maternal Aunt    Cancer Maternal Grandmother     ALLERGIES:  is allergic to latex.  MEDICATIONS:  Current Outpatient Medications  Medication Sig Dispense Refill   apixaban (ELIQUIS) 5 MG TABS tablet Take 1 tablet (5 mg total) by mouth 2 (two) times daily. 60 tablet 6   cyanocobalamin (VITAMIN B12) 1000 MCG tablet Take 1 tablet (1,000 mcg total) by mouth daily. 130 tablet 0   ferrous sulfate 325 (65 FE) MG tablet Take 1 tablet (325 mg total) by mouth 3 (three) times daily with meals. 100 tablet 0   metroNIDAZOLE (FLAGYL) 500 MG tablet Take 1 tablet (500 mg total) by mouth 2 (two) times daily. 14 tablet 0   norethindrone (AYGESTIN) 5 MG tablet Take 1 tablet (5 mg total) by mouth 2 (two) times daily. 60 tablet 3   RIVAROXABAN (XARELTO) VTE STARTER PACK (15 & 20 MG) Take 1 each by mouth once for 1 dose. Follow package directions: Take one 15mg  tablet by mouth twice a day. On day 22, switch to one 20mg  tablet once a day. Take with food. (Patient taking differently: Take 1 each by mouth See admin instructions. Follow  package directions: Take one 15mg  tablet by mouth twice a day. On day 22, switch to one 20mg  tablet once a day. Take with food.) 51 each 0   No current facility-administered medications for this visit.     PHYSICAL EXAMINATION: ECOG PERFORMANCE STATUS: 0 - Asymptomatic  There were no vitals filed for this visit.   There were no vitals filed for this visit.  Physical Exam Constitutional:      Appearance: Normal appearance.  Cardiovascular:     Rate and Rhythm: Normal rate and regular rhythm.     Pulses: Normal pulses.     Heart sounds: Normal heart sounds.  Pulmonary:     Effort: Pulmonary effort is normal.     Breath sounds: Normal breath sounds.  Musculoskeletal:        General: Normal range of motion.     Cervical back: Normal range of motion and neck supple. No rigidity.  Lymphadenopathy:  Cervical: No cervical adenopathy.  Skin:    General: Skin is warm and dry.  Neurological:     General: No focal deficit present.     Mental Status: She is alert.  Psychiatric:        Mood and Affect: Mood normal.     Lab Results  Component Value Date   WBC 4.7 05/04/2023   HGB 14.1 05/04/2023   HCT 41.3 05/04/2023   MCV 86.8 05/04/2023   PLT 300 05/04/2023     Chemistry      Component Value Date/Time   NA 137 11/06/2022 1348   K 4.2 11/06/2022 1348   CL 108 11/06/2022 1348   CO2 20 11/06/2022 1348   BUN 11 11/06/2022 1348   CREATININE 0.94 11/06/2022 1348   CREATININE 0.94 11/21/2011 2115      Component Value Date/Time   CALCIUM 9.3 11/06/2022 1348   ALKPHOS 30 (L) 10/30/2022 1148   AST 15 10/30/2022 1148   ALT 15 10/30/2022 1148   BILITOT 0.2 (L) 10/30/2022 1148       RADIOGRAPHIC STUDIES: I have personally reviewed the radiological images as listed and agreed with the findings in the report. No results found.  All questions were answered. The patient knows to call the clinic with any problems, questions or concerns.  I connected with  Clovis Fredrickson on 05/18/23 by a telephone application and verified that I am speaking with the correct person using two identifiers.   I discussed the limitations of evaluation and management by telemedicine. The patient expressed understanding and agreed to proceed.  Time spent: 11 min    Rachel Moulds, MD 05/18/2023 9:03 AM

## 2023-05-27 ENCOUNTER — Encounter (HOSPITAL_BASED_OUTPATIENT_CLINIC_OR_DEPARTMENT_OTHER): Payer: Self-pay | Admitting: Obstetrics & Gynecology

## 2023-05-27 ENCOUNTER — Other Ambulatory Visit: Payer: Self-pay | Admitting: Family Medicine

## 2023-05-27 ENCOUNTER — Ambulatory Visit (HOSPITAL_BASED_OUTPATIENT_CLINIC_OR_DEPARTMENT_OTHER): Payer: BC Managed Care – PPO | Admitting: Obstetrics & Gynecology

## 2023-05-27 VITALS — BP 117/72 | HR 71 | Ht 62.0 in | Wt 247.6 lb

## 2023-05-27 DIAGNOSIS — I2699 Other pulmonary embolism without acute cor pulmonale: Secondary | ICD-10-CM | POA: Diagnosis not present

## 2023-05-27 DIAGNOSIS — D500 Iron deficiency anemia secondary to blood loss (chronic): Secondary | ICD-10-CM | POA: Diagnosis not present

## 2023-05-27 DIAGNOSIS — Z98891 History of uterine scar from previous surgery: Secondary | ICD-10-CM

## 2023-05-27 DIAGNOSIS — N921 Excessive and frequent menstruation with irregular cycle: Secondary | ICD-10-CM

## 2023-05-27 DIAGNOSIS — Z6841 Body Mass Index (BMI) 40.0 and over, adult: Secondary | ICD-10-CM

## 2023-05-27 DIAGNOSIS — I2693 Single subsegmental pulmonary embolism without acute cor pulmonale: Secondary | ICD-10-CM

## 2023-05-27 DIAGNOSIS — R1909 Other intra-abdominal and pelvic swelling, mass and lump: Secondary | ICD-10-CM

## 2023-05-27 NOTE — Progress Notes (Signed)
 GYNECOLOGY  VISIT  CC:   IUD follow-up, fibroid uterus  HPI: 33 y.o. G3P0212 Married Black or African American female here for discussion of bleeding and fibroids.  She is really ready to proceed with hysterectomy.  She did stop her Eliquis  on Dec 6th.  Shortly after that, she stopped the oral progesterone.  The IUD was spontaneously expelled on Jan 5.  She has been bleeding since Dec 24th.  It started at spotting then but increased on 12/28.  She had blood work on 12/16.  Hb was good but ferritin was 16.  Dr. Loretha has recommended IV iron  and she is scheduled for three of these.    She is very desirous of definitive surgery.  Advised I will need to reach out to Dr. Loretha for her recommendations of when to proceed.  We will need perioperative lovenox  use and after procedure as well.  Laparoscopic approach discussed.  Overview of hospital stay, risks, and restrictions post op all discussed.  Questions answered.     Past Medical History:  Diagnosis Date   H/O rubella    H/O varicella    History of pregnancy induced hypertension    IDA (iron  deficiency anemia)    Preeclampsia    h/o severe preeclampsia with pregnancies    MEDS:   Current Outpatient Medications on File Prior to Visit  Medication Sig Dispense Refill   cyanocobalamin  (VITAMIN B12) 1000 MCG tablet Take 1 tablet (1,000 mcg total) by mouth daily. 130 tablet 0   ferrous sulfate  325 (65 FE) MG tablet Take 1 tablet (325 mg total) by mouth 3 (three) times daily with meals. 100 tablet 0   apixaban  (ELIQUIS ) 5 MG TABS tablet Take 1 tablet (5 mg total) by mouth 2 (two) times daily. (Patient not taking: Reported on 05/27/2023) 60 tablet 6   No current facility-administered medications on file prior to visit.    ALLERGIES: Latex  SH:  married, non smoker  Review of Systems  Constitutional: Negative.   Genitourinary:        Heavy and prolonged bleeding    PHYSICAL EXAMINATION:    BP 117/72 (BP Location: Left Arm, Patient  Position: Sitting, Cuff Size: Large)   Pulse 71   Ht 5' 2 (1.575 m) Comment: Reported  Wt 247 lb 9.6 oz (112.3 kg)   BMI 45.29 kg/m     Physical Exam Constitutional:      Appearance: Normal appearance.  Neurological:     General: No focal deficit present.     Mental Status: She is alert.  Psychiatric:        Mood and Affect: Mood normal.     Assessment/Plan: 1. Menorrhagia with irregular cycle (Primary) - message sent to Dr. Loretha about possible surgical planning  2. Iron  deficiency anemia due to chronic blood loss - she currently does not have anemia but iron  deficiency.  IV iron  has been ordered.  3. BMI 45.0-49.9, adult (HCC)  4. PE (pulmonary thromboembolism) (HCC) - off Eliquis   5. Umbilical mass - will need to involve general surgery for combined procedure.  She saw Dr. Ebbie in 2024.  6. History of cesarean delivery    Total with pt:  18 minutes Documentation time:  4 minutes Total time:  22 minutes

## 2023-06-08 ENCOUNTER — Encounter (HOSPITAL_BASED_OUTPATIENT_CLINIC_OR_DEPARTMENT_OTHER): Payer: Self-pay | Admitting: Obstetrics & Gynecology

## 2023-06-09 ENCOUNTER — Other Ambulatory Visit (HOSPITAL_BASED_OUTPATIENT_CLINIC_OR_DEPARTMENT_OTHER): Payer: Self-pay | Admitting: Obstetrics & Gynecology

## 2023-06-09 DIAGNOSIS — D500 Iron deficiency anemia secondary to blood loss (chronic): Secondary | ICD-10-CM

## 2023-06-09 DIAGNOSIS — R1909 Other intra-abdominal and pelvic swelling, mass and lump: Secondary | ICD-10-CM

## 2023-06-09 DIAGNOSIS — N921 Excessive and frequent menstruation with irregular cycle: Secondary | ICD-10-CM

## 2023-06-09 DIAGNOSIS — Z6841 Body Mass Index (BMI) 40.0 and over, adult: Secondary | ICD-10-CM

## 2023-06-09 DIAGNOSIS — Z98891 History of uterine scar from previous surgery: Secondary | ICD-10-CM

## 2023-06-10 ENCOUNTER — Inpatient Hospital Stay: Payer: BC Managed Care – PPO | Attending: Hematology and Oncology

## 2023-06-10 VITALS — BP 101/76 | HR 73 | Temp 98.2°F | Resp 18

## 2023-06-10 DIAGNOSIS — Z79899 Other long term (current) drug therapy: Secondary | ICD-10-CM | POA: Insufficient documentation

## 2023-06-10 DIAGNOSIS — D509 Iron deficiency anemia, unspecified: Secondary | ICD-10-CM | POA: Insufficient documentation

## 2023-06-10 DIAGNOSIS — D62 Acute posthemorrhagic anemia: Secondary | ICD-10-CM

## 2023-06-10 MED ORDER — SODIUM CHLORIDE 0.9 % IV SOLN: INTRAVENOUS | Status: DC

## 2023-06-10 MED ORDER — SODIUM CHLORIDE 0.9 % IV SOLN
300.0000 mg | Freq: Once | INTRAVENOUS | Status: AC
  Administered 2023-06-10: 300 mg via INTRAVENOUS
  Filled 2023-06-10: qty 300

## 2023-06-10 NOTE — Progress Notes (Signed)
Pt observed for 30 minutes post Venofer infusion. Tolerated well. VSS at time of discharge.

## 2023-06-10 NOTE — Patient Instructions (Signed)

## 2023-06-17 ENCOUNTER — Inpatient Hospital Stay: Payer: BC Managed Care – PPO

## 2023-06-17 VITALS — BP 120/84 | HR 69 | Temp 98.7°F | Resp 16

## 2023-06-17 DIAGNOSIS — Z79899 Other long term (current) drug therapy: Secondary | ICD-10-CM | POA: Diagnosis not present

## 2023-06-17 DIAGNOSIS — D509 Iron deficiency anemia, unspecified: Secondary | ICD-10-CM | POA: Diagnosis not present

## 2023-06-17 DIAGNOSIS — D62 Acute posthemorrhagic anemia: Secondary | ICD-10-CM

## 2023-06-17 MED ORDER — SODIUM CHLORIDE 0.9 % IV SOLN: INTRAVENOUS | Status: DC

## 2023-06-17 MED ORDER — IRON SUCROSE 20 MG/ML IV SOLN
300.0000 mg | Freq: Once | INTRAVENOUS | Status: AC
  Administered 2023-06-17: 300 mg via INTRAVENOUS
  Filled 2023-06-17: qty 300

## 2023-06-17 NOTE — Patient Instructions (Signed)

## 2023-06-24 ENCOUNTER — Inpatient Hospital Stay: Payer: BC Managed Care – PPO | Attending: Hematology and Oncology

## 2023-06-24 VITALS — BP 104/61 | HR 93 | Temp 98.1°F | Resp 18

## 2023-06-24 DIAGNOSIS — Z79899 Other long term (current) drug therapy: Secondary | ICD-10-CM | POA: Diagnosis not present

## 2023-06-24 DIAGNOSIS — D509 Iron deficiency anemia, unspecified: Secondary | ICD-10-CM | POA: Diagnosis not present

## 2023-06-24 DIAGNOSIS — D62 Acute posthemorrhagic anemia: Secondary | ICD-10-CM

## 2023-06-24 MED ORDER — SODIUM CHLORIDE 0.9 % IV SOLN
INTRAVENOUS | Status: DC
Start: 2023-06-24 — End: 2023-06-24

## 2023-06-24 MED ORDER — IRON SUCROSE 20 MG/ML IV SOLN
300.0000 mg | Freq: Once | INTRAVENOUS | Status: AC
  Administered 2023-06-24: 300 mg via INTRAVENOUS
  Filled 2023-06-24: qty 5

## 2023-06-24 NOTE — Patient Instructions (Signed)

## 2023-06-30 DIAGNOSIS — F17220 Nicotine dependence, chewing tobacco, uncomplicated: Secondary | ICD-10-CM | POA: Diagnosis not present

## 2023-07-03 ENCOUNTER — Encounter: Payer: Self-pay | Admitting: Hematology and Oncology

## 2023-07-03 ENCOUNTER — Telehealth: Payer: Self-pay

## 2023-07-03 NOTE — Telephone Encounter (Signed)
Called patient to schedule surgery w/ Dr. Hyacinth Meeker & Dr. Dwain Sarna. Patient surgery is scheduled for 08/25/23 @1 :30 pm at [facility]. Patient agreed to arrive at the hospital at 11:30 am. I provided pre-op instructions and surgery details by phone. Written details will be sent to patients Mychart.

## 2023-07-06 ENCOUNTER — Encounter (HOSPITAL_BASED_OUTPATIENT_CLINIC_OR_DEPARTMENT_OTHER): Payer: Self-pay

## 2023-07-21 DIAGNOSIS — E6609 Other obesity due to excess calories: Secondary | ICD-10-CM | POA: Diagnosis not present

## 2023-08-07 ENCOUNTER — Other Ambulatory Visit (HOSPITAL_BASED_OUTPATIENT_CLINIC_OR_DEPARTMENT_OTHER): Payer: Self-pay | Admitting: Obstetrics & Gynecology

## 2023-08-07 ENCOUNTER — Other Ambulatory Visit (HOSPITAL_COMMUNITY)
Admission: RE | Admit: 2023-08-07 | Discharge: 2023-08-07 | Disposition: A | Discharge status: Home / Self Care | Source: Ambulatory Visit | Attending: Obstetrics & Gynecology | Admitting: Obstetrics & Gynecology

## 2023-08-07 ENCOUNTER — Encounter (HOSPITAL_BASED_OUTPATIENT_CLINIC_OR_DEPARTMENT_OTHER): Payer: Self-pay | Admitting: Obstetrics & Gynecology

## 2023-08-07 ENCOUNTER — Ambulatory Visit (HOSPITAL_BASED_OUTPATIENT_CLINIC_OR_DEPARTMENT_OTHER): Payer: BC Managed Care – PPO | Admitting: Obstetrics & Gynecology

## 2023-08-07 VITALS — BP 111/67 | HR 71 | Ht 62.75 in | Wt 245.8 lb

## 2023-08-07 DIAGNOSIS — Z01818 Encounter for other preprocedural examination: Secondary | ICD-10-CM

## 2023-08-07 DIAGNOSIS — N921 Excessive and frequent menstruation with irregular cycle: Secondary | ICD-10-CM | POA: Insufficient documentation

## 2023-08-07 DIAGNOSIS — Z8742 Personal history of other diseases of the female genital tract: Secondary | ICD-10-CM

## 2023-08-07 DIAGNOSIS — D251 Intramural leiomyoma of uterus: Secondary | ICD-10-CM | POA: Insufficient documentation

## 2023-08-07 DIAGNOSIS — I2699 Other pulmonary embolism without acute cor pulmonale: Secondary | ICD-10-CM

## 2023-08-07 DIAGNOSIS — R1909 Other intra-abdominal and pelvic swelling, mass and lump: Secondary | ICD-10-CM

## 2023-08-07 DIAGNOSIS — Z91040 Latex allergy status: Secondary | ICD-10-CM

## 2023-08-07 DIAGNOSIS — N920 Excessive and frequent menstruation with regular cycle: Secondary | ICD-10-CM | POA: Diagnosis not present

## 2023-08-07 NOTE — Progress Notes (Signed)
 33 y.o. [id number] Married [place] or [place] American female here for discussion of upcoming procedure.  TLH with bilateral salpingectomy/cystoscopy, possible oophorectomy planned due to menorrhagia, h/o iron deficiency anemia and fibroid uterus.  Pt has umbilical endometriosis that is going to be treated by Dr. Harden Mo at the same time.  Last ultrasound was in August, 2024.  At that time, she was having very heavy bleeding due to being on blood thinners.  IUD had been placed but she was having a lot of cramping and IUD was in the lower uterine segment.  It was removed at that time.  Endometrial biopsy planned today.    Pt has incidental finding of PE 10/2022 while she was undergoing work up for umbilical bleeding and pain.  She was on anticoagulation for six months and followed by Dr. Al Pimple.  W/u was negative.  She was on combination Myfembree at that time.  She has been cleared from hematology standpoint for proceeding with surgery.  While on anti-coagulation she had a lot of issues with bleeding and anemia but these have resolved.  Hb was 14.1 on 12/16.    She does have an appointment with Dr. Dwain Sarna today as well.  Procedure discussed with patient.  Hospital stay, recovery and pain management all discussed.  Risks discussed including but not limited to bleeding, 1% risk of receiving a  transfusion, infection, 3-4% risk of bowel/bladder/ureteral/vascular injury discussed as well as possible need for additional surgery if injury does occur discussed.  DVT/PE and rare risk of death discussed.  My actual complications with prior surgeries discussed.  Vaginal cuff dehiscence discussed.  Hernia formation discussed.  Positioning and incision locations discussed.  Patient aware if pathology abnormal she may need additional treatment.  All questions answered.     Ob Hx:   Patient's last menstrual period was 07/20/2023 (approximate).          Sexually active: Yes.   Birth control: bilateral tubal  ligation Last pap: 09/02/2022 Last MMG: n/a Smoking: No   Past Surgical History:  Procedure Laterality Date   CESAREAN SECTION  11/16/2011   Procedure: CESAREAN SECTION;  Surgeon: Michael Litter, MD;  Location: [facility];  Service: Gynecology;  Laterality: N/A;  Primary cesarean section with delivery of baby boy at 70.  Apgars 6/8.   CESAREAN SECTION N/A 07/14/2018   Procedure: CESAREAN SECTION;  Surgeon: Maxie Better, MD;  Location: [facility];  Service: Obstetrics;  Laterality: N/A;   LAPAROSCOPIC TUBAL LIGATION Bilateral 12/06/2018   Procedure: LAPAROSCOPIC TUBAL LIGATION With Bipolar Cautery;  Surgeon: Maxie Better, MD;  Location: [facility] [place];  Service: Gynecology;  Laterality: Bilateral;   WISDOM TOOTH EXTRACTION      Past Medical History:  Diagnosis Date   H/O rubella    H/O varicella    History of pregnancy induced hypertension    IDA (iron deficiency anemia)    Preeclampsia    h/o severe preeclampsia with pregnancies    Allergies: Latex  Current Outpatient Medications  Medication Sig Dispense Refill   cyanocobalamin (VITAMIN B12) 1000 MCG tablet Take 1 tablet (1,000 mcg total) by mouth daily. 130 tablet 0   ferrous sulfate 325 (65 FE) MG tablet Take 1 tablet (325 mg total) by mouth 3 (three) times daily with meals. 100 tablet 0   No current facility-administered medications for this visit.    ROS: Pertinent items are noted in HPI.  Exam:   BP 111/67 (BP Location: Left Arm, Patient Position: Sitting, Cuff Size:  Large)   Pulse 71   Ht 5' 2.75" (1.594 m)   Wt 245 lb 12.8 oz (111.5 kg)   LMP 07/20/2023 (Approximate)   BMI 43.89 kg/m   General appearance: alert and cooperative Head: Normocephalic, without obvious abnormality, atraumatic Neck: no adenopathy, supple, symmetrical, trachea midline and thyroid not enlarged, symmetric, no tenderness/mass/nodules Lungs: clear to auscultation bilaterally Heart: regular rate and rhythm, S1, S2  normal, no murmur, click, rub or gallop Abdomen: soft, non-tender; bowel sounds normal; no masses,  no organomegaly Extremities: extremities normal, atraumatic, no cyanosis or edema Skin: Skin color, texture, turgor normal. No rashes or lesions Lymph nodes: Cervical, supraclavicular, and axillary nodes normal. no inguinal nodes palpated Neurologic: Grossly normal  Pelvic: External genitalia:  no lesions              Urethra: normal appearing urethra with no masses, tenderness or lesions              Bartholins and Skenes: normal                 Vagina: normal appearing vagina with normal color and discharge, no lesions              Cervix: normal appearance              Pap taken: No.        Bimanual Exam:  Uterus:  uterus is normal size, shape, consistency and nontender, enlarged to 10 week's size, mobile                                      Adnexa:    normal adnexa in size, nontender and no masses                                      Rectovaginal: Deferred                                      Anus:  no visible lesions  Endometrial biopsy recommended.  Discussed with patient.  Verbal and written consent obtained.   Procedure:  Speculum placed.  Cervix visualized and cleansed with betadine prep.  A single toothed tenaculum was applied to the anterior lip of the cervix.  Endometrial pipelle was advanced through the cervix into the endometrial cavity without difficulty.  Pipelle passed to 8cm.  Suction applied and pipelle removed with good tissue sample obtained.  Tenculum removed.  No bleeding noted.  Patient tolerated procedure well.  Chaperone, Ina Homes, present for examination.   Assessment/Plan: 1. History of menorrhagia (Primary) - TLH/bilateral salpingectomy, cystoscopy, possible oophrectomy planned.  Pre op planning, dos and don'ts discussed - post op instructions reviewed as well - endometrial biopsy obtained today  2. Intramural leiomyoma of uterus  3. PE (pulmonary  thromboembolism) (HCC) - will use perioperative and post operative lovenox.  Discussed with pt.  She did not use this with her PE so will need to do teaching in the hospital  4. Umbilical mass - will have this removed by Dr. Dwain Sarna  5. Latex allergy

## 2023-08-10 LAB — SURGICAL PATHOLOGY

## 2023-08-11 ENCOUNTER — Encounter (HOSPITAL_BASED_OUTPATIENT_CLINIC_OR_DEPARTMENT_OTHER): Payer: Self-pay | Admitting: Obstetrics & Gynecology

## 2023-08-11 DIAGNOSIS — R1909 Other intra-abdominal and pelvic swelling, mass and lump: Secondary | ICD-10-CM | POA: Diagnosis not present

## 2023-08-17 NOTE — Progress Notes (Signed)
 Surgical Instructions   Your procedure is scheduled on Tuesday, April 8, 25.. Report to [facility] Main Entrance "A" at 10:10 A.M., then check in with the Admitting office. Any questions or running late day of surgery: call [phone]  Questions prior to your surgery date: call [phone], Monday-Friday, 8am-4pm. If you experience any cold or flu symptoms such as cough, fever, chills, shortness of breath, etc. between now and your scheduled surgery, please notify us at the above number.     Remember:  Do not eat after midnight the night before your surgery   You may drink clear liquids until 9:10 the morning of your surgery.   Clear liquids allowed are: Water, Non-Citrus Juices (without pulp), Carbonated Beverages, Clear Tea (no milk, honey, etc.), Black Coffee Only (NO MILK, CREAM OR POWDERED CREAMER of any kind), and Gatorade.  Patient Instructions  The day of surgery:  Drink ONE (1) Pre-Surgery Clear Ensure by 9:10 the morning of surgery. Drink in one sitting. Do not sip.  This drink was given to you during your hospital pre-op appointment visit.  Nothing else to drink after completing the Pre-Surgery Clear Ensure.         If you have questions, please contact your surgeon's office.     Take these medicines the morning of surgery with A SIP OF WATER  ferrous sulfate    May take these medicines IF NEEDED: Acetaminophen    One week prior to surgery, STOP taking any Aspirin (unless otherwise instructed by your surgeon) Aleve, Naproxen, Motrin, Goody's, BC's, all herbal medications, fish oil, and non-prescription vitamins. This includes your Ibuprofen and Ibuprofen-Acetaminophen medication                       Do NOT Smoke (Tobacco/Vaping) for 24 hours prior to your procedure.  If you use a CPAP at night, you may bring your mask/headgear for your overnight stay.   You will be asked to remove any contacts, glasses, piercing's, hearing aid's, dentures/partials prior to  surgery. Please bring cases for these items if needed.    Patients discharged the day of surgery will not be allowed to drive home, and someone needs to stay with them for 24 hours.  SURGICAL WAITING ROOM VISITATION Patients may have no more than 2 support people in the waiting area - these visitors may rotate.   Pre-op nurse will coordinate an appropriate time for 1 ADULT support person, who may not rotate, to accompany patient in pre-op.  Children under the age of 40 must have an adult with them who is not the patient and must remain in the main waiting area with an adult.  If the patient needs to stay at the hospital during part of their recovery, the visitor guidelines for inpatient rooms apply.  Please refer to the [facility] website for the visitor guidelines for any additional information.   If you received a COVID test during your pre-op visit  it is requested that you wear a mask when out in public, stay away from anyone that may not be feeling well and notify your surgeon if you develop symptoms. If you have been in contact with anyone that has tested positive in the last 10 days please notify you surgeon.      Pre-operative CHG Bathing Instructions   You can play a key role in reducing the risk of infection after surgery. Your skin needs to be as free of germs as possible. You can reduce the  number of germs on your skin by washing with CHG (chlorhexidine gluconate) soap before surgery. CHG is an antiseptic soap that kills germs and continues to kill germs even after washing.   DO NOT use if you have an allergy to chlorhexidine/CHG or antibacterial soaps. If your skin becomes reddened or irritated, stop using the CHG and notify one of our RNs at [phone].              TAKE A SHOWER THE NIGHT BEFORE SURGERY AND THE DAY OF SURGERY    Please keep in mind the following:  DO NOT shave, including legs and underarms, 48 hours prior to surgery.   You may shave your face before/day  of surgery.  Place clean sheets on your bed the night before surgery Use a clean washcloth (not used since being washed) for each shower. DO NOT sleep with pet's night before surgery.  CHG Shower Instructions:  Wash your face and private area with normal soap. If you choose to wash your hair, wash first with your normal shampoo.  After you use shampoo/soap, rinse your hair and body thoroughly to remove shampoo/soap residue.  Turn the water OFF and apply half the bottle of CHG soap to a CLEAN washcloth.  Apply CHG soap ONLY FROM YOUR NECK DOWN TO YOUR TOES (washing for 3-5 minutes)  DO NOT use CHG soap on face, private areas, open wounds, or sores.  Pay special attention to the area where your surgery is being performed.  If you are having back surgery, having someone wash your back for you may be helpful. Wait 2 minutes after CHG soap is applied, then you may rinse off the CHG soap.  Pat dry with a clean towel  Put on clean pajamas    Additional instructions for the day of surgery: DO NOT APPLY any lotions, deodorants, or perfumes.   Do not wear jewelry or makeup Do not wear nail polish, gel polish, artificial nails, or any other type of covering on natural nails (fingers and toes) Do not bring valuables to the hospital. [facility] is not responsible for valuables/personal belongings. Put on clean/comfortable clothes.  Please brush your teeth.  Ask your nurse before applying any prescription medications to the skin.

## 2023-08-18 ENCOUNTER — Encounter (HOSPITAL_COMMUNITY): Payer: Self-pay

## 2023-08-18 ENCOUNTER — Other Ambulatory Visit: Payer: Self-pay

## 2023-08-18 ENCOUNTER — Encounter (HOSPITAL_COMMUNITY)
Admission: RE | Admit: 2023-08-18 | Discharge: 2023-08-18 | Disposition: A | Discharge status: Home / Self Care | Source: Ambulatory Visit | Attending: Obstetrics & Gynecology | Admitting: Obstetrics & Gynecology

## 2023-08-18 DIAGNOSIS — Z01812 Encounter for preprocedural laboratory examination: Secondary | ICD-10-CM | POA: Diagnosis not present

## 2023-08-18 DIAGNOSIS — Z8742 Personal history of other diseases of the female genital tract: Secondary | ICD-10-CM | POA: Insufficient documentation

## 2023-08-18 DIAGNOSIS — Z01818 Encounter for other preprocedural examination: Secondary | ICD-10-CM

## 2023-08-18 LAB — CBC
HCT: 40.6 % (ref 36.0–46.0)
Hemoglobin: 13.2 g/dL (ref 12.0–15.0)
MCH: 29.9 pg (ref 26.0–34.0)
MCHC: 32.5 g/dL (ref 30.0–36.0)
MCV: 91.9 fL (ref 80.0–100.0)
Platelets: 327 10*3/uL (ref 150–400)
RBC: 4.42 MIL/uL (ref 3.87–5.11)
RDW: 14 % (ref 11.5–15.5)
WBC: 4.5 10*3/uL (ref 4.0–10.5)
nRBC: 0 % (ref 0.0–0.2)

## 2023-08-18 LAB — TYPE AND SCREEN
ABO/RH(D): A POS
Antibody Screen: NEGATIVE

## 2023-08-18 NOTE — Progress Notes (Addendum)
 PCP -Abbe Amsterdam MD Cardiologist - denies  PPM/ICD - denies Device Orders - n/a Rep Notified - n/a  Chest x-ray - denies EKG - 12/02/2018 Stress Test - denies  ECHO - denies Cardiac Cath - denies  Sleep Study - denies CPAP - n/a  Fasting Blood Sugar - no DM Checks Blood Sugar _____ times a day  Last dose of GLP1 agonist- n/a  GLP1 instructions: n/a  Blood Thinner Instructions: n/a Aspirin Instructions: n/a  ERAS Protcol - yes, till 0910 PRE-SURGERY Ensure or G2- Pre-Ensure  COVID TEST- n/a   Anesthesia review: yes, pt had PE in June of 2024. Per pt she was told to stop Eliquis on 04/2023. Pt had hematologist consult for IDA last year.    Patient denies shortness of breath, fever, cough and chest pain at PAT appointment   All instructions explained to the patient, with a verbal understanding of the material. Patient agrees to go over the instructions while at home for a better understanding. Patient also instructed to self quarantine after being tested for COVID-19. The opportunity to ask questions was provided.

## 2023-08-22 ENCOUNTER — Other Ambulatory Visit (HOSPITAL_BASED_OUTPATIENT_CLINIC_OR_DEPARTMENT_OTHER): Payer: Self-pay | Admitting: Obstetrics & Gynecology

## 2023-08-25 ENCOUNTER — Ambulatory Visit (HOSPITAL_COMMUNITY): Payer: Self-pay

## 2023-08-25 ENCOUNTER — Other Ambulatory Visit: Payer: Self-pay

## 2023-08-25 ENCOUNTER — Encounter (HOSPITAL_COMMUNITY)
Admission: RE | Disposition: A | Discharge status: Home / Self Care | Payer: Self-pay | Source: Ambulatory Visit | Attending: Obstetrics & Gynecology

## 2023-08-25 ENCOUNTER — Ambulatory Visit (HOSPITAL_COMMUNITY): Payer: Self-pay | Admitting: Physician Assistant

## 2023-08-25 ENCOUNTER — Ambulatory Visit (HOSPITAL_COMMUNITY)
Admission: RE | Admit: 2023-08-25 | Discharge: 2023-08-26 | Disposition: A | Discharge status: Home / Self Care | Payer: BC Managed Care – PPO | Source: Ambulatory Visit | Attending: Obstetrics & Gynecology | Admitting: Obstetrics & Gynecology

## 2023-08-25 ENCOUNTER — Encounter (HOSPITAL_COMMUNITY): Payer: Self-pay | Admitting: Obstetrics & Gynecology

## 2023-08-25 DIAGNOSIS — N888 Other specified noninflammatory disorders of cervix uteri: Secondary | ICD-10-CM | POA: Diagnosis not present

## 2023-08-25 DIAGNOSIS — N80C11 Endometriosis of the anterior abdominal wall, fascia and muscular layers: Secondary | ICD-10-CM | POA: Diagnosis not present

## 2023-08-25 DIAGNOSIS — N8003 Adenomyosis of the uterus: Secondary | ICD-10-CM

## 2023-08-25 DIAGNOSIS — D250 Submucous leiomyoma of uterus: Secondary | ICD-10-CM | POA: Diagnosis not present

## 2023-08-25 DIAGNOSIS — N920 Excessive and frequent menstruation with regular cycle: Secondary | ICD-10-CM

## 2023-08-25 DIAGNOSIS — D259 Leiomyoma of uterus, unspecified: Secondary | ICD-10-CM

## 2023-08-25 DIAGNOSIS — Z90710 Acquired absence of both cervix and uterus: Secondary | ICD-10-CM

## 2023-08-25 DIAGNOSIS — Z7901 Long term (current) use of anticoagulants: Secondary | ICD-10-CM | POA: Diagnosis not present

## 2023-08-25 DIAGNOSIS — Z862 Personal history of diseases of the blood and blood-forming organs and certain disorders involving the immune mechanism: Secondary | ICD-10-CM | POA: Diagnosis not present

## 2023-08-25 DIAGNOSIS — Z86718 Personal history of other venous thrombosis and embolism: Secondary | ICD-10-CM | POA: Diagnosis not present

## 2023-08-25 DIAGNOSIS — G8918 Other acute postprocedural pain: Secondary | ICD-10-CM

## 2023-08-25 DIAGNOSIS — I272 Pulmonary hypertension, unspecified: Secondary | ICD-10-CM | POA: Insufficient documentation

## 2023-08-25 DIAGNOSIS — N80129 Deep endometriosis of ovary, unspecified ovary: Secondary | ICD-10-CM | POA: Insufficient documentation

## 2023-08-25 DIAGNOSIS — I10 Essential (primary) hypertension: Secondary | ICD-10-CM | POA: Diagnosis not present

## 2023-08-25 DIAGNOSIS — Z9851 Tubal ligation status: Secondary | ICD-10-CM | POA: Diagnosis not present

## 2023-08-25 DIAGNOSIS — I2699 Other pulmonary embolism without acute cor pulmonale: Secondary | ICD-10-CM | POA: Diagnosis not present

## 2023-08-25 DIAGNOSIS — N80C2 Endometriosis of the umbilicus: Secondary | ICD-10-CM | POA: Diagnosis not present

## 2023-08-25 DIAGNOSIS — Z87891 Personal history of nicotine dependence: Secondary | ICD-10-CM | POA: Diagnosis not present

## 2023-08-25 DIAGNOSIS — Z8742 Personal history of other diseases of the female genital tract: Secondary | ICD-10-CM

## 2023-08-25 DIAGNOSIS — Z01818 Encounter for other preprocedural examination: Secondary | ICD-10-CM

## 2023-08-25 HISTORY — PX: MASS EXCISION: SHX2000

## 2023-08-25 HISTORY — PX: CYSTOSCOPY: SHX5120

## 2023-08-25 HISTORY — PX: TOTAL LAPAROSCOPIC HYSTERECTOMY WITH BILATERAL SALPINGO OOPHORECTOMY: SHX6845

## 2023-08-25 LAB — POCT PREGNANCY, URINE: Preg Test, Ur: NEGATIVE

## 2023-08-25 SURGERY — HYSTERECTOMY, TOTAL, LAPAROSCOPIC, WITH BILATERAL SALPINGO-OOPHORECTOMY
Anesthesia: General | Site: Bladder

## 2023-08-25 MED ORDER — ALUM & MAG HYDROXIDE-SIMETH 200-200-20 MG/5ML PO SUSP: 30.0000 mL | ORAL | Status: DC | PRN

## 2023-08-25 MED ORDER — KETOROLAC TROMETHAMINE 30 MG/ML IJ SOLN
INTRAMUSCULAR | Status: DC | PRN
  Administered 2023-08-25: 30 mg via INTRAVENOUS

## 2023-08-25 MED ORDER — PROPOFOL 10 MG/ML IV BOLUS
INTRAVENOUS | Status: AC
  Filled 2023-08-25: qty 20

## 2023-08-25 MED ORDER — PANTOPRAZOLE SODIUM 40 MG IV SOLR
40.0000 mg | Freq: Every day | INTRAVENOUS | Status: DC
  Administered 2023-08-25: 40 mg via INTRAVENOUS
  Filled 2023-08-25: qty 10

## 2023-08-25 MED ORDER — PHENYLEPHRINE 80 MCG/ML (10ML) SYRINGE FOR IV PUSH (FOR BLOOD PRESSURE SUPPORT)
PREFILLED_SYRINGE | INTRAVENOUS | Status: DC | PRN
  Administered 2023-08-25: 80 ug via INTRAVENOUS

## 2023-08-25 MED ORDER — HYDROMORPHONE HCL 1 MG/ML IJ SOLN
INTRAMUSCULAR | Status: AC
Start: 2023-08-25 — End: ?
  Filled 2023-08-25: qty 0.5

## 2023-08-25 MED ORDER — OXYCODONE-ACETAMINOPHEN 5-325 MG PO TABS: 1.0000 | ORAL_TABLET | ORAL | Status: DC | PRN

## 2023-08-25 MED ORDER — ACETAMINOPHEN 500 MG PO TABS
ORAL_TABLET | ORAL | Status: AC
  Administered 2023-08-25: 1000 mg via ORAL
  Filled 2023-08-25: qty 2

## 2023-08-25 MED ORDER — ORAL CARE MOUTH RINSE: 15.0000 mL | Freq: Once | OROMUCOSAL | Status: AC

## 2023-08-25 MED ORDER — LACTATED RINGERS IV SOLN: INTRAVENOUS | Status: DC

## 2023-08-25 MED ORDER — FENTANYL CITRATE (PF) 250 MCG/5ML IJ SOLN
INTRAMUSCULAR | Status: DC | PRN
  Administered 2023-08-25 (×6): 50 ug via INTRAVENOUS
  Administered 2023-08-25: 150 ug via INTRAVENOUS
  Administered 2023-08-25: 50 ug via INTRAVENOUS

## 2023-08-25 MED ORDER — DEXAMETHASONE SODIUM PHOSPHATE 10 MG/ML IJ SOLN
INTRAMUSCULAR | Status: AC
  Filled 2023-08-25: qty 1

## 2023-08-25 MED ORDER — MIDAZOLAM HCL 2 MG/2ML IJ SOLN
INTRAMUSCULAR | Status: DC | PRN
  Administered 2023-08-25: 2 mg via INTRAVENOUS

## 2023-08-25 MED ORDER — MIDAZOLAM HCL 2 MG/2ML IJ SOLN: 0.5000 mg | Freq: Once | INTRAMUSCULAR | Status: DC | PRN

## 2023-08-25 MED ORDER — ENOXAPARIN SODIUM 60 MG/0.6ML IJ SOSY: 50.0000 mg | PREFILLED_SYRINGE | INTRAMUSCULAR | Status: DC

## 2023-08-25 MED ORDER — DEXMEDETOMIDINE HCL IN NACL 80 MCG/20ML IV SOLN
INTRAVENOUS | Status: DC | PRN
Start: 2023-08-25 — End: 2023-08-25
  Administered 2023-08-25 (×3): 4 ug via INTRAVENOUS
  Administered 2023-08-25 (×2): 8 ug via INTRAVENOUS

## 2023-08-25 MED ORDER — OXYCODONE HCL 5 MG PO TABS: 5.0000 mg | ORAL_TABLET | Freq: Once | ORAL | Status: DC | PRN

## 2023-08-25 MED ORDER — INFLUENZA VIRUS VACC SPLIT PF (FLUZONE) 0.5 ML IM SUSY
0.5000 mL | PREFILLED_SYRINGE | INTRAMUSCULAR | Status: DC
  Filled 2023-08-25: qty 0.5

## 2023-08-25 MED ORDER — HYDROMORPHONE HCL 1 MG/ML IJ SOLN: 0.2500 mg | INTRAMUSCULAR | Status: DC | PRN

## 2023-08-25 MED ORDER — LIDOCAINE 2% (20 MG/ML) 5 ML SYRINGE
INTRAMUSCULAR | Status: AC
  Filled 2023-08-25: qty 5

## 2023-08-25 MED ORDER — KETOROLAC TROMETHAMINE 30 MG/ML IJ SOLN
30.0000 mg | Freq: Four times a day (QID) | INTRAMUSCULAR | Status: AC
  Administered 2023-08-25 – 2023-08-26 (×3): 30 mg via INTRAVENOUS
  Filled 2023-08-25 (×3): qty 1

## 2023-08-25 MED ORDER — SIMETHICONE 80 MG PO CHEW: 80.0000 mg | CHEWABLE_TABLET | Freq: Four times a day (QID) | ORAL | Status: DC | PRN

## 2023-08-25 MED ORDER — ENOXAPARIN SODIUM 40 MG/0.4ML IJ SOSY: 40.0000 mg | PREFILLED_SYRINGE | INTRAMUSCULAR | Status: AC

## 2023-08-25 MED ORDER — ROCURONIUM BROMIDE 10 MG/ML (PF) SYRINGE
PREFILLED_SYRINGE | INTRAVENOUS | Status: DC | PRN
  Administered 2023-08-25: 20 mg via INTRAVENOUS
  Administered 2023-08-25: 10 mg via INTRAVENOUS
  Administered 2023-08-25: 60 mg via INTRAVENOUS
  Administered 2023-08-25 (×2): 10 mg via INTRAVENOUS

## 2023-08-25 MED ORDER — CHLORHEXIDINE GLUCONATE 0.12 % MT SOLN: 15.0000 mL | Freq: Once | OROMUCOSAL | Status: AC

## 2023-08-25 MED ORDER — ACETAMINOPHEN 500 MG PO TABS
1000.0000 mg | ORAL_TABLET | Freq: Once | ORAL | Status: DC
Start: 2023-08-25 — End: 2023-08-25

## 2023-08-25 MED ORDER — ENOXAPARIN (LOVENOX) PATIENT EDUCATION KIT
PACK | Freq: Once | Status: DC
  Filled 2023-08-25: qty 1

## 2023-08-25 MED ORDER — FENTANYL CITRATE (PF) 250 MCG/5ML IJ SOLN
INTRAMUSCULAR | Status: AC
  Filled 2023-08-25: qty 5

## 2023-08-25 MED ORDER — ALBUMIN HUMAN 5 % IV SOLN: INTRAVENOUS | Status: DC | PRN

## 2023-08-25 MED ORDER — HYDROMORPHONE HCL 1 MG/ML IJ SOLN
INTRAMUSCULAR | Status: AC
  Filled 2023-08-25: qty 0.5

## 2023-08-25 MED ORDER — BUPIVACAINE HCL (PF) 0.25 % IJ SOLN
INTRAMUSCULAR | Status: DC | PRN
  Administered 2023-08-25: 40 mL

## 2023-08-25 MED ORDER — ONDANSETRON HCL 4 MG/2ML IJ SOLN
INTRAMUSCULAR | Status: DC | PRN
  Administered 2023-08-25: 4 mg via INTRAVENOUS

## 2023-08-25 MED ORDER — ONDANSETRON HCL 4 MG/2ML IJ SOLN
INTRAMUSCULAR | Status: AC
  Filled 2023-08-25: qty 2

## 2023-08-25 MED ORDER — MENTHOL 3 MG MT LOZG: 1.0000 | LOZENGE | OROMUCOSAL | Status: DC | PRN

## 2023-08-25 MED ORDER — KETOROLAC TROMETHAMINE 30 MG/ML IJ SOLN
INTRAMUSCULAR | Status: AC
Start: 2023-08-25 — End: ?
  Filled 2023-08-25: qty 1

## 2023-08-25 MED ORDER — DEXTROSE-SODIUM CHLORIDE 5-0.45 % IV SOLN: INTRAVENOUS | Status: DC

## 2023-08-25 MED ORDER — SCOPOLAMINE 1 MG/3DAYS TD PT72
1.0000 | MEDICATED_PATCH | TRANSDERMAL | Status: DC
  Administered 2023-08-25: 1.5 mg via TRANSDERMAL
  Filled 2023-08-25: qty 1

## 2023-08-25 MED ORDER — SODIUM CHLORIDE 0.9 % IV SOLN
2.0000 g | INTRAVENOUS | Status: AC
  Administered 2023-08-25: 2 g via INTRAVENOUS

## 2023-08-25 MED ORDER — BACITRACIN ZINC 500 UNIT/GM EX OINT
TOPICAL_OINTMENT | CUTANEOUS | Status: AC
  Filled 2023-08-25: qty 28.35

## 2023-08-25 MED ORDER — IBUPROFEN 600 MG PO TABS: 600.0000 mg | ORAL_TABLET | Freq: Four times a day (QID) | ORAL | Status: DC

## 2023-08-25 MED ORDER — SODIUM CHLORIDE 0.9 % IV SOLN
INTRAVENOUS | Status: AC
  Filled 2023-08-25: qty 2

## 2023-08-25 MED ORDER — MORPHINE SULFATE (PF) 2 MG/ML IV SOLN
2.0000 mg | INTRAVENOUS | Status: DC | PRN
  Administered 2023-08-25: 2 mg via INTRAVENOUS
  Filled 2023-08-25: qty 1

## 2023-08-25 MED ORDER — MIDAZOLAM HCL 2 MG/2ML IJ SOLN
INTRAMUSCULAR | Status: AC
  Filled 2023-08-25: qty 2

## 2023-08-25 MED ORDER — ROCURONIUM BROMIDE 10 MG/ML (PF) SYRINGE
PREFILLED_SYRINGE | INTRAVENOUS | Status: AC
  Filled 2023-08-25: qty 10

## 2023-08-25 MED ORDER — LIDOCAINE 2% (20 MG/ML) 5 ML SYRINGE
INTRAMUSCULAR | Status: DC | PRN
  Administered 2023-08-25: 30 mg via INTRAVENOUS

## 2023-08-25 MED ORDER — ONDANSETRON HCL 4 MG PO TABS: 4.0000 mg | ORAL_TABLET | Freq: Four times a day (QID) | ORAL | Status: DC | PRN

## 2023-08-25 MED ORDER — SUGAMMADEX SODIUM 200 MG/2ML IV SOLN
INTRAVENOUS | Status: DC | PRN
Start: 2023-08-25 — End: 2023-08-25
  Administered 2023-08-25: 50 mg via INTRAVENOUS
  Administered 2023-08-25: 100 mg via INTRAVENOUS
  Administered 2023-08-25: 50 mg via INTRAVENOUS

## 2023-08-25 MED ORDER — ENOXAPARIN SODIUM 60 MG/0.6ML IJ SOSY
60.0000 mg | PREFILLED_SYRINGE | INTRAMUSCULAR | Status: DC
  Administered 2023-08-26: 60 mg via SUBCUTANEOUS
  Filled 2023-08-25: qty 0.6

## 2023-08-25 MED ORDER — ONDANSETRON HCL 4 MG/2ML IJ SOLN: 4.0000 mg | Freq: Four times a day (QID) | INTRAMUSCULAR | Status: DC | PRN

## 2023-08-25 MED ORDER — MEPERIDINE HCL 25 MG/ML IJ SOLN: 6.2500 mg | INTRAMUSCULAR | Status: DC | PRN

## 2023-08-25 MED ORDER — OXYCODONE HCL 5 MG/5ML PO SOLN: 5.0000 mg | Freq: Once | ORAL | Status: DC | PRN

## 2023-08-25 MED ORDER — HYDROMORPHONE HCL 1 MG/ML IJ SOLN
INTRAMUSCULAR | Status: DC | PRN
  Administered 2023-08-25 (×2): .5 mg via INTRAVENOUS

## 2023-08-25 MED ORDER — POVIDONE-IODINE 10 % EX SWAB
2.0000 | Freq: Once | CUTANEOUS | Status: AC
  Administered 2023-08-25: 2 via TOPICAL

## 2023-08-25 MED ORDER — GABAPENTIN 100 MG PO CAPS
ORAL_CAPSULE | ORAL | Status: AC
  Administered 2023-08-25: 100 mg via ORAL
  Filled 2023-08-25: qty 1

## 2023-08-25 MED ORDER — PROPOFOL 10 MG/ML IV BOLUS
INTRAVENOUS | Status: DC | PRN
  Administered 2023-08-25: 10 mg via INTRAVENOUS
  Administered 2023-08-25: 190 mg via INTRAVENOUS

## 2023-08-25 MED ORDER — 0.9 % SODIUM CHLORIDE (POUR BTL) OPTIME
TOPICAL | Status: DC | PRN
  Administered 2023-08-25: 1000 mL

## 2023-08-25 MED ORDER — SODIUM CHLORIDE 0.9 % IR SOLN
Status: DC | PRN
  Administered 2023-08-25: 1000 mL

## 2023-08-25 MED ORDER — CHLORHEXIDINE GLUCONATE 0.12 % MT SOLN
OROMUCOSAL | Status: AC
  Administered 2023-08-25: 15 mL via OROMUCOSAL
  Filled 2023-08-25: qty 15

## 2023-08-25 MED ORDER — DEXAMETHASONE SODIUM PHOSPHATE 10 MG/ML IJ SOLN
INTRAMUSCULAR | Status: DC | PRN
  Administered 2023-08-25: 10 mg via INTRAVENOUS

## 2023-08-25 MED ORDER — GABAPENTIN 100 MG PO CAPS
100.0000 mg | ORAL_CAPSULE | Freq: Three times a day (TID) | ORAL | Status: DC
  Administered 2023-08-25 – 2023-08-26 (×2): 100 mg via ORAL
  Filled 2023-08-25 (×2): qty 1

## 2023-08-25 MED ORDER — ACETAMINOPHEN 500 MG PO TABS: 1000.0000 mg | ORAL_TABLET | ORAL | Status: AC

## 2023-08-25 MED ORDER — GABAPENTIN 100 MG PO CAPS: 100.0000 mg | ORAL_CAPSULE | ORAL | Status: AC

## 2023-08-25 MED ORDER — ENOXAPARIN SODIUM 40 MG/0.4ML IJ SOSY
PREFILLED_SYRINGE | INTRAMUSCULAR | Status: AC
  Administered 2023-08-25: 40 mg via SUBCUTANEOUS
  Filled 2023-08-25: qty 0.4

## 2023-08-25 SURGICAL SUPPLY — 58 items
APPLICATOR ARISTA FLEXITIP XL (MISCELLANEOUS) IMPLANT
BLADE SURG 15 STRL SS SAFETY (BLADE) IMPLANT
CABLE HIGH FREQUENCY MONO STRZ (ELECTRODE) ×3 IMPLANT
CHLORAPREP W/TINT 26 (MISCELLANEOUS) ×3 IMPLANT
COVER MAYO STAND STRL (DRAPES) ×3 IMPLANT
COVER SURGICAL LIGHT HANDLE (MISCELLANEOUS) ×3 IMPLANT
DEFOGGER SCOPE WARMER CLEARIFY (MISCELLANEOUS) IMPLANT
DERMABOND ADVANCED .7 DNX12 (GAUZE/BANDAGES/DRESSINGS) ×3 IMPLANT
DRAPE SURG IRRIG POUCH 19X23 (DRAPES) ×3 IMPLANT
DURAPREP 26ML APPLICATOR (WOUND CARE) ×3 IMPLANT
ELECT REM PT RETURN 15FT ADLT (MISCELLANEOUS) IMPLANT
ELECT REM PT RETURN 9FT ADLT (ELECTROSURGICAL) ×3 IMPLANT
ELECTRODE REM PT RTRN 9FT ADLT (ELECTROSURGICAL) ×3 IMPLANT
GOWN STRL REUS W/ TWL LRG LVL3 (GOWN DISPOSABLE) ×18 IMPLANT
GOWN STRL REUS W/ TWL XL LVL3 (GOWN DISPOSABLE) ×3 IMPLANT
HEMOSTAT ARISTA ABSORB 3G PWDR (HEMOSTASIS) IMPLANT
HIBICLENS CHG 4% 4OZ BTL (MISCELLANEOUS) ×3 IMPLANT
IRRIG SUCT STRYKERFLOW 2 WTIP (MISCELLANEOUS) ×3 IMPLANT
IRRIGATION SUCT STRKRFLW 2 WTP (MISCELLANEOUS) ×3 IMPLANT
KIT BASIN OR (CUSTOM PROCEDURE TRAY) ×3 IMPLANT
KIT PINK PAD W/HEAD ARE REST (MISCELLANEOUS) ×3 IMPLANT
KIT PINK PAD W/HEAD ARM REST (MISCELLANEOUS) ×3 IMPLANT
KIT TURNOVER KIT B (KITS) ×3 IMPLANT
LIGASURE LAP MARYLAND 5MM 37CM (ELECTROSURGICAL) IMPLANT
LIGASURE VESSEL 5MM BLUNT TIP (ELECTROSURGICAL) ×3 IMPLANT
NDL INSUFFLATION 14GA 120MM (NEEDLE) ×3 IMPLANT
NEEDLE INSUFFLATION 14GA 120MM (NEEDLE) ×3 IMPLANT
NS IRRIG 1000ML POUR BTL (IV SOLUTION) ×3 IMPLANT
OCCLUDER COLPOPNEUMO (BALLOONS) ×3 IMPLANT
PACK LAPAROSCOPY BASIN (CUSTOM PROCEDURE TRAY) ×3 IMPLANT
POUCH LAPAROSCOPIC INSTRUMENT (MISCELLANEOUS) ×3 IMPLANT
SCISSORS LAP 5X35 DISP (ENDOMECHANICALS) ×3 IMPLANT
SET CYSTO W/LG BORE CLAMP LF (SET/KITS/TRAYS/PACK) ×3 IMPLANT
SET TRI-LUMEN FLTR TB AIRSEAL (TUBING) ×3 IMPLANT
SET TUBE SMOKE EVAC HIGH FLOW (TUBING) ×3 IMPLANT
SHEARS 1100 HARMONIC 36 (ELECTROSURGICAL) IMPLANT
SHEARS HARMONIC ACE PLUS 36CM (ENDOMECHANICALS) ×3 IMPLANT
SLEEVE SCD COMPRESS KNEE MED (STOCKING) ×3 IMPLANT
SPECIMEN JAR SMALL (MISCELLANEOUS) IMPLANT
SUT VIC AB 0 CT1 27XBRD ANBCTR (SUTURE) ×6 IMPLANT
SUT VIC AB 4-0 PS2 18 (SUTURE) ×6 IMPLANT
SUT VIC AB 4-0 SH 27XBRD (SUTURE) IMPLANT
SUT VICRYL 0 UR6 27IN ABS (SUTURE) IMPLANT
SUT VLOC 180 0 9IN GS21 (SUTURE) ×3 IMPLANT
SYR 10ML LL (SYRINGE) ×3 IMPLANT
SYR 50ML LL SCALE MARK (SYRINGE) ×6 IMPLANT
SYR CONTROL 10ML LL (SYRINGE) ×3 IMPLANT
TIP UTERINE 5.1X6CM LAV DISP (MISCELLANEOUS) IMPLANT
TIP UTERINE 6.7X10CM GRN DISP (MISCELLANEOUS) IMPLANT
TIP UTERINE 6.7X6CM WHT DISP (MISCELLANEOUS) IMPLANT
TIP UTERINE 6.7X8CM BLUE DISP (MISCELLANEOUS) IMPLANT
TOWEL GREEN STERILE FF (TOWEL DISPOSABLE) ×6 IMPLANT
TRAY FOLEY W/BAG SLVR 14FR (SET/KITS/TRAYS/PACK) ×3 IMPLANT
TROCAR ADV FIXATION 5X100MM (TROCAR) IMPLANT
TROCAR KII 8X100ML NONTHREADED (TROCAR) IMPLANT
TROCAR XCEL NON-BLD 5MMX100MML (ENDOMECHANICALS) IMPLANT
UNDERPAD 30X36 HEAVY ABSORB (UNDERPADS AND DIAPERS) ×3 IMPLANT
WARMER LAPAROSCOPE (MISCELLANEOUS) ×3 IMPLANT

## 2023-08-25 NOTE — Discharge Instructions (Signed)
 Please remove dressing at umbilicus in 48 hours then may get this wet.

## 2023-08-25 NOTE — Transfer of Care (Signed)
 Immediate Anesthesia Transfer of Care Note  Patient: Chloe Graves  Procedure(s) Performed: TOTAL LAPAROSCOPIC HYSTERECTOMY, BILATERAL SALPINGECTOMY (Bilateral: Abdomen) CYSTOSCOPY (Bladder) EXCISION OF UMBILICAL MASS (Abdomen)  Patient Location: PACU  Anesthesia Type:General  Level of Consciousness: awake, alert , and oriented  Airway & Oxygen Therapy: Patient Spontanous Breathing and Patient connected to face mask oxygen  Post-op Assessment: Report given to RN and Post -op Vital signs reviewed and stable  Post vital signs: Reviewed and stable  Last Vitals:  Vitals Value Taken Time  BP 100/62 08/25/23 1645  Temp 36.2 C 08/25/23 1641  Pulse 76 08/25/23 1646  Resp 17 08/25/23 1646  SpO2 100 % 08/25/23 1646  Vitals shown include unfiled device data.  Last Pain:  Vitals:   08/25/23 1641  TempSrc:   PainSc: Asleep         Complications: No notable events documented.

## 2023-08-25 NOTE — Anesthesia Postprocedure Evaluation (Signed)
 Anesthesia Post Note  Patient: Chloe Graves  Procedure(s) Performed: TOTAL LAPAROSCOPIC HYSTERECTOMY, BILATERAL SALPINGECTOMY (Bilateral: Abdomen) CYSTOSCOPY (Bladder) EXCISION OF UMBILICAL MASS (Abdomen)     Patient location during evaluation: PACU Anesthesia Type: General Level of consciousness: awake and alert Pain management: pain level controlled Vital Signs Assessment: post-procedure vital signs reviewed and stable Respiratory status: spontaneous breathing, nonlabored ventilation and respiratory function stable Cardiovascular status: blood pressure returned to baseline and stable Postop Assessment: no apparent nausea or vomiting Anesthetic complications: no   No notable events documented.  Last Vitals:  Vitals:   08/25/23 1715 08/25/23 1730  BP: 104/71 111/62  Pulse: 64 66  Resp: 16 16  Temp: (!) 36.4 C (!) 36.4 C  SpO2: 93% 93%    Last Pain:  Vitals:   08/25/23 1730  TempSrc:   PainSc: 0-No pain                 Tawna Alwin,E. Hinley Brimage

## 2023-08-25 NOTE — H&P (Signed)
  33 year old female for at least over a year has had a mass at her umbilicus that bleeds during menses. This area does cause her some discomfort. She underwent a CT scan on June 2 that shows her to have a 2.7 x 2.0 x 2.4 cm mass enhancing in the umbilicus. She also at that time underwent a CT scan due to an abnormality seen in her right lower lobe that confirmed an acute pulmonary embolus in the right middle lobe and right lower lobe. She has been treated for PE and no longer on doac. She is doing well. Mass not really changed much but still hurts. Due for hysterectomy soon combined with mass excision   Review of Systems: A complete review of systems was obtained from the patient. I have reviewed this information and discussed as appropriate with the patient. See HPI as well for other ROS.  Review of Systems  All other systems reviewed and are negative.   Medical History: Past Medical History:  Diagnosis Date  Anemia  DVT (deep venous thrombosis) (CMS/HHS-HCC)  Hypertension  Pulmonary hypertension (CMS/HHS-HCC)   Patient Active Problem List  Diagnosis  Umbilical mass   Past Surgical History:  Procedure Laterality Date  CESAREAN SECTION  ESSURE TUBAL LIGATION    No Known Allergies  Current Outpatient Medications on File Prior to Visit  Medication Sig Dispense Refill  cyanocobalamin (VITAMIN B12) 1000 MCG tablet Take 1,000 mcg by mouth once daily  megestroL (MEGACE) 20 MG tablet Take 20 mg by mouth 2 (two) times daily  apixaban (ELIQUIS) 5 mg tablet Take 5 mg by mouth 2 (two) times daily (Patient not taking: Reported on 08/11/2023)   History reviewed. No pertinent family history.   Social History   Tobacco Use  Smoking Status Former  Types: Cigarettes  Start date: 2018  Smokeless Tobacco Never    Social History   Socioeconomic History  Marital status: Married  Tobacco Use  Smoking status: Former  Types: Cigarettes  Start date: 2018  Smokeless tobacco: Never   Substance and Sexual Activity  Alcohol use: Yes  Comment: occasionally  Drug use: Never     Objective:   Vitals:  08/11/23 1549  PainSc: 3  PainLoc: Umbilicus    Physical Exam Vitals reviewed.  Constitutional:  Appearance: Normal appearance.  Neurological:  Mental Status: She is alert.  Cv regular Pulm effort normal Ab 2.5 cm mass just below umbilicus  Assessment and Plan:   Umbilical mass  Excision of umbilical mass likely endometrioma. She is now off her anticoagulation and ready to proceed with surgery. I am going to excise this in conjunction with her hysterectomy with Dr. Hyacinth Meeker. We discussed the risks and recovery associated with my portion of the surgery.

## 2023-08-25 NOTE — Progress Notes (Signed)
*   Day of Surgery * Procedure(s) (LRB): TOTAL LAPAROSCOPIC HYSTERECTOMY, BILATERAL SALPINGECTOMY (Bilateral) CYSTOSCOPY (N/A) EXCISION OF UMBILICAL MASS (N/A)  Subjective: Patient reports some nausea earlier but that is better now.  Having pain at the umbilicus.  Surgery discussed.  Drinking liquids find. Hasn't voided yet.     Objective: I have reviewed patient's vital signs, intake and output, medications, and labs. Vitals:   08/25/23 1755 08/25/23 1825  BP: 120/68 122/75  Pulse: 66 70  Resp: 16 18  Temp:  98.3 F (36.8 C)  SpO2: 98% 98%    General: alert and no distress Resp: clear to auscultation bilaterally Cardio: regular rate and rhythm, S1, S2 normal, no murmur, click, rub or gallop GI: soft, non distende Extremities: extremities normal, atraumatic, no cyanosis or edema Vaginal Bleeding: none  Assessment: s/p Procedure(s): TOTAL LAPAROSCOPIC HYSTERECTOMY, BILATERAL SALPINGECTOMY (Bilateral) CYSTOSCOPY (N/A) EXCISION OF UMBILICAL MASS (N/A): stable  Plan: Advance diet Encourage ambulation Advance to PO medication Lovenox teaching ordered for tomorrow CBC and BMP in am  LOS: 0 days    Jerene Bears, MD 08/25/2023, 7:10 PM

## 2023-08-25 NOTE — Anesthesia Preprocedure Evaluation (Addendum)
 Anesthesia Evaluation  Patient identified by MRN, date of birth, ID band Patient awake    Reviewed: Allergy & Precautions, NPO status , Patient's Chart, lab work & pertinent test results  History of Anesthesia Complications Negative for: history of anesthetic complications  Airway Mallampati: I  TM Distance: >3 FB Neck ROM: Full    Dental  (+) Dental Advisory Given   Pulmonary former smoker, PE (no longer on Eliquis)   breath sounds clear to auscultation       Cardiovascular hypertension, (-) angina  Rhythm:Regular Rate:Normal     Neuro/Psych negative neurological ROS     GI/Hepatic negative GI ROS, Neg liver ROS,,,  Endo/Other    Class 4 obesityBMI 45  Renal/GU negative Renal ROS     Musculoskeletal   Abdominal   Peds  Hematology Hb 13.2, plt 327k   Anesthesia Other Findings   Reproductive/Obstetrics                             Anesthesia Physical Anesthesia Plan  ASA: 3  Anesthesia Plan: General   Post-op Pain Management: Tylenol PO (pre-op)*   Induction: Intravenous  PONV Risk Score and Plan: 3 and Ondansetron, Dexamethasone and Scopolamine patch - Pre-op  Airway Management Planned: Oral ETT  Additional Equipment: None  Intra-op Plan:   Post-operative Plan: Extubation in OR  Informed Consent: I have reviewed the patients History and Physical, chart, labs and discussed the procedure including the risks, benefits and alternatives for the proposed anesthesia with the patient or authorized representative who has indicated his/her understanding and acceptance.     Dental advisory given  Plan Discussed with: CRNA and Surgeon  Anesthesia Plan Comments:        Anesthesia Quick Evaluation

## 2023-08-25 NOTE — H&P (Signed)
 Chloe Graves is an 32 y.o. female G3P2 MAAF with hx of menorrhagia, iron deficiency anemia and fibroid uterus here for definitive surgical treatment with TLH, bilateral salpingectomy and cystoscopy.  Combined surgery with Dr. Dwain Sarna general surgery is planned due to umbilical mass that was noted on CT as well.  Pt has hx of cyclical bleeding at her umbilicus monthly so the umbilical mass is likely endometriosis.  Pt had an incidental PE noted on CT that was done for evaluation of the umbilical bleeding.  She was evaluated by hematology and was treated with anticoaulation for 6 months.  She has stopped this and has been cleared for surgery by Dr. Al Pimple. She will be treated with perioperative lovenox and post op lovenox as well.    Pre op endometrial biopsy showed normal pathology  Pertinent Gynecological History: Menses:  menorrhagia Contraception: tubal ligation DES exposure: denies Blood transfusions:  yes with menorrhagia when on anticoagulation Sexually transmitted diseases: no past history Previous GYN Procedures:  c section x 2   Last mammogram:  n/a Last pap: normal Date: 4/16/202 OB History: G3, P2   Menstrual History: Patient's last menstrual period was 08/08/2023 (approximate).    Past Medical History:  Diagnosis Date   H/O rubella    H/O varicella    History of pregnancy induced hypertension    IDA (iron deficiency anemia)    Preeclampsia    h/o severe preeclampsia with pregnancies   Pulmonary embolism (HCC) 2024   per pt she completed Eliquis last december    Past Surgical History:  Procedure Laterality Date   CESAREAN SECTION  11/16/2011   Procedure: CESAREAN SECTION;  Surgeon: Michael Litter, MD;  Location: [facility];  Service: Gynecology;  Laterality: N/A;  Primary cesarean section with delivery of baby boy at 71.  Apgars 6/8.   CESAREAN SECTION N/A 07/14/2018   Procedure: CESAREAN SECTION;  Surgeon: Maxie Better, MD;  Location: [facility];  Service:  Obstetrics;  Laterality: N/A;   LAPAROSCOPIC TUBAL LIGATION Bilateral 12/06/2018   Procedure: LAPAROSCOPIC TUBAL LIGATION With Bipolar Cautery;  Surgeon: Maxie Better, MD;  Location: [facility];  Service: Gynecology;  Laterality: Bilateral;   WISDOM TOOTH EXTRACTION      Family History  Problem Relation Age of Onset   Cancer Maternal Aunt    Cancer Maternal Grandmother     Social History:  reports that she quit smoking about 6 years ago. Her smoking use included cigarettes. She started smoking about 7 years ago. She has never used smokeless tobacco. She reports current alcohol use of about 1.0 standard drink of alcohol per week. She reports that she does not use drugs.  Allergies:  Allergies  Allergen Reactions   Latex Itching    Medications Prior to Admission  Medication Sig Dispense Refill Last Dose/Taking   Acetaminophen (MIDOL PO) Take 2 tablets by mouth every 6 (six) hours as needed (pain).   Taking As Needed   cyanocobalamin (VITAMIN B12) 1000 MCG tablet Take 1 tablet (1,000 mcg total) by mouth daily. 130 tablet 0 Taking   ferrous sulfate 325 (65 FE) MG tablet Take 1 tablet (325 mg total) by mouth 3 (three) times daily with meals. 100 tablet 0 Taking   Ibuprofen 200 MG CAPS Take 200 mg by mouth every 6 (six) hours as needed (pain).   Taking As Needed   Ibuprofen-Acetaminophen (ADVIL DUAL ACTION) 125-250 MG TABS Take 2 tablets by mouth every 6 (six) hours as needed (pain).   Taking  As Needed   pseudoephedrine (SUDAFED) 30 MG tablet Take 60 mg by mouth every 4 (four) hours as needed for congestion.   Taking As Needed    Review of Systems  Constitutional: Negative.   Respiratory: Negative.    Cardiovascular: Negative.   Gastrointestinal: Negative.   Genitourinary: Negative.     Blood pressure 114/72, pulse 87, temperature 97.9 F (36.6 C), temperature source Oral, resp. rate 18, height 5\' 2"  (1.575 m), weight 111.1 kg, last menstrual period 08/08/2023,  SpO2 99%. Physical Exam Constitutional:      Appearance: Normal appearance.  Cardiovascular:     Rate and Rhythm: Normal rate and regular rhythm.  Pulmonary:     Effort: Pulmonary effort is normal.     Breath sounds: Normal breath sounds.  Skin:    General: Skin is warm and dry.  Neurological:     General: No focal deficit present.     Mental Status: She is alert.  Psychiatric:        Mood and Affect: Mood normal.        Behavior: Behavior normal.     No results found for this or any previous visit (from the past 24 hours).  No results found.  Assessment/Plan: 74 you G3P2 MAAF with fibroid uterus, menorrhagia, umbilical mass, h/o PE that was noted incidentally on CT who was treated for 6 months here for definitive surgery with TLH/possible oophrectomy, bilateral salpingectomy, cystoscopy.  Questions answered.  Pt ready to proceed.  Jerene Bears 08/25/2023, 10:41 AM

## 2023-08-25 NOTE — Anesthesia Procedure Notes (Signed)
 Procedure Name: Intubation Date/Time: 08/25/2023 1:06 PM  Performed by: Sudie Grumbling, CRNAPre-anesthesia Checklist: Patient identified, Emergency Drugs available, Suction available and Patient being monitored Patient Re-evaluated:Patient Re-evaluated prior to induction Oxygen Delivery Method: Circle system utilized Preoxygenation: Pre-oxygenation with 100% oxygen Induction Type: IV induction Ventilation: Mask ventilation without difficulty Laryngoscope Size: Mac and 4 Grade View: Grade I Tube type: Oral Tube size: 7.0 mm Number of attempts: 1 Airway Equipment and Method: Stylet and Oral airway Placement Confirmation: ETT inserted through vocal cords under direct vision, positive ETCO2 and breath sounds checked- equal and bilateral Secured at: 22 cm Tube secured with: Tape Dental Injury: Teeth and Oropharynx as per pre-operative assessment

## 2023-08-25 NOTE — Op Note (Signed)
 08/25/2023  4:39 PM  PATIENT:  Chloe Graves  33 y.o. female  PRE-OPERATIVE DIAGNOSIS:  Menorrhagia, Fibroids, h/o iron deficiency anemia, Umbilical mass  POST-OPERATIVE DIAGNOSIS:  Menorrhagia, Fibroids, h/o iron deficiency anemia, Umbilical mass  PROCEDURE:  Procedure(s): TOTAL LAPAROSCOPIC HYSTERECTOMY, BILATERAL SALPINGECTOMY CYSTOSCOPY EXCISION OF UMBILICAL MASS  SURGEON:  Jerene Bears  ASSISTANTS: Dr. Milas Hock.  An experienced assistant was required given the standard of surgical care given the complexity of the case.  This assistant was needed for exposure, dissection, suctioning, retraction, instrument exchange and for overall help during the procedure.  RNFA help was also unavailable.  ANESTHESIA:   general  ESTIMATED BLOOD LOSS: 50 mL  BLOOD ADMINISTERED:none   FLUIDS: 1600cc LR and 250cc albumin  UOP: 200cc clear UOP  SPECIMEN:  umbilical mass, uterus/cervix and bilateral fallopian tubes  DISPOSITION OF SPECIMEN:  PATHOLOGY  FINDINGS: enlarged and bulky uterus, normal ovaries, scarring on lower portion of uterus from prior c sections, normal upper abdomen  DESCRIPTION OF OPERATION: Patient is taken to the operating room. She is placed in the supine position. She is a running IV in place. Informed consent was present on the chart. SCDs on her lower extremities and functioning properly. Her arms were tucked by the side.  General endotracheal anesthesia was administered by the anesthesia staff without difficulty. Dr. Sandford Craze, anesthesia, oversaw case.  Time out performed.    Her legs were placed in the low lithotomy position in [place] stirrups. Clora prep was then used to prep the abdomen and Hibiclens was used to prep the inner thighs, perineum and vagina. Once 3 minutes had past the patient was draped in a normal standard fashion. The legs were lifted to the high lithotomy position. The cervix was visualized by placing a heavy weighted speculum in the  posterior aspect of the vagina and using a curved Deaver retractor to the retract anteriorly. The anterior lip of the cervix was grasped with single-tooth tenaculum.  The cervix sounded to 10 cm. Pratt dilators were used to dilate the cervix up to a #21. A RUMI uterine manipulator was obtained. A #10 disposable tip was placed on the RUMI manipulator as well as a 3.0, silver KOH ring. This was passed through the cervix and the bulb of the disposable tip was inflated with 10 cc of normal saline. There was a good fit of the KOH ring around the cervix. The tenaculum was removed. There is also good manipulation of the uterus. The speculum and retractor were removed as well. A Foley catheter was placed to straight drain.  Clear urine was noted. Legs were lowered to the low lithotomy position and attention was turned the abdomen.  Dr. Dwain Sarna was present at this time and incised the umbilical mass.  This is dictated separately.  With the dissection, the fascia was opened so when he was done with the procedure, I was able to use the small fascial incision and grasp the edge with Kochers.  #0 Vicryl was placed at the corners.  Peritoneum was entered and a #5 trochar was placed.  CO2 gas was attached and the pneumoperitoneum was achieved without difficulty. Findings noted above.  Locations for RLQ, LLQ, and suprapubic ports were noted by transillumination of the abdominal wall.  0.25% marcaine was used to anesthetize the skin.  8mm skin incision was made in the RLQ and an AirSeal port was placed underdirect visualization of the laparoscope.  Then a 5mm skin incision was made and a 5mm nonbladed trochar  and port was placed in the LLQ.  Finally, and 8mm skin incision was made about 4cm above the pubic symphasis and an 8mm non-bladed port was placed with direct visualization of the laparoscope.  All trochars were removed.    Ureters were identified.  Attention was turned to the left side. With uterus on stretch the left  tube was excised off the ovary and mesosalpinx was dissected to free the tube. The left utero-ovarian pedicle was thickened so the round ligament was cauterized and incised.  The anterior and posterior peritoneum of the inferior leaf of the broad ligament were opened. The beginning of the bladder flap was created but there was significant scarring from prior c sections.  The bladder was backfilled with NS to ensure where the edge was exactly on the cervix.  Once the edge was more clearly seen, the bladder was taken down below the level of the KOH ring. The left uterine artery skeletonized and then just superior to the KOH ring this vessel was serially clamped, cauterized, and incised.  Bladder was placed to straight drain again.  Attention was turned the right side.  The uterus was placed on stretch to the opposite side.  The tube was excised off the ovary using sharp dissection a bipolar cautery.  The mesosalpinx was incised freeing the tube. Then the right uterine ovarian pedicle was serially clamped cauterized and incised. Next the right round ligament was serially clamped cauterized and incised. The anterior posterior peritoneum of the inferiorly for the broad ligament were opened. The anterior peritoneum was carried across to the dissection on the left side. The remainder of the bladder flap was created using sharp dissection. The bladder was well below the level of the KOH ring. The right uterine artery skeletonized. Then the right uterine artery, above the level of the KOH ring, was serially clamped cauterized and incised.   Attention was turned back to the left side.  The utero ovarian pedicle was serially clamped, cauterized and incised with the the ligasure device. The left uterine artery skeletonized and then just superior to the KOH ring this vessel was serially clamped, cauterized, and incised.  The uterus was devascularized at this point.  The colpotomy was performed a starting in the midline and  using a harmonic scalpel with the inferior edge of the open blade  This was carried around a circumferential fashion until the vaginal mucosa was completely incised in the specimen was freed.  The specimen was then delivered to the vagina.  A vaginal occlusive device was used to maintain the pneumoperitoneum  Instruments were changed with a needle driver and Kobra graspers.  Using a 9 inch V. lock suture, the cuff was closed by incorporating the anterior and posterior vaginal mucosa in each stitch. This was carried across all the way to the left corner and a running fashion. Two stitches were brought back towards the midline and the suture was cut flush with the vagina. The needle was brought out the pelvis. The pelvis was irrigated. All pedicles were inspected. No bleeding was noted.   Co2 pressures were lowered to 8mm Hg.  Again, no bleeding was noted.  Ureters were noted deep in the pelvis to be peristalsing.  Arista was placed along the pedicles.  At this point the procedure was completed.  The remaining instruments were removed.  The ports (except the suprapubic port) were removed under direct visualization of the laparoscope and the pneumoperitoneum was relieved.  The patient was taken out of Trendelenburg  positioning.  Several deep breaths were given to the patient's trying to any gas the abdomen and finally the suprapubic port was removed.  The umbilicus was closed first with deeper suture of 2.0 Vicryl to close the space created from the excision of the umbilical mass.  The skin was then closed at all incision sites with subcuticular stitches of 3-0 Vicryl. The skin was cleansed Dermabond was applied. Attention was then turned the vagina and the cuff was inspected. No bleeding was noted. The anterior posterior vaginal mucosa was incorporated in each stitch. The Foley catheter was removed.  Cystoscopy was performed.  No sutures or bladder injuries were noted.  Ureters were noted with normal urine jets  from each one was seen.  Foley was left out after the cystoscopic fluid was drained and cystoscope removed.  Sponge, lap, needle, instrument counts were correct x2. Patient tolerated the procedure very well. She was awakened from anesthesia, extubated and taken to recovery in stable condition.   COUNTS:  YES  PLAN OF CARE: Transfer to PACU

## 2023-08-25 NOTE — Op Note (Signed)
 Preoperative diagnosis: Umbilical mass, likely endometrioma Postoperative diagnosis: Same as above Procedure: Excision of 3 x 3 cm umbilical mass Surgeon: Dr. Harden Mo Co-surgeon Dr. Leda Quail Estimated blood loss: Minimal Specimens: Umbilical mass to pathology Complications: None Disposition Case turned over to Dr. Hyacinth Meeker  Indications: This is a 33 year old female whose had an umbilical mass for about 1 year.  This area is bleeding during her menstrual period she had a CT scan last year that showed a 2.7 cm mass enhancing in the umbilicus.  She also was noted to have a pulmonary embolus at that point.  She has been treated appropriately for that and now returns to have the mass excised due to the symptoms.  She is also due for hysterectomy we plan to combine those procedures.  Procedure: After informed consent was obtained she was taken to the operating room.  She is given antibiotics.  SCDs were placed.  She was placed under general anesthesia without complication.  She was prepped and draped in standard sterile surgical fashion.  Surgical timeout was then performed.  I did my portion first.  I made a curvilinear incision below the umbilicus.  I lifted the umbilical stalk up as much as I could.  The mass was adherent to the very base of the umbilicus.  The mass clinically did appear to be an endometrioma.  I was able to dissect around it and not enter into it.  I was able to remove the entire mass and a small portion of the base of the umbilicus.  This was passed off the table as a specimen.  This looked to be about 3 x 3 cm.  I then repaired the small hole in the umbilicus with 4-0 Vicryl suture.  I then turned the case over to Dr. Hyacinth Meeker for her portion.

## 2023-08-26 ENCOUNTER — Other Ambulatory Visit (HOSPITAL_COMMUNITY): Payer: Self-pay

## 2023-08-26 ENCOUNTER — Encounter (HOSPITAL_COMMUNITY): Payer: Self-pay | Admitting: Obstetrics & Gynecology

## 2023-08-26 ENCOUNTER — Encounter: Payer: Self-pay | Admitting: Hematology and Oncology

## 2023-08-26 DIAGNOSIS — Z7901 Long term (current) use of anticoagulants: Secondary | ICD-10-CM | POA: Diagnosis not present

## 2023-08-26 DIAGNOSIS — N80129 Deep endometriosis of ovary, unspecified ovary: Secondary | ICD-10-CM | POA: Diagnosis not present

## 2023-08-26 DIAGNOSIS — N920 Excessive and frequent menstruation with regular cycle: Secondary | ICD-10-CM | POA: Diagnosis not present

## 2023-08-26 DIAGNOSIS — I2699 Other pulmonary embolism without acute cor pulmonale: Secondary | ICD-10-CM | POA: Diagnosis not present

## 2023-08-26 DIAGNOSIS — Z86718 Personal history of other venous thrombosis and embolism: Secondary | ICD-10-CM | POA: Diagnosis not present

## 2023-08-26 DIAGNOSIS — Z9851 Tubal ligation status: Secondary | ICD-10-CM | POA: Diagnosis not present

## 2023-08-26 DIAGNOSIS — I10 Essential (primary) hypertension: Secondary | ICD-10-CM | POA: Diagnosis not present

## 2023-08-26 DIAGNOSIS — N8003 Adenomyosis of the uterus: Secondary | ICD-10-CM | POA: Diagnosis not present

## 2023-08-26 DIAGNOSIS — Z87891 Personal history of nicotine dependence: Secondary | ICD-10-CM | POA: Diagnosis not present

## 2023-08-26 DIAGNOSIS — I272 Pulmonary hypertension, unspecified: Secondary | ICD-10-CM | POA: Diagnosis not present

## 2023-08-26 DIAGNOSIS — D259 Leiomyoma of uterus, unspecified: Secondary | ICD-10-CM | POA: Diagnosis not present

## 2023-08-26 LAB — BASIC METABOLIC PANEL WITH GFR
Anion gap: 9 (ref 5–15)
BUN: 5 mg/dL — ABNORMAL LOW (ref 6–20)
CO2: 19 mmol/L — ABNORMAL LOW (ref 22–32)
Calcium: 8.2 mg/dL — ABNORMAL LOW (ref 8.9–10.3)
Chloride: 108 mmol/L (ref 98–111)
Creatinine, Ser: 0.93 mg/dL (ref 0.44–1.00)
GFR, Estimated: >60 mL/min (ref >60)
Glucose, Bld: 151 mg/dL — ABNORMAL HIGH (ref 70–99)
Potassium: 4.1 mmol/L (ref 3.5–5.1)
Sodium: 136 mmol/L (ref 135–145)

## 2023-08-26 LAB — CBC
HCT: 35.7 % — ABNORMAL LOW (ref 36.0–46.0)
Hemoglobin: 12 g/dL (ref 12.0–15.0)
MCH: 30.6 pg (ref 26.0–34.0)
MCHC: 33.6 g/dL (ref 30.0–36.0)
MCV: 91.1 fL (ref 80.0–100.0)
Platelets: 319 10*3/uL (ref 150–400)
RBC: 3.92 MIL/uL (ref 3.87–5.11)
RDW: 14.2 % (ref 11.5–15.5)
WBC: 10 10*3/uL (ref 4.0–10.5)
nRBC: 0 % (ref 0.0–0.2)

## 2023-08-26 MED ORDER — IBUPROFEN 800 MG PO TABS
800.0000 mg | ORAL_TABLET | Freq: Three times a day (TID) | ORAL | 0 refills | Status: DC | PRN
  Filled 2023-08-26: qty 30, 10d supply, fill #0

## 2023-08-26 MED ORDER — ENOXAPARIN SODIUM 60 MG/0.6ML IJ SOSY
60.0000 mg | PREFILLED_SYRINGE | INTRAMUSCULAR | 0 refills | Status: DC
  Filled 2023-08-26: qty 6, 10d supply, fill #0

## 2023-08-26 MED ORDER — OXYCODONE-ACETAMINOPHEN 5-325 MG PO TABS
2.0000 | ORAL_TABLET | ORAL | 0 refills | Status: AC | PRN
  Filled 2023-08-26: qty 30, 3d supply, fill #0

## 2023-08-26 NOTE — Progress Notes (Signed)
 1 Day Post-Op Procedure(s) (LRB): TOTAL LAPAROSCOPIC HYSTERECTOMY, BILATERAL SALPINGECTOMY (Bilateral) CYSTOSCOPY (N/A) EXCISION OF UMBILICAL MASS (N/A)  Subjective: Patient reports no nausea and much better pain control.  Has eaten and walked.  Voiding easily.  Does feel a little pelvic pressure.  Denies vaginal bleeding.   Objective: I have reviewed patient's vital signs, intake and output, medications, and labs. Today's Vitals   08/25/23 1917 08/25/23 2000 08/26/23 0012 08/26/23 0423  BP: (!) 120/58  111/67 (!) 98/47  Pulse: 74  78 84  Resp: 17  16 18   Temp:   98.2 F (36.8 C) 98.4 F (36.9 C)  TempSrc:   Oral Oral  SpO2: 99%   99%  Weight:      Height:      PainSc: 3  5      Body mass index is 44.81 kg/m.  Post ob hb:  12.0   General: alert and no distress Resp: clear to auscultation bilaterally Cardio: regular rate and rhythm, S1, S2 normal, no murmur, click, rub or gallop GI: soft, appropriately tender, good BS, incisions C/D/I except umbilical incision still has dressing on Extremities: extremities normal, atraumatic, no cyanosis or edema Vaginal Bleeding: none  Assessment: s/p Procedure(s): TOTAL LAPAROSCOPIC HYSTERECTOMY, BILATERAL SALPINGECTOMY (Bilateral) CYSTOSCOPY (N/A) EXCISION OF UMBILICAL MASS (N/A): progressing well  Plan: Advance to PO medication Discontinue IV fluids Plan lovenox teaching today and hopefully discharge later this morning  LOS: 0 days    Jerene Bears, MD 08/26/2023, 8:02 AM

## 2023-08-26 NOTE — Plan of Care (Signed)

## 2023-08-27 LAB — SURGICAL PATHOLOGY

## 2023-08-28 DIAGNOSIS — G8918 Other acute postprocedural pain: Secondary | ICD-10-CM

## 2023-08-28 DIAGNOSIS — N80129 Deep endometriosis of ovary, unspecified ovary: Secondary | ICD-10-CM

## 2023-09-08 ENCOUNTER — Encounter (HOSPITAL_BASED_OUTPATIENT_CLINIC_OR_DEPARTMENT_OTHER): Payer: Self-pay | Admitting: Obstetrics & Gynecology

## 2023-09-08 ENCOUNTER — Ambulatory Visit (HOSPITAL_BASED_OUTPATIENT_CLINIC_OR_DEPARTMENT_OTHER): Payer: Self-pay | Admitting: Obstetrics & Gynecology

## 2023-09-08 VITALS — BP 135/101 | HR 78 | Ht 62.0 in | Wt 246.0 lb

## 2023-09-08 DIAGNOSIS — Z9889 Other specified postprocedural states: Secondary | ICD-10-CM

## 2023-09-08 DIAGNOSIS — Z86711 Personal history of pulmonary embolism: Secondary | ICD-10-CM

## 2023-09-08 DIAGNOSIS — N80129 Deep endometriosis of ovary, unspecified ovary: Secondary | ICD-10-CM

## 2023-09-08 NOTE — Progress Notes (Signed)
 GYNECOLOGY  VISIT  CC:   post op recheck  HPI: 33 y.o. G3P0212 Married [place] or [place] American female here for recheck after undergoing TLH/bilateral salpingectomy, cystoscopy and resection of umbilical mass on 08/25/2023.  She reports bleeding is minimal.  She still umbilical pain.  She is not taking narcotics but using ibuprofen .  Bowel function is  good but she is having some constipation.  Using miralax some .  Bladder function is normal.  Did completed full 10 days post op of lovenox .    Pathology reviewed:  Yes .  Questions answered.    MEDS:   Current Outpatient Medications on File Prior to Visit  Medication Sig Dispense Refill   cyanocobalamin  (VITAMIN B12) 1000 MCG tablet Take 1 tablet (1,000 mcg total) by mouth daily. 130 tablet 0   Acetaminophen  (MIDOL  PO) Take 2 tablets by mouth every 6 (six) hours as needed (pain). (Patient not taking: Reported on 09/08/2023)     ibuprofen  (ADVIL ) 800 MG tablet Take 1 tablet (800 mg total) by mouth every 8 (eight) hours as needed. (Patient not taking: Reported on 09/08/2023) 30 tablet 0   No current facility-administered medications on file prior to visit.    SH:  Smoking No    PHYSICAL EXAMINATION:    BP (!) 135/101 (BP Location: Right Arm, Patient Position: Sitting, Cuff Size: Large)   Pulse 78   Ht 5\' 2"  (1.575 m)   Wt 246 lb (111.6 kg)   LMP 08/08/2023 (Approximate)   BMI 44.99 kg/m     General appearance: alert, cooperative and appears stated age CV:  Regular rate and rhythm Lungs:  clear to auscultation, no wheezes, rales or rhonchi, symmetric air entry Abdomen: soft, non-tender; bowel sounds normal; no masses,  no organomegaly Incisions:  C/D/I  Pelvic: External genitalia:  no lesions              Urethra:  normal appearing urethra with no masses, tenderness or lesions              Bartholins and Skenes: normal                 Vagina: normal appearing vagina with normal color and discharge, no lesions              Cervix:  absent              Bimanual Exam:  Uterus:  uterus absent  Chaperone, Myrtie Atkinson, was present for exam.  Assessment/Plan: 1. Post-operative state (Primary) - Appropriate healing.  Recheck 3 weeks.  2. 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  Incision healing well.  Has follow up tomorrow with him.  3. History of pulmonary embolism - has completed 10 days of post op lovenox .

## 2023-09-30 ENCOUNTER — Ambulatory Visit (INDEPENDENT_AMBULATORY_CARE_PROVIDER_SITE_OTHER): Payer: Self-pay | Admitting: Obstetrics & Gynecology

## 2023-09-30 ENCOUNTER — Encounter (HOSPITAL_BASED_OUTPATIENT_CLINIC_OR_DEPARTMENT_OTHER): Payer: Self-pay | Admitting: Obstetrics & Gynecology

## 2023-09-30 VITALS — BP 120/80 | HR 75 | Ht 62.0 in | Wt 250.0 lb

## 2023-09-30 DIAGNOSIS — D500 Iron deficiency anemia secondary to blood loss (chronic): Secondary | ICD-10-CM

## 2023-09-30 DIAGNOSIS — F4322 Adjustment disorder with anxiety: Secondary | ICD-10-CM

## 2023-09-30 DIAGNOSIS — D251 Intramural leiomyoma of uterus: Secondary | ICD-10-CM | POA: Diagnosis not present

## 2023-09-30 DIAGNOSIS — N80129 Deep endometriosis of ovary, unspecified ovary: Secondary | ICD-10-CM

## 2023-09-30 DIAGNOSIS — Z90710 Acquired absence of both cervix and uterus: Secondary | ICD-10-CM

## 2023-09-30 DIAGNOSIS — F419 Anxiety disorder, unspecified: Secondary | ICD-10-CM

## 2023-09-30 MED ORDER — SERTRALINE HCL 50 MG PO TABS: 50.0000 mg | ORAL_TABLET | Freq: Every day | ORAL | 1 refills | Status: DC

## 2023-09-30 NOTE — Progress Notes (Signed)
 GYNECOLOGY  VISIT  CC:   post op recheck  HPI: 33 y.o. G3P0212 Married [place] or [place] American female here for recheck after undergoing TLH/bilateral salpingectomy/cystoscopy on 08/25/2023.  She reports bleeding is none.  She has no pain.  Bowel function is Normal.  Bladder function is normal.    She has started exercising again.  Wants to work towards running again.  Has been having some episodes of feeling overwhelmed at times, sometimes with anxiety, sometimes with anger.  She's not sure if this is normal.  Is worrying a lot.  GAD 7 done today and is 17.       09/30/2023    9:40 AM 11/06/2022    1:16 PM  GAD 7 : Generalized Anxiety Score  Nervous, Anxious, on Edge 2 2  Control/stop worrying 3 0  Worry too much - different things 3 1  Trouble relaxing 2 1  Restless 2 0  Easily annoyed or irritable 2 1  Afraid - awful might happen 3 1  Total GAD 7 Score 17 6  Anxiety Difficulty Somewhat difficult Somewhat difficult      MEDS:   Current Outpatient Medications on File Prior to Visit  Medication Sig Dispense Refill   Acetaminophen  (MIDOL  PO) Take 2 tablets by mouth every 6 (six) hours as needed (pain). (Patient not taking: Reported on 09/08/2023)     cyanocobalamin  (VITAMIN B12) 1000 MCG tablet Take 1 tablet (1,000 mcg total) by mouth daily. (Patient not taking: Reported on 09/30/2023) 130 tablet 0   ibuprofen  (ADVIL ) 800 MG tablet Take 1 tablet (800 mg total) by mouth every 8 (eight) hours as needed. (Patient not taking: Reported on 09/30/2023) 30 tablet 0   No current facility-administered medications on file prior to visit.    SH:  Smoking No    PHYSICAL EXAMINATION:    BP 120/80 (BP Location: Right Arm, Patient Position: Sitting)   Pulse 75   Ht 5\' 2"  (1.575 m)   Wt 250 lb (113.4 kg)   LMP 08/08/2023 (Approximate)   BMI 45.73 kg/m     General appearance: alert, cooperative and appears stated age CV:  Regular rate and rhythm Lungs:  clear to auscultation, no wheezes,  rales or rhonchi, symmetric air entry Abdomen: soft, non-tender; bowel sounds normal; no masses,  no organomegaly Incisions:  C/D/I  Pelvic: External genitalia:  no lesions              Urethra:  normal appearing urethra with no masses, tenderness or lesions              Bartholins and Skenes: normal                 Vagina: normal appearing vagina with normal color and discharge, no lesions              Cervix: absent              Bimanual Exam:  Uterus:  uterus absent              Chaperone, Myrtie Atkinson, CMA, was present for exam.  Assessment/Plan: 1. H/O: hysterectomy (Primary) - doing well.  Activity changes discussed.  Pt aware can be sexually active on 11/17/2023 or after.  2. Endometrioma - umbilical incision healing well  3. Iron  deficiency anemia due to chronic blood loss - hb was 12.0 post op - has follow up with Dr. Arno Bibles  4. Anxiety - sertraline (ZOLOFT) 50 MG tablet; Take 1 tablet (50 mg total)  by mouth daily.  Dispense: 30 tablet; Refill: 1 - Ambulatory referral to [facility]  6. Adjustment disorder with anxious mood - Ambulatory referral to [facility]

## 2023-10-08 ENCOUNTER — Encounter (HOSPITAL_BASED_OUTPATIENT_CLINIC_OR_DEPARTMENT_OTHER): Payer: Self-pay | Admitting: Obstetrics & Gynecology

## 2023-10-29 ENCOUNTER — Encounter (HOSPITAL_BASED_OUTPATIENT_CLINIC_OR_DEPARTMENT_OTHER): Payer: Self-pay | Admitting: Obstetrics & Gynecology

## 2023-10-29 ENCOUNTER — Telehealth (HOSPITAL_BASED_OUTPATIENT_CLINIC_OR_DEPARTMENT_OTHER): Admitting: Obstetrics & Gynecology

## 2023-10-29 DIAGNOSIS — F4322 Adjustment disorder with anxiety: Secondary | ICD-10-CM | POA: Diagnosis not present

## 2023-10-29 DIAGNOSIS — N80129 Deep endometriosis of ovary, unspecified ovary: Secondary | ICD-10-CM | POA: Diagnosis not present

## 2023-10-29 NOTE — Progress Notes (Signed)
 Virtual Visit via Video Note  I connected with Chloe Graves on 10/29/23 at  8:35 AM EDT by a video enabled telemedicine application and verified that I am speaking with the correct person using two identifiers.  Location: Patient: home Provider: office   I discussed the limitations of evaluation and management by telemedicine and the availability of in person appointments. The patient expressed understanding and agreed to proceed.  History of Present Illness: 33 yo G3P2 MAAF here for follow up after starting zoloft  at her post op appt.  She was having anxiety issues and difficulty sleeping.  She feels she is much more even and managing her emotions much better.  Sleep is much better.     Observations/Objective: WNWD AAF NAD     10/29/2023    9:06 AM 09/30/2023    9:40 AM 11/06/2022    1:16 PM  GAD 7 : Generalized Anxiety Score  Nervous, Anxious, on Edge 1 2 2   Control/stop worrying 1 3 0  Worry too much - different things 2 3 1   Trouble relaxing 1 2 1   Restless 0 2 0  Easily annoyed or irritable 1 2 1   Afraid - awful might happen 1 3 1   Total GAD 7 Score 7 17 6   Anxiety Difficulty Somewhat difficult Somewhat difficult Somewhat difficult     Assessment and Plan: 1. Adjustment disorder with anxious mood (Primary) - doing much better.  Discussed increasing dosage a little more to 75mg  due to GAD score of 7.  She would like to continue the 50mg  dosage for one more month and then reach out.  She will plan to communicate through mychart.   Follow Up Instructions: I discussed the assessment and treatment plan with the patient. The patient was provided an opportunity to ask questions and all were answered. The patient agreed with the plan and demonstrated an understanding of the instructions.   The patient was advised to call back or seek an in-person evaluation if the symptoms worsen or if the condition fails to improve as anticipated.  I provided 13 minutes of  non-face-to-face time during this encounter.   Lillian Rein, MD

## 2023-11-13 ENCOUNTER — Telehealth: Payer: Self-pay

## 2023-11-13 ENCOUNTER — Other Ambulatory Visit: Payer: Self-pay | Admitting: *Deleted

## 2023-11-13 DIAGNOSIS — D509 Iron deficiency anemia, unspecified: Secondary | ICD-10-CM

## 2023-11-13 DIAGNOSIS — D62 Acute posthemorrhagic anemia: Secondary | ICD-10-CM

## 2023-11-13 NOTE — Telephone Encounter (Signed)
 Verbally confirmed appt for 6/30

## 2023-11-16 ENCOUNTER — Other Ambulatory Visit: Payer: Self-pay | Admitting: Hematology and Oncology

## 2023-11-16 ENCOUNTER — Inpatient Hospital Stay: Payer: BC Managed Care – PPO | Attending: Hematology and Oncology

## 2023-11-16 ENCOUNTER — Inpatient Hospital Stay (HOSPITAL_BASED_OUTPATIENT_CLINIC_OR_DEPARTMENT_OTHER): Payer: BC Managed Care – PPO | Admitting: Hematology and Oncology

## 2023-11-16 ENCOUNTER — Encounter: Payer: Self-pay | Admitting: Hematology and Oncology

## 2023-11-16 VITALS — BP 116/44 | HR 87 | Temp 98.5°F | Resp 18 | Ht 62.0 in | Wt 250.7 lb

## 2023-11-16 DIAGNOSIS — D62 Acute posthemorrhagic anemia: Secondary | ICD-10-CM

## 2023-11-16 DIAGNOSIS — Z9079 Acquired absence of other genital organ(s): Secondary | ICD-10-CM | POA: Diagnosis not present

## 2023-11-16 DIAGNOSIS — Z87891 Personal history of nicotine dependence: Secondary | ICD-10-CM | POA: Diagnosis not present

## 2023-11-16 DIAGNOSIS — Z86711 Personal history of pulmonary embolism: Secondary | ICD-10-CM | POA: Insufficient documentation

## 2023-11-16 DIAGNOSIS — Z90710 Acquired absence of both cervix and uterus: Secondary | ICD-10-CM | POA: Insufficient documentation

## 2023-11-16 DIAGNOSIS — D509 Iron deficiency anemia, unspecified: Secondary | ICD-10-CM | POA: Diagnosis not present

## 2023-11-16 DIAGNOSIS — Z8759 Personal history of other complications of pregnancy, childbirth and the puerperium: Secondary | ICD-10-CM | POA: Insufficient documentation

## 2023-11-16 DIAGNOSIS — F32A Depression, unspecified: Secondary | ICD-10-CM | POA: Insufficient documentation

## 2023-11-16 DIAGNOSIS — Z79899 Other long term (current) drug therapy: Secondary | ICD-10-CM | POA: Insufficient documentation

## 2023-11-16 DIAGNOSIS — I2699 Other pulmonary embolism without acute cor pulmonale: Secondary | ICD-10-CM | POA: Diagnosis not present

## 2023-11-16 LAB — FERRITIN: Ferritin: 93 ng/mL (ref 11–307)

## 2023-11-16 LAB — CMP (CANCER CENTER ONLY)
ALT: 12 U/L (ref 0–44)
AST: 17 U/L (ref 15–41)
Albumin: 4.2 g/dL (ref 3.5–5.0)
Alkaline Phosphatase: 71 U/L (ref 38–126)
Anion gap: 7 (ref 5–15)
BUN: 10 mg/dL (ref 6–20)
CO2: 24 mmol/L (ref 22–32)
Calcium: 9.4 mg/dL (ref 8.9–10.3)
Chloride: 108 mmol/L (ref 98–111)
Creatinine: 0.92 mg/dL (ref 0.44–1.00)
GFR, Estimated: >60 mL/min (ref >60)
Glucose, Bld: 95 mg/dL (ref 70–99)
Potassium: 4.1 mmol/L (ref 3.5–5.1)
Sodium: 139 mmol/L (ref 135–145)
Total Bilirubin: 0.5 mg/dL (ref 0.0–1.2)
Total Protein: 7.3 g/dL (ref 6.5–8.1)

## 2023-11-16 LAB — CBC WITH DIFFERENTIAL (CANCER CENTER ONLY)
Abs Immature Granulocytes: 0.01 10*3/uL (ref 0.00–0.07)
Basophils Absolute: 0 10*3/uL (ref 0.0–0.1)
Basophils Relative: 1 %
Eosinophils Absolute: 0.1 10*3/uL (ref 0.0–0.5)
Eosinophils Relative: 1 %
HCT: 41.9 % (ref 36.0–46.0)
Hemoglobin: 14.2 g/dL (ref 12.0–15.0)
Immature Granulocytes: 0 %
Lymphocytes Relative: 41 %
Lymphs Abs: 2.3 10*3/uL (ref 0.7–4.0)
MCH: 29.6 pg (ref 26.0–34.0)
MCHC: 33.9 g/dL (ref 30.0–36.0)
MCV: 87.3 fL (ref 80.0–100.0)
Monocytes Absolute: 0.3 10*3/uL (ref 0.1–1.0)
Monocytes Relative: 6 %
Neutro Abs: 2.9 10*3/uL (ref 1.7–7.7)
Neutrophils Relative %: 51 %
Platelet Count: 322 10*3/uL (ref 150–400)
RBC: 4.8 MIL/uL (ref 3.87–5.11)
RDW: 15.4 % (ref 11.5–15.5)
WBC Count: 5.6 10*3/uL (ref 4.0–10.5)
nRBC: 0 % (ref 0.0–0.2)

## 2023-11-16 LAB — IRON AND IRON BINDING CAPACITY (CC-WL,HP ONLY)
Iron: 100 ug/dL (ref 28–170)
Saturation Ratios: 29 % (ref 10.4–31.8)
TIBC: 350 ug/dL (ref 250–450)
UIBC: 250 ug/dL (ref 148–442)

## 2023-11-16 NOTE — Progress Notes (Signed)
 [facility] CONSULT NOTE  Patient Care Team: Patient, No Pcp Per as PCP - General (General Practice) Dillard, Ovid, MD (Obstetrics and Gynecology)  CHIEF COMPLAINTS/PURPOSE OF CONSULTATION:  IDA and recent PE  ASSESSMENT & PLAN:   This is a very pleasant 33 year old female patient with new diagnosis of iron  deficiency anemia and recent pulmonary embolism referred to hematology for additional recommendations.  Assessment and Plan Assessment & Plan IDA Anemia resolved post-hysterectomy. Hemoglobin at 14.2 indicates recovery. - Iron  panel and ferritin reviewed, ferritin 93, No indication for iron  supplementation  Brittle toenails  No pica symptoms. - Recommend biotin supplement for hair and nail health.  Depression Started on Zoloft  with improvement. Currently on a low dose. - Monitor response to Zoloft . - Consider dose increase if needed.   She will return to clinic in 6 months with labs. HISTORY OF PRESENTING ILLNESS:  Chloe Graves 33 y.o. female is here because of IDA and PE  This is a very pleasant 32 yr pre menopausal female patient with recent ED admission for severe anemia and incidental finding of PE referred to hematology for the same.  Discussed the use of AI scribe software for clinical note transcription with the patient, who gave verbal consent to proceed.  History of Present Illness Chloe Graves is a 33 year old female who presents for follow-up after a recent hysterectomy.  She underwent a hysterectomy in April due to her history of anemia. Her hemoglobin level is currently 14.2, which is the highest it has been since she started iron  supplementation. She has discontinued iron  supplements to observe her body's response.  She has started taking Zoloft  at a low dose to manage anxiety symptoms, which she describes as a 'spiral' when experiencing minor chest aches. Zoloft  is helping her feel better.  She has noticed increased  brittleness in her toenails, with one toenail detaching after a run. No cravings for ice or other non-food substances. She is not currently taking any iron  supplements.  She has resumed working out and returned to work, which she finds stressful. No new symptoms of deep vein thrombosis or pulmonary embolism, such as leg swelling, chest pain, or shortness of breath.   All other systems were reviewed with the patient and are negative.  MEDICAL HISTORY:  Past Medical History:  Diagnosis Date   H/O rubella    H/O varicella    History of pregnancy induced hypertension    IDA (iron  deficiency anemia)    Preeclampsia    h/o severe preeclampsia with pregnancies   Pulmonary embolism (HCC) 2024   per pt she completed Eliquis  last december    SURGICAL HISTORY: Past Surgical History:  Procedure Laterality Date   CESAREAN SECTION  11/16/2011   Procedure: CESAREAN SECTION;  Surgeon: Ovid DELENA All, MD;  Location: [facility] ORS;  Service: Gynecology;  Laterality: N/A;  Primary cesarean section with delivery of baby boy at 35.  Apgars 6/8.   CESAREAN SECTION N/A 07/14/2018   Procedure: CESAREAN SECTION;  Surgeon: Rutherford Gain, MD;  Location: [facility] OR[facility];  Service: Obstetrics;  Laterality: N/A;   CYSTOSCOPY N/A 08/25/2023   Procedure: CYSTOSCOPY;  Surgeon: Cleotilde Ronal RAMAN, MD;  Location: [facility] OR;  Service: Gynecology;  Laterality: N/A;   LAPAROSCOPIC TUBAL LIGATION Bilateral 12/06/2018   Procedure: LAPAROSCOPIC TUBAL LIGATION With Bipolar Cautery;  Surgeon: Rutherford Gain, MD;  Location: [facility];  Service: Gynecology;  Laterality: Bilateral;   MASS EXCISION N/A 08/25/2023   Procedure: EXCISION OF  UMBILICAL MASS;  Surgeon: Ebbie Cough, MD;  Location: [facility] OR;  Service: General;  Laterality: N/A;   TOTAL LAPAROSCOPIC HYSTERECTOMY WITH BILATERAL SALPINGO OOPHORECTOMY Bilateral 08/25/2023   Procedure: TOTAL LAPAROSCOPIC HYSTERECTOMY, BILATERAL SALPINGECTOMY;  Surgeon: Cleotilde Ronal RAMAN,  MD;  Location: [facility] OR;  Service: Gynecology;  Laterality: Bilateral;   WISDOM TOOTH EXTRACTION      SOCIAL HISTORY: Social History   Socioeconomic History   Marital status: Married    Spouse name: Not on file   Number of children: Not on file   Years of education: Not on file   Highest education level: Not on file  Occupational History   Not on file  Tobacco Use   Smoking status: Former    Current packs/day: 0.00    Types: Cigarettes    Start date: 12/02/2015    Quit date: 12/01/2016    Years since quitting: 6.9   Smokeless tobacco: Never  Vaping Use   Vaping status: Never Used  Substance and Sexual Activity   Alcohol use: Yes    Alcohol/week: 1.0 standard drink of alcohol    Types: 1 Standard drinks or equivalent per week    Comment: occasional   Drug use: Never   Sexual activity: Yes    Birth control/protection: Surgical  Other Topics Concern   Not on file  Social History Narrative   ** Merged History Encounter **       Social Drivers of Health   Financial Resource Strain: Not on file  Food Insecurity: No Food Insecurity (08/25/2023)   Hunger Vital Sign    Worried About Running Out of Food in the Last Year: Never true    Ran Out of Food in the Last Year: Never true  Transportation Needs: No Transportation Needs (08/25/2023)   PRAPARE - Administrator, Civil Service (Medical): No    Lack of Transportation (Non-Medical): No  Physical Activity: Not on file  Stress: Not on file  Social Connections: Not on file  Intimate Partner Violence: Not At Risk (08/25/2023)   Humiliation, Afraid, Rape, and Kick questionnaire    Fear of Current or Ex-Partner: No    Emotionally Abused: No    Physically Abused: No    Sexually Abused: No    FAMILY HISTORY: Family History  Problem Relation Age of Onset   Cancer Maternal Aunt    Cancer Maternal Grandmother     ALLERGIES:  is allergic to latex.  MEDICATIONS:  Current Outpatient Medications  Medication Sig Dispense  Refill   Acetaminophen  (MIDOL  PO) Take 2 tablets by mouth every 6 (six) hours as needed (pain). (Patient not taking: Reported on 10/29/2023)     cyanocobalamin  (VITAMIN B12) 1000 MCG tablet Take 1 tablet (1,000 mcg total) by mouth daily. (Patient not taking: Reported on 10/29/2023) 130 tablet 0   ibuprofen  (ADVIL ) 800 MG tablet Take 1 tablet (800 mg total) by mouth every 8 (eight) hours as needed. (Patient not taking: Reported on 10/29/2023) 30 tablet 0   sertraline  (ZOLOFT ) 50 MG tablet Take 1 tablet (50 mg total) by mouth daily. 30 tablet 1   No current facility-administered medications for this visit.     PHYSICAL EXAMINATION: ECOG PERFORMANCE STATUS: 0 - Asymptomatic  Vitals:   11/16/23 1116  BP: (!) 116/44  Pulse: 87  Resp: 18  Temp: 98.5 F (36.9 C)  SpO2: 100%     Filed Weights   11/16/23 1116  Weight: 250 lb 11.2 oz (113.7 kg)    Physical Exam  Constitutional:      Appearance: Normal appearance.   Cardiovascular:     Rate and Rhythm: Normal rate and regular rhythm.     Pulses: Normal pulses.     Heart sounds: Normal heart sounds.  Pulmonary:     Effort: Pulmonary effort is normal.     Breath sounds: Normal breath sounds.   Musculoskeletal:        General: Normal range of motion.     Cervical back: Normal range of motion and neck supple. No rigidity.  Lymphadenopathy:     Cervical: No cervical adenopathy.   Skin:    General: Skin is warm and dry.   Neurological:     General: No focal deficit present.     Mental Status: She is alert.   Psychiatric:        Mood and Affect: Mood normal.     Lab Results  Component Value Date   WBC 5.6 11/16/2023   HGB 14.2 11/16/2023   HCT 41.9 11/16/2023   MCV 87.3 11/16/2023   PLT 322 11/16/2023     Chemistry      Component Value Date/Time   NA 136 08/26/2023 0514   K 4.1 08/26/2023 0514   CL 108 08/26/2023 0514   CO2 19 (L) 08/26/2023 0514   BUN 5 (L) 08/26/2023 0514   CREATININE 0.93 08/26/2023 0514    CREATININE 0.94 11/21/2011 2115      Component Value Date/Time   CALCIUM  8.2 (L) 08/26/2023 0514   ALKPHOS 30 (L) 10/30/2022 1148   AST 15 10/30/2022 1148   ALT 15 10/30/2022 1148   BILITOT 0.2 (L) 10/30/2022 1148       RADIOGRAPHIC STUDIES: I have personally reviewed the radiological images as listed and agreed with the findings in the report. No results found.  All questions were answered. The patient knows to call the clinic with any problems, questions or concerns.      Amber Stalls, MD 11/16/2023 11:41 AM

## 2023-11-24 ENCOUNTER — Encounter (HOSPITAL_BASED_OUTPATIENT_CLINIC_OR_DEPARTMENT_OTHER): Payer: Self-pay | Admitting: Obstetrics & Gynecology

## 2023-11-24 ENCOUNTER — Other Ambulatory Visit (HOSPITAL_BASED_OUTPATIENT_CLINIC_OR_DEPARTMENT_OTHER): Payer: Self-pay | Admitting: Certified Nurse Midwife

## 2023-11-24 DIAGNOSIS — F4322 Adjustment disorder with anxiety: Secondary | ICD-10-CM

## 2023-11-24 DIAGNOSIS — F419 Anxiety disorder, unspecified: Secondary | ICD-10-CM

## 2023-11-24 MED ORDER — SERTRALINE HCL 50 MG PO TABS: 50.0000 mg | ORAL_TABLET | Freq: Every day | ORAL | 1 refills | Status: AC

## 2023-11-24 MED ORDER — SERTRALINE HCL 25 MG PO TABS
25.0000 mg | ORAL_TABLET | Freq: Every day | ORAL | 3 refills | Status: AC
Start: 2023-11-24 — End: ?

## 2023-11-29 DIAGNOSIS — N3000 Acute cystitis without hematuria: Secondary | ICD-10-CM | POA: Diagnosis not present

## 2024-02-16 DIAGNOSIS — R059 Cough, unspecified: Secondary | ICD-10-CM | POA: Diagnosis not present

## 2024-02-16 DIAGNOSIS — J019 Acute sinusitis, unspecified: Secondary | ICD-10-CM | POA: Diagnosis not present

## 2024-03-09 DIAGNOSIS — R0602 Shortness of breath: Secondary | ICD-10-CM | POA: Diagnosis not present

## 2024-03-09 DIAGNOSIS — R0789 Other chest pain: Secondary | ICD-10-CM | POA: Diagnosis not present

## 2024-03-10 ENCOUNTER — Emergency Department (HOSPITAL_COMMUNITY)

## 2024-03-10 ENCOUNTER — Other Ambulatory Visit: Payer: Self-pay

## 2024-03-10 ENCOUNTER — Emergency Department (HOSPITAL_COMMUNITY)
Admission: EM | Admit: 2024-03-10 | Discharge: 2024-03-10 | Disposition: A | Discharge status: Home / Self Care | Source: Ambulatory Visit | Attending: Emergency Medicine | Admitting: Emergency Medicine

## 2024-03-10 DIAGNOSIS — R791 Abnormal coagulation profile: Secondary | ICD-10-CM | POA: Diagnosis not present

## 2024-03-10 DIAGNOSIS — Z91040 Latex allergy status: Secondary | ICD-10-CM | POA: Insufficient documentation

## 2024-03-10 DIAGNOSIS — R7989 Other specified abnormal findings of blood chemistry: Secondary | ICD-10-CM | POA: Insufficient documentation

## 2024-03-10 DIAGNOSIS — R0600 Dyspnea, unspecified: Secondary | ICD-10-CM | POA: Insufficient documentation

## 2024-03-10 DIAGNOSIS — R000 Tachycardia, unspecified: Secondary | ICD-10-CM | POA: Diagnosis not present

## 2024-03-10 DIAGNOSIS — R0602 Shortness of breath: Secondary | ICD-10-CM | POA: Diagnosis not present

## 2024-03-10 LAB — I-STAT CHEM 8, ED
BUN: 10 mg/dL (ref 6–20)
Calcium, Ion: 1.17 mmol/L (ref 1.15–1.40)
Chloride: 107 mmol/L (ref 98–111)
Creatinine, Ser: 0.9 mg/dL (ref 0.44–1.00)
Glucose, Bld: 102 mg/dL — ABNORMAL HIGH (ref 70–99)
HCT: 45 % (ref 36.0–46.0)
Hemoglobin: 15.3 g/dL — ABNORMAL HIGH (ref 12.0–15.0)
Potassium: 3.7 mmol/L (ref 3.5–5.1)
Sodium: 139 mmol/L (ref 135–145)
TCO2: 21 mmol/L — ABNORMAL LOW (ref 22–32)

## 2024-03-10 LAB — CBC
HCT: 43.6 % (ref 36.0–46.0)
Hemoglobin: 14.5 g/dL (ref 12.0–15.0)
MCH: 30.1 pg (ref 26.0–34.0)
MCHC: 33.3 g/dL (ref 30.0–36.0)
MCV: 90.6 fL (ref 80.0–100.0)
Platelets: 334 K/uL (ref 150–400)
RBC: 4.81 MIL/uL (ref 3.87–5.11)
RDW: 14.8 % (ref 11.5–15.5)
WBC: 8.4 K/uL (ref 4.0–10.5)
nRBC: 0 % (ref 0.0–0.2)

## 2024-03-10 MED ORDER — IOHEXOL 350 MG/ML SOLN
75.0000 mL | Freq: Once | INTRAVENOUS | Status: AC | PRN
  Administered 2024-03-10: 75 mL via INTRAVENOUS

## 2024-03-10 NOTE — Discharge Instructions (Addendum)
 You were evaluated here in the emergency department today for shortness of breath and elevated D-dimer The CT of your chest does not show any evidence of blood clot in your lungs.  However, the elevated D-dimer could represent blood clot elsewhere.  There are no obvious findings on physical exam to suggest that you have a blood clot somewhere else. Please return if you are having any new or worsening symptoms Please follow-up with your primary care doctor as soon as possible

## 2024-03-10 NOTE — ED Provider Notes (Signed)
 [place] EMERGENCY DEPARTMENT AT [facility] Provider Note   CSN: [id number] Arrival date & time: 03/10/24  0840     Patient presents with: Shortness of Breath   Chloe Graves is a 33 y.o. female.   HPI 33 year old female history of unprovoked PE, seen at urgent care yesterday for nasal congestion, cough, and shortness of breath.  Urgent care called her and told her her D-dimer was high and to come to the ED.  She reports that she has had some nasal congestion and some cough over the past month.  She has been more dyspneic over the past several days feeling dyspnea on exertion with climbing stairs.  She denies any pain, headache, chest pain, abdominal pain, nausea, vomiting, diarrhea, fever or chills currently    Prior to Admission medications   Medication Sig Start Date End Date Taking? Authorizing Provider  amoxicillin-clavulanate (AUGMENTIN) 875-125 MG tablet 1 tablet 2 (two) times daily. 02/16/24  Yes [provider]  benzonatate (TESSALON) 100 MG capsule Take 100 mg by mouth 3 (three) times daily as needed. 02/16/24  Yes [provider]  fluticasone (FLONASE) 50 MCG/ACT nasal spray Place 1 spray into both nostrils 2 (two) times daily. 02/16/24  Yes [provider]  Guaifenesin (MUCINEX MAXIMUM STRENGTH) 1200 MG TB12 Take 1 tablet by mouth daily as needed (cough).   Yes [provider]  loratadine (CLARITIN) 10 MG tablet Take 10 mg by mouth daily as needed for allergies.   Yes [provider]  sertraline  (ZOLOFT ) 25 MG tablet Take 1 tablet (25 mg total) by mouth daily. 11/24/23  Yes Lo, Arland POUR, CNM  sertraline  (ZOLOFT ) 50 MG tablet Take 1 tablet (50 mg total) by mouth daily. 11/24/23  Yes Lo, Arland POUR, CNM    Allergies: Latex    Review of Systems  Updated Vital Signs BP 126/61 (BP Location: Right Arm)   Pulse 94   Temp 99.1 F (37.3 C) (Oral)   Resp 18   Ht 1.575 m (5' 2)   Wt 108.9 kg   LMP 08/08/2023  (Approximate)   SpO2 95%   BMI 43.90 kg/m   Physical Exam Vitals reviewed.  Constitutional:      General: She is not in acute distress.    Appearance: She is well-developed.  HENT:     Head: Normocephalic.     Mouth/Throat:     Mouth: Mucous membranes are moist.  Eyes:     Pupils: Pupils are equal, round, and reactive to light.  Cardiovascular:     Rate and Rhythm: Normal rate and regular rhythm.  Pulmonary:     Effort: Pulmonary effort is normal.     Breath sounds: Normal breath sounds.  Chest:     Chest wall: No mass.  Abdominal:     Palpations: Abdomen is soft.  Musculoskeletal:        General: Normal range of motion.     Cervical back: Normal range of motion.     Right lower leg: No tenderness.     Left lower leg: No tenderness.  Skin:    General: Skin is warm and dry.     Capillary Refill: Capillary refill takes less than 2 seconds.  Neurological:     General: No focal deficit present.     Mental Status: She is alert.  Psychiatric:        Mood and Affect: Mood normal.     (all labs ordered are listed, but only abnormal results are  displayed) Labs Reviewed  I-STAT CHEM 8, ED - Abnormal; Notable for the following components:      Result Value   Glucose, Bld 102 (*)    TCO2 21 (*)    Hemoglobin 15.3 (*)    All other components within normal limits  CBC    EKG: None  Radiology: CT Angio Chest PE W and/or Wo Contrast Result Date: 03/10/2024 CLINICAL DATA:  Shortness of breath. EXAM: CT ANGIOGRAPHY CHEST WITH CONTRAST TECHNIQUE: Multidetector CT imaging of the chest was performed using the standard protocol during bolus administration of intravenous contrast. Multiplanar CT image reconstructions and MIPs were obtained to evaluate the vascular anatomy. RADIATION DOSE REDUCTION: This exam was performed according to the departmental dose-optimization program which includes automated exposure control, adjustment of the mA and/or kV according to patient size and/or  use of iterative reconstruction technique. CONTRAST:  75mL OMNIPAQUE  IOHEXOL  350 MG/ML SOLN COMPARISON:  10/19/2022 FINDINGS: Cardiovascular: Normal-sized heart when a poor inspiration is taken into account. Normally opacified pulmonary arteries with no pulmonary arterial filling defects seen. No pericardial effusion. Mediastinum/Nodes: No enlarged mediastinal, hilar, or axillary lymph nodes. Thyroid  gland, trachea, and esophagus demonstrate no significant findings. Lungs/Pleura: Minimal bilateral dependent atelectasis. Upper Abdomen: Unremarkable. Musculoskeletal: Unremarkable bones. Review of the MIP images confirms the above findings. IMPRESSION: No pulmonary emboli or other acute abnormality. Electronically Signed   By: Elspeth Bathe M.D.   On: 03/10/2024 11:02     Procedures   Medications Ordered in the ED  iohexol  (OMNIPAQUE ) 350 MG/ML injection 75 mL (75 mLs Intravenous Contrast Given 03/10/24 1036)                                    Medical Decision Making Amount and/or Complexity of Data Reviewed Labs: ordered. Radiology: ordered.  Risk Prescription drug management.    Reviewed D-dimer from outside facility.  D-dimer was 1690 ng/mL from the [facility] pathology labs with range upper limit of 64 ng/mL  33 year old female presents today with reports of elevated D-dimer.  She was seen at urgent care yesterday due to some cough and congestion she thought she might have sinus infection and wanted to have chest x-Trong Gosling.  At that time, they sent COVID, flu, both negative, and D-dimer which was elevated.  Patient has previously had a PE, she completed Eliquis  for 6 months and Eliquis  was then discontinued 1 dyspnea patient with high risk for PE with prior unprovoked PE.  Patient has been off of Eliquis  for almost 10 months.  She reports no leg swelling.  Based on prior history and risk factors we will proceed directly to CTA. CTA of chest is negative.  No other obvious findings  on CTA to explain patient's dyspnea.  We did discuss that her D-dimer is elevated.  She has no signs of other clot.  We discussed return precautions and need for follow-up with her primary care physician and she voices understanding 2 patient with history of anemia in setting of menorrhagia and iron  deficiency and fibroid uterus.  Patient has had definitive surgical treatment with [facility] April 2025.  With last hemoglobin 14.2  Patient reevaluated and appears stable for discharge.  Discussed follow-up and return precautions.    Final diagnoses:  Elevated d-dimer  Dyspnea, unspecified type    ED Discharge Orders     None          Levander Houston, MD 03/10/24 1205

## 2024-03-10 NOTE — ED Notes (Signed)
 Dr. Levander at bedside now.

## 2024-03-10 NOTE — ED Triage Notes (Signed)
 Pt. Stated, Im here because the UC had called and said my D-Dimer came back high. Ive been SOB for 3-4  days and Ive had sinus infection and treated with antibiotics but still have some symptoms. Im making sure I dont have a PE

## 2024-03-10 NOTE — ED Notes (Signed)
 Extra Red, Blue, Sst & DG drawn

## 2024-03-16 ENCOUNTER — Ambulatory Visit (INDEPENDENT_AMBULATORY_CARE_PROVIDER_SITE_OTHER)

## 2024-03-16 ENCOUNTER — Encounter (HOSPITAL_BASED_OUTPATIENT_CLINIC_OR_DEPARTMENT_OTHER): Payer: Self-pay

## 2024-03-16 ENCOUNTER — Other Ambulatory Visit (HOSPITAL_COMMUNITY)
Admission: RE | Admit: 2024-03-16 | Discharge: 2024-03-16 | Disposition: A | Discharge status: Home / Self Care | Source: Ambulatory Visit | Attending: Obstetrics & Gynecology | Admitting: Obstetrics & Gynecology

## 2024-03-16 VITALS — BP 137/82 | HR 85 | Wt 253.0 lb

## 2024-03-16 DIAGNOSIS — Z113 Encounter for screening for infections with a predominantly sexual mode of transmission: Secondary | ICD-10-CM | POA: Insufficient documentation

## 2024-03-16 NOTE — Progress Notes (Signed)
 NURSE VISIT- VAGINITIS/STD/POC  SUBJECTIVE:  Chloe Graves is a 33 y.o. [id number] GYN patient female here for a vaginal swab for STD screen.  She reports the following symptoms: none. Would also like to have lab work for additional STD testing. Denies abnormal vaginal bleeding, significant pelvic pain, fever, or UTI symptoms.  OBJECTIVE:  BP 137/82 (BP Location: Right Arm, Patient Position: Sitting, Cuff Size: Large)   Pulse 85   Wt 253 lb (114.8 kg)   LMP 08/08/2023 (Approximate)   SpO2 100%   BMI 46.27 kg/m   Appears well, in no apparent distress  ASSESSMENT: Vaginal swab for STD screen  PLAN: Self-collected vaginal probe for Gonorrhea, Chlamydia, Trichomonas sent to lab. After review with Arland Roller, CNM blood work for RPR, hep C, hep B, and HIV ordered to be done today.  Treatment: to be determined once results are received Follow-up as needed.

## 2024-03-17 LAB — HEPATITIS B SURFACE ANTIGEN: Hepatitis B Surface Ag: NEGATIVE

## 2024-03-17 LAB — HIV ANTIBODY (ROUTINE TESTING W REFLEX): HIV Screen 4th Generation wRfx: NONREACTIVE

## 2024-03-17 LAB — RPR: RPR Ser Ql: NONREACTIVE

## 2024-03-17 LAB — HEPATITIS C ANTIBODY: Hep C Virus Ab: NONREACTIVE

## 2024-03-18 ENCOUNTER — Ambulatory Visit (HOSPITAL_BASED_OUTPATIENT_CLINIC_OR_DEPARTMENT_OTHER): Payer: Self-pay | Admitting: Certified Nurse Midwife

## 2024-03-22 LAB — CERVICOVAGINAL ANCILLARY ONLY
Bacterial Vaginitis (gardnerella): POSITIVE — AB
Candida Glabrata: NEGATIVE
Candida Vaginitis: NEGATIVE
Chlamydia: NEGATIVE
Comment: NEGATIVE
Comment: NEGATIVE
Comment: NEGATIVE
Comment: NEGATIVE
Comment: NEGATIVE
Comment: NORMAL
Neisseria Gonorrhea: NEGATIVE
Trichomonas: NEGATIVE

## 2024-03-22 MED ORDER — METRONIDAZOLE 500 MG PO TABS: 500.0000 mg | ORAL_TABLET | Freq: Two times a day (BID) | ORAL | 1 refills | Status: AC

## 2024-03-25 ENCOUNTER — Encounter: Admitting: Family Medicine

## 2024-04-08 ENCOUNTER — Other Ambulatory Visit (HOSPITAL_BASED_OUTPATIENT_CLINIC_OR_DEPARTMENT_OTHER): Payer: Self-pay | Admitting: Certified Nurse Midwife

## 2024-04-08 MED ORDER — CLINDAMYCIN HCL 300 MG PO CAPS: 300.0000 mg | ORAL_CAPSULE | Freq: Two times a day (BID) | ORAL | 0 refills | Status: AC

## 2024-11-15 ENCOUNTER — Inpatient Hospital Stay

## 2024-11-15 ENCOUNTER — Inpatient Hospital Stay: Admitting: Hematology and Oncology
